# Patient Record
Sex: Female | Born: 1940
Health system: Southern US, Community
[De-identification: ages and names within clinical notes are randomized; demographics above are authoritative.]

## PROBLEM LIST (undated history)

## (undated) DIAGNOSIS — J449 Chronic obstructive pulmonary disease, unspecified: Secondary | ICD-10-CM

## (undated) DIAGNOSIS — I10 Essential (primary) hypertension: Secondary | ICD-10-CM

## (undated) DIAGNOSIS — K219 Gastro-esophageal reflux disease without esophagitis: Secondary | ICD-10-CM

## (undated) HISTORY — PX: CHOLECYSTECTOMY: SHX55

## (undated) HISTORY — DX: Chronic obstructive pulmonary disease, unspecified: J44.9

## (undated) HISTORY — PX: CYSTECTOMY: SUR359

## (undated) HISTORY — PX: KNEE ARTHROSCOPY: SUR90

## (undated) HISTORY — DX: Essential (primary) hypertension: I10

## (undated) HISTORY — DX: Gastro-esophageal reflux disease without esophagitis: K21.9

---

## 2005-03-12 ENCOUNTER — Ambulatory Visit: Payer: Self-pay | Admitting: Family Medicine

## 2006-02-06 ENCOUNTER — Ambulatory Visit: Payer: Self-pay | Admitting: Family Medicine

## 2006-02-13 ENCOUNTER — Ambulatory Visit: Payer: Self-pay | Admitting: Family Medicine

## 2007-03-05 ENCOUNTER — Ambulatory Visit: Payer: Self-pay | Admitting: Family Medicine

## 2011-02-12 ENCOUNTER — Encounter: Payer: Self-pay | Admitting: Pulmonary Disease

## 2011-02-20 ENCOUNTER — Encounter: Payer: Self-pay | Admitting: Pulmonary Disease

## 2011-02-21 ENCOUNTER — Encounter: Payer: Self-pay | Admitting: Pulmonary Disease

## 2011-02-21 ENCOUNTER — Ambulatory Visit (INDEPENDENT_AMBULATORY_CARE_PROVIDER_SITE_OTHER): Payer: Medicare Other | Admitting: Pulmonary Disease

## 2011-02-21 ENCOUNTER — Ambulatory Visit (INDEPENDENT_AMBULATORY_CARE_PROVIDER_SITE_OTHER)
Admission: RE | Admit: 2011-02-21 | Discharge: 2011-02-21 | Disposition: A | Discharge status: Home / Self Care | Payer: Medicare Other | Source: Ambulatory Visit | Attending: Pulmonary Disease | Admitting: Pulmonary Disease

## 2011-02-21 VITALS — BP 120/82 | HR 95 | Temp 97.9°F | Ht 66.0 in | Wt 203.0 lb

## 2011-02-21 DIAGNOSIS — R0609 Other forms of dyspnea: Secondary | ICD-10-CM

## 2011-02-21 DIAGNOSIS — J439 Emphysema, unspecified: Secondary | ICD-10-CM | POA: Insufficient documentation

## 2011-02-21 DIAGNOSIS — R0989 Other specified symptoms and signs involving the circulatory and respiratory systems: Secondary | ICD-10-CM

## 2011-02-21 NOTE — Assessment & Plan Note (Signed)
The pt has worsening doe of unknown origin.  I suspect she does have some element of obstructive lung disease, and will need pfts to establish the diagnosis and the severity.  I also think weight and conditioning are playing roles as well.  If she has very little copd by pfts, will have to consider whether she may have underlying cardiac disease contributing to this.  I have asked her to continue with her current inhaler regimen, and will check cxr today and schedule for pfts.

## 2011-02-21 NOTE — Progress Notes (Signed)
  Subjective:    Patient ID: Danielle Harmon, female    DOB: 06-19-1941, 70 y.o.   MRN: [id number]  HPI The pt is a 70y/o female who I have been asked to see for evaluation of doe.  The pt states she has had breathing issues for greater than 52yrs, but has noted worsening symptoms the past 6mos.  She describes a less than one block doe at moderate pace, and will get winded bringing groceries in from the car and with light housework.  She has minimal cough or congestion, and only mild chronic LE edema.  She has never had a cardiac evaluation in the past.  She has a long history of smoking, but has not done so in 28yrs.  She has not had a recent cxr or pfts.  Her weight has been stable over the last few yrs.  She is currently on spiriva and symbicort, and feels the inhalers have helped.    Review of Systems  Constitutional: Negative for fever and unexpected weight change.  HENT: Positive for sneezing. Negative for ear pain, nosebleeds, congestion, sore throat, rhinorrhea, trouble swallowing, dental problem, postnasal drip and sinus pressure.   Eyes: Negative for redness and itching.  Respiratory: Positive for cough and shortness of breath. Negative for chest tightness and wheezing.   Cardiovascular: Positive for leg swelling. Negative for palpitations.  Gastrointestinal: Negative for nausea and vomiting.  Genitourinary: Negative for dysuria.  Musculoskeletal: Positive for joint swelling.  Skin: Negative for rash.  Neurological: Negative for headaches.  Hematological: Does not bruise/bleed easily.  Psychiatric/Behavioral: Negative for dysphoric mood. The patient is not nervous/anxious.        Objective:   Physical Exam Constitutional:  Obese female, no acute distress  HENT:  Nares patent without discharge  Oropharynx without exudate, palate and uvula are normal  Eyes:  Perrla, eomi, no scleral icterus  Neck:  No JVD, no TMG  Cardiovascular:  Normal rate, regular rhythm, no rubs or gallops.   No murmurs        Intact distal pulses  Pulmonary : decreased  breath sounds, no stridor or respiratory distress   No rales, rhonchi, or wheezing  Abdominal:  Soft, nondistended, bowel sounds present.  No tenderness noted.   Musculoskeletal: mild lower extremity edema noted.  Lymph Nodes:  No cervical lymphadenopathy noted  Skin:  No cyanosis noted  Neurologic:  Alert, appropriate, moves all 4 extremities without obvious deficit.         Assessment & Plan:

## 2011-02-21 NOTE — Patient Instructions (Signed)
Will schedule for breathing tests and see you back on same day to discuss. Will check cxr today and call you with results Continue on current inhalers for now

## 2011-02-24 ENCOUNTER — Encounter: Payer: Self-pay | Admitting: Pulmonary Disease

## 2011-02-26 ENCOUNTER — Encounter (HOSPITAL_COMMUNITY): Payer: Medicare Other

## 2011-03-01 ENCOUNTER — Ambulatory Visit (INDEPENDENT_AMBULATORY_CARE_PROVIDER_SITE_OTHER): Payer: Medicare Other | Admitting: Pulmonary Disease

## 2011-03-01 ENCOUNTER — Ambulatory Visit (HOSPITAL_COMMUNITY)
Admission: RE | Admit: 2011-03-01 | Discharge: 2011-03-01 | Disposition: A | Discharge status: Home / Self Care | Payer: Medicare Other | Source: Ambulatory Visit | Attending: Pulmonary Disease | Admitting: Pulmonary Disease

## 2011-03-01 ENCOUNTER — Encounter: Payer: Self-pay | Admitting: Pulmonary Disease

## 2011-03-01 VITALS — BP 116/74 | HR 86 | Temp 97.6°F | Ht 66.0 in | Wt 200.0 lb

## 2011-03-01 DIAGNOSIS — Z79899 Other long term (current) drug therapy: Secondary | ICD-10-CM | POA: Insufficient documentation

## 2011-03-01 DIAGNOSIS — R0989 Other specified symptoms and signs involving the circulatory and respiratory systems: Secondary | ICD-10-CM | POA: Insufficient documentation

## 2011-03-01 DIAGNOSIS — R0609 Other forms of dyspnea: Secondary | ICD-10-CM | POA: Insufficient documentation

## 2011-03-01 DIAGNOSIS — J988 Other specified respiratory disorders: Secondary | ICD-10-CM | POA: Insufficient documentation

## 2011-03-01 DIAGNOSIS — J449 Chronic obstructive pulmonary disease, unspecified: Secondary | ICD-10-CM

## 2011-03-01 DIAGNOSIS — J438 Other emphysema: Secondary | ICD-10-CM | POA: Insufficient documentation

## 2011-03-01 DIAGNOSIS — R0602 Shortness of breath: Secondary | ICD-10-CM | POA: Insufficient documentation

## 2011-03-01 LAB — PULMONARY FUNCTION TEST

## 2011-03-01 NOTE — Patient Instructions (Signed)
Stay on your current inhaler regimen. Work on weight loss, and I will refer you to the pulmonary rehab program at [facility] followup with me in 4mos, or sooner if having issues.

## 2011-03-01 NOTE — Assessment & Plan Note (Signed)
The pt has severe obstructive disease on her pfts, but at least she has quit smoking.  She is currently on a very good bronchodilator regimen, and I have asked her to continue.  I suspect the key to improving her QOL is weight loss and conditioning.  I have suggested the pulmonary rehab program at [facility] or [place], and she is agreeable.

## 2011-03-01 NOTE — Progress Notes (Signed)
SATURATION QUALIFICATIONS:  Patient Saturations on Room Air at Rest = 94%  Patient Saturations on Room Air while Ambulating = 86%  Patient Saturations on 2 Liters of oxygen while Ambulating = 92% Arman Filter, LPN

## 2011-03-01 NOTE — Progress Notes (Signed)
  Subjective:    Patient ID: Danielle Harmon, female    DOB: May 05, 1941, 70 y.o.   MRN: [id number]  HPI The pt comes in today for f/u of her recent pfts.  She was found to have severe obstructive disease, but no restriction.  She is continuing on her current brochodilator regimen.  She denies cough or congestion currently.     Review of Systems  Constitutional: Negative for fever and unexpected weight change.  HENT: Positive for rhinorrhea. Negative for ear pain, nosebleeds, congestion, sore throat, sneezing, trouble swallowing, dental problem, postnasal drip and sinus pressure.   Eyes: Negative for redness and itching.  Respiratory: Positive for cough and shortness of breath. Negative for chest tightness and wheezing.   Cardiovascular: Negative for palpitations and leg swelling.  Gastrointestinal: Negative for nausea and vomiting.  Genitourinary: Negative for dysuria.  Musculoskeletal: Positive for joint swelling.  Skin: Negative for rash.  Neurological: Negative for headaches.  Hematological: Bruises/bleeds easily.  Psychiatric/Behavioral: Negative for dysphoric mood. The patient is not nervous/anxious.        Objective:   Physical Exam Obese female in nad Chest with decreased bs, no wheezing Cor with rrr LE with mild edema, no cyanosis  Alert, oriented , moves all 4        Assessment & Plan:

## 2011-03-12 ENCOUNTER — Ambulatory Visit: Payer: Medicare Other | Admitting: Pulmonary Disease

## 2011-03-21 ENCOUNTER — Encounter: Payer: Self-pay | Admitting: Pulmonary Disease

## 2011-07-03 ENCOUNTER — Encounter: Payer: Self-pay | Admitting: Pulmonary Disease

## 2011-07-03 ENCOUNTER — Ambulatory Visit (INDEPENDENT_AMBULATORY_CARE_PROVIDER_SITE_OTHER): Payer: Medicare Other | Admitting: Pulmonary Disease

## 2011-07-03 VITALS — BP 118/80 | HR 77 | Temp 97.6°F | Ht 66.0 in | Wt 195.2 lb

## 2011-07-03 DIAGNOSIS — J449 Chronic obstructive pulmonary disease, unspecified: Secondary | ICD-10-CM

## 2011-07-03 NOTE — Assessment & Plan Note (Signed)
The patient is doing very well from a COPD standpoint on her current bronchodilator regimen.  She is participating in pulmonary rehabilitation, and has seen improvement in her functional status.  She has not had a recent acute exacerbation.  She does notice some increase in shortness of breath with activity toward the late afternoon, and thinks it is related to her medication "wearing off".  I have told her that she can take her symbicort at that time, and that she does not have to wait the full 12 hours.  I have asked her to continue working on her exercise program, and to work aggressively on weight loss.

## 2011-07-03 NOTE — Patient Instructions (Signed)
Stay on current medications Continue with exercise program, work on weight loss followup with me in 6mos.

## 2011-07-03 NOTE — Progress Notes (Signed)
  Subjective:    Patient ID: Danielle Harmon, female    DOB: 1941-07-05, 70 y.o.   MRN: [id number]  HPI The patient comes in today for followup of her known COPD.  She has been compliant with her medications, and has been participating in the pulmonary rehabilitation program.  She denies any significant cough or mucus production, and overall feels that her functional capacity has improved greatly.  She has been monitored closely during her rehabilitation sessions, and has not required oxygen with exertion.  She has received her flu shot already this fall.   Review of Systems  Constitutional: Negative for fever and unexpected weight change.  HENT: Negative for ear pain, nosebleeds, congestion, sore throat, rhinorrhea, sneezing, trouble swallowing, dental problem, postnasal drip and sinus pressure.   Eyes: Positive for itching. Negative for redness.  Respiratory: Positive for cough and shortness of breath. Negative for chest tightness and wheezing.   Cardiovascular: Positive for leg swelling. Negative for palpitations.  Gastrointestinal: Negative for nausea and vomiting.  Genitourinary: Negative for dysuria.  Musculoskeletal: Negative for joint swelling.  Skin: Negative for rash.  Neurological: Negative for headaches.  Hematological: Bruises/bleeds easily.  Psychiatric/Behavioral: Negative for dysphoric mood. The patient is not nervous/anxious.        Objective:   Physical Exam Obese female in nad Nose without purulence or discharge Chest with clear bs, no wheezing Cor with rrr LE with mild edema, no cyanosis Alert, oriented, moves all 4        Assessment & Plan:

## 2012-01-02 ENCOUNTER — Ambulatory Visit: Payer: Medicare Other | Admitting: Pulmonary Disease

## 2013-09-14 ENCOUNTER — Ambulatory Visit (INDEPENDENT_AMBULATORY_CARE_PROVIDER_SITE_OTHER): Payer: Medicare Other | Admitting: Pulmonary Disease

## 2013-09-14 ENCOUNTER — Encounter: Payer: Self-pay | Admitting: Pulmonary Disease

## 2013-09-14 VITALS — BP 142/78 | HR 93

## 2013-09-14 DIAGNOSIS — J449 Chronic obstructive pulmonary disease, unspecified: Secondary | ICD-10-CM

## 2013-09-14 MED ORDER — PREDNISONE 10 MG PO TABS: ORAL_TABLET | ORAL | Status: DC

## 2013-09-14 NOTE — Assessment & Plan Note (Signed)
The patient has seen a decline in her breathing since her last visit in 2012, despite being on a very good medical regimen. I think some of this is underlying deconditioning and weight, and I've discussed with her returning to pulmonary rehabilitation. She will give this consideration. She has not returned to baseline since an episode of what sounds like bronchitis around Thanksgiving. Will give her a short course of prednisone to see if this will help return her to a reasonable status. I've also discussed with her changing her maintenance inhalers over to nebulized medications. She is not ready to commit the time to this.

## 2013-09-14 NOTE — Addendum Note (Signed)
Addended by: Maisie Fus on: 09/14/2013 02:49 PM   Modules accepted: Orders

## 2013-09-14 NOTE — Progress Notes (Signed)
   Subjective:    Patient ID: Danielle Harmon, female    DOB: November 19, 1940, 73 y.o.   MRN: [id number]  HPI Patient comes in today for followup of her known severe COPD. She has not been seen in over 2 years, and is now on supplemental oxygen after an ER visit in 2013. She is maintained on her bronchodilator regimen, but her insurance is changing and will no longer cover Symbicort. Elwin Sleight is an option for her. She tells me that she had an episode around Thanksgiving of what sounds like bronchitis, and she has never returned to baseline. She has not coughing consistently, nor is she congested. Her exertional tolerance continues to be poor, and admits to having deconditioning. She has also been having lower extremity edema which is intermittent in nature.   Review of Systems  Constitutional: Negative for fever and unexpected weight change.  HENT: Negative for congestion, dental problem, ear pain, nosebleeds, postnasal drip, rhinorrhea, sinus pressure, sneezing, sore throat and trouble swallowing.   Eyes: Negative for redness and itching.  Respiratory: Positive for shortness of breath. Negative for cough, chest tightness and wheezing.   Cardiovascular: Negative for palpitations and leg swelling.  Gastrointestinal: Negative for nausea and vomiting.  Genitourinary: Negative for dysuria.  Musculoskeletal: Negative for joint swelling.  Skin: Negative for rash.  Neurological: Negative for headaches.  Hematological: Does not bruise/bleed easily.  Psychiatric/Behavioral: Negative for dysphoric mood. The patient is not nervous/anxious.        Objective:   Physical Exam Well-developed female in no acute distress Nose without purulence or discharge noted Neck without lymphadenopathy or thyromegaly Chest with very decreased breath sounds, a few rhonchi, no active wheezing Cardiac exam with regular rate and rhythm Lower extremities with 1+ edema, no cyanosis Alert and oriented, moves all 4  extremities.       Assessment & Plan:

## 2013-09-14 NOTE — Patient Instructions (Signed)
Will change your symbicort to dulera 100/5 after the first of the year.  Take sample 2 puffs am and pm everyday, and keep mouth rinsed.  If you tolerate, call us and we can send in prescription. Will treat with a short course of prednisone since you have not recovered from your recent respiratory infection. Think about referral to pulmonary rehab program again at [facility] or [place].  You did well the last time. Stay as active as possible. Keep up with your swelling in legs, and let primary know if getting worse.  followup with me again in 6mos.

## 2013-10-11 ENCOUNTER — Telehealth: Payer: Self-pay | Admitting: Pulmonary Disease

## 2013-10-11 MED ORDER — MOMETASONE FURO-FORMOTEROL FUM 100-5 MCG/ACT IN AERO: 2.0000 | INHALATION_SPRAY | Freq: Two times a day (BID) | RESPIRATORY_TRACT | Status: DC

## 2013-10-11 NOTE — Telephone Encounter (Signed)
Pt last seen 12.23.14 w/ KC: Patient Instructions     Will change your symbicort to dulera 100/5 after the first of the year. Take sample 2 puffs am and pm everyday, and keep mouth rinsed. If you tolerate, call us and we can send in prescription.  Will treat with a short course of prednisone since you have not recovered from your recent respiratory infection.  Think about referral to pulmonary rehab program again at [facility] or [place]. You did well the last time.  Stay as active as possible.  Keep up with your swelling in legs, and let primary know if getting worse.  followup with me again in 6mos.    Called spoke with patient who reported the Elwin SleightDulera is working well for her.  Pt denies any dyspnea, chest tightness, congestion, thrush, hoarseness.  Pt is requesting a 90day supply of Dulera to be [facility] [place].  Rx sent, pt aware to keep follow up in May/June.  Nothing further needed; will sign off.

## 2014-03-15 ENCOUNTER — Ambulatory Visit: Payer: Medicare Other | Admitting: Pulmonary Disease

## 2014-03-18 ENCOUNTER — Encounter: Payer: Self-pay | Admitting: Pulmonary Disease

## 2014-03-18 ENCOUNTER — Ambulatory Visit (INDEPENDENT_AMBULATORY_CARE_PROVIDER_SITE_OTHER): Payer: Medicare Other | Admitting: Pulmonary Disease

## 2014-03-18 VITALS — BP 122/80 | HR 83 | Temp 97.6°F | Ht 66.0 in | Wt 200.2 lb

## 2014-03-18 DIAGNOSIS — J438 Other emphysema: Secondary | ICD-10-CM

## 2014-03-18 NOTE — Assessment & Plan Note (Signed)
The patient is staying on her maintenance bronchodilator regimen, and feels that her breathing is little better than the last visit. She has not had any recent pulmonary infection or acute exacerbation. I have asked her to continue on her current meds, work on weight loss and conditioning, and to consider referral to the pulmonary rehabilitation program locally. I also stressed to her the importance of staying on her oxygen while sleeping and with exertional activities.

## 2014-03-18 NOTE — Patient Instructions (Signed)
Continue on current medications Stay as active as possible, and consider referral to the rehab program at [facility] Stay on oxygen with exertion and sleep.  followup with me again in 6mos

## 2014-03-18 NOTE — Progress Notes (Signed)
   Subjective:    Patient ID: Danielle Harmon, female    DOB: 1941-01-25, 73 y.o.   MRN: [id number]  HPI The patient comes in today for followup of her known COPD with chronic respiratory failure. She is using her maintenance inhalers consistently, and feels that her breathing has improved since last visit. She has had no pulmonary infection, nor has she had an acute exacerbation since last visit. She is staying on her oxygen compliantly at night, but not necessarily during the day. I have encouraged her to stay on this with exertional activities.   Review of Systems  Constitutional: Negative for fever and unexpected weight change.  HENT: Negative for congestion, dental problem, ear pain, nosebleeds, postnasal drip, rhinorrhea, sinus pressure, sneezing, sore throat and trouble swallowing.   Eyes: Negative for redness and itching.  Respiratory: Positive for cough and shortness of breath. Negative for chest tightness and wheezing.   Cardiovascular: Negative for palpitations and leg swelling.  Gastrointestinal: Negative for nausea and vomiting.  Genitourinary: Negative for dysuria.  Musculoskeletal: Negative for joint swelling.  Skin: Negative for rash.  Neurological: Negative for headaches.  Hematological: Does not bruise/bleed easily.  Psychiatric/Behavioral: Negative for dysphoric mood. The patient is not nervous/anxious.        Objective:   Physical Exam Obese female in no acute distress Nose without purulence or discharge noted Neck without lymphadenopathy or thyromegaly Chest with decreased breath sounds, no active wheezes or rhonchi Cardiac exam with regular rate and rhythm Lower extremities with 1-2+ edema, no cyanosis Alert and oriented, moves all 4 extremities.       Assessment & Plan:

## 2014-04-10 ENCOUNTER — Other Ambulatory Visit: Payer: Self-pay | Admitting: Pulmonary Disease

## 2014-06-01 DIAGNOSIS — K219 Gastro-esophageal reflux disease without esophagitis: Secondary | ICD-10-CM | POA: Insufficient documentation

## 2014-07-01 ENCOUNTER — Other Ambulatory Visit: Payer: Self-pay | Admitting: *Deleted

## 2014-07-01 MED ORDER — MOMETASONE FURO-FORMOTEROL FUM 100-5 MCG/ACT IN AERO: INHALATION_SPRAY | RESPIRATORY_TRACT | Status: DC

## 2014-07-04 ENCOUNTER — Telehealth: Payer: Self-pay | Admitting: Pulmonary Disease

## 2014-07-04 MED ORDER — MOMETASONE FURO-FORMOTEROL FUM 100-5 MCG/ACT IN AERO: INHALATION_SPRAY | RESPIRATORY_TRACT | Status: DC

## 2014-07-04 NOTE — Telephone Encounter (Signed)
Called pt. She needs [facility]. RX has been sent. Nothing further needed

## 2014-09-19 ENCOUNTER — Ambulatory Visit: Payer: Medicare Other | Admitting: Pulmonary Disease

## 2014-09-27 ENCOUNTER — Encounter: Payer: Self-pay | Admitting: Pulmonary Disease

## 2014-09-27 ENCOUNTER — Ambulatory Visit (INDEPENDENT_AMBULATORY_CARE_PROVIDER_SITE_OTHER): Payer: Medicare Other | Admitting: Pulmonary Disease

## 2014-09-27 VITALS — BP 152/86 | HR 87 | Temp 97.6°F | Ht 66.0 in | Wt 199.2 lb

## 2014-09-27 DIAGNOSIS — J438 Other emphysema: Secondary | ICD-10-CM

## 2014-09-27 MED ORDER — ALBUTEROL SULFATE (2.5 MG/3ML) 0.083% IN NEBU: 2.5000 mg | INHALATION_SOLUTION | Freq: Four times a day (QID) | RESPIRATORY_TRACT | Status: DC | PRN

## 2014-09-27 NOTE — Assessment & Plan Note (Signed)
The patient seems to be doing fairly well from a COPD standpoint. She is staying on her bronchodilator regimen, and although she did have a recent chest cold, it responded well to antibiotics with no complications. She feels that she is back to baseline. I have asked her to stay on her current medications as well as oxygen, and to stay as active as possible. She tells me they are opening some kind of rehabilitation program and her home city, and I am happy to refer her when this becomes available. She is not able to travel to [place] for formal pulmonary rehabilitation.

## 2014-09-27 NOTE — Progress Notes (Deleted)
   Subjective:    Patient ID: Danielle Harmon, female    DOB: 1940/10/28, 74 y.o.   MRN: [id number]  HPI    Review of Systems  Constitutional: Negative for fever and unexpected weight change.  HENT: Negative for congestion, dental problem, ear pain, nosebleeds, postnasal drip, rhinorrhea, sinus pressure, sneezing, sore throat and trouble swallowing.   Eyes: Negative for redness and itching.  Respiratory: Negative for cough, chest tightness, shortness of breath and wheezing.   Cardiovascular: Negative for palpitations and leg swelling.  Gastrointestinal: Negative for nausea and vomiting.  Genitourinary: Negative for dysuria.  Musculoskeletal: Negative for joint swelling.  Skin: Negative for rash.  Neurological: Negative for headaches.  Hematological: Does not bruise/bleed easily.  Psychiatric/Behavioral: Negative for dysphoric mood. The patient is not nervous/anxious.        Objective:   Physical Exam        Assessment & Plan:

## 2014-09-27 NOTE — Patient Instructions (Signed)
No change in breathing medications Stay on oxygen Try to stay as active as possible. Will send in prescription for your albuterol solution for your nebulizer machine.   followup with me again in 6mos

## 2014-09-27 NOTE — Progress Notes (Signed)
   Subjective:    Patient ID: Danielle Harmon, female    DOB: [birth year], 74 y.o.   MRN: [id number]  HPI Patient comes in today for follow-up of her known COPD with chronic respiratory failure. She is staying on her bronchodilator regimen compliantly, as well as her oxygen. She did have a recent chest cold, but this resolved very quickly with antibiotics from her primary care physician. She feels that her breathing is at her usual baseline.   Review of Systems  Constitutional: Negative for fever and unexpected weight change.  HENT: Negative for congestion, dental problem, ear pain, nosebleeds, postnasal drip, rhinorrhea, sinus pressure, sneezing, sore throat and trouble swallowing.   Eyes: Negative for redness and itching.  Respiratory: Negative for cough, chest tightness, shortness of breath and wheezing.   Cardiovascular: Negative for palpitations and leg swelling.  Gastrointestinal: Negative for nausea and vomiting.  Genitourinary: Negative for dysuria.  Musculoskeletal: Negative for joint swelling.  Skin: Negative for rash.  Neurological: Negative for headaches.  Hematological: Does not bruise/bleed easily.  Psychiatric/Behavioral: Negative for dysphoric mood. The patient is not nervous/anxious.        Objective:   Physical Exam Well-developed female in no acute distress Nose without purulence or discharge noted Neck without lymphadenopathy or thyromegaly Chest with decreased breath sounds, no active wheezing Cardiac exam with regular rate and rhythm Lower extremities with 1-2+ edema, no cyanosis Alert and oriented, moves all 4 extremities.       Assessment & Plan:

## 2015-03-02 ENCOUNTER — Ambulatory Visit (INDEPENDENT_AMBULATORY_CARE_PROVIDER_SITE_OTHER): Payer: Medicare Other | Admitting: Pulmonary Disease

## 2015-03-02 ENCOUNTER — Encounter: Payer: Self-pay | Admitting: Pulmonary Disease

## 2015-03-02 VITALS — BP 124/72 | HR 73 | Temp 97.2°F | Ht 66.0 in | Wt 199.6 lb

## 2015-03-02 DIAGNOSIS — J438 Other emphysema: Secondary | ICD-10-CM | POA: Diagnosis not present

## 2015-03-02 NOTE — Progress Notes (Signed)
   Subjective:    Patient ID: Danielle Harmon, female    DOB: 07-03-1941, 74 y.o.   MRN: [id number]  HPI The patient comes in today for follow-up of her known severe COPD with chronic respiratory failure. She is staying on her bronchodilator regimen, and has not had a recent acute exacerbation. She feels that her breathing is stable from the last visit, and has not had to use her rescue inhaler excessively. She denies any chest congestion or purulent mucus.   Review of Systems  Constitutional: Negative for fever and unexpected weight change.  HENT: Negative for congestion, dental problem, ear pain, nosebleeds, postnasal drip, rhinorrhea, sinus pressure, sneezing, sore throat and trouble swallowing.   Eyes: Negative for redness and itching.  Respiratory: Positive for cough and shortness of breath. Negative for chest tightness and wheezing.   Cardiovascular: Negative for palpitations and leg swelling.  Gastrointestinal: Negative for nausea and vomiting.  Genitourinary: Negative for dysuria.  Musculoskeletal: Negative for joint swelling.  Skin: Negative for rash.  Neurological: Negative for headaches.  Hematological: Does not bruise/bleed easily.  Psychiatric/Behavioral: Negative for dysphoric mood. The patient is not nervous/anxious.        Objective:   Physical Exam Well-developed female in no acute distress Nose without purulence or discharge noted Neck without lymphadenopathy or thyromegaly Chest with decreased breath sounds, no active wheezing Cardiac exam with regular rate and rhythm Lower extremities with mild edema, no cyanosis Alert and oriented, moves all 4 extremities.       Assessment & Plan:

## 2015-03-02 NOTE — Assessment & Plan Note (Signed)
The patient appears to be stable from a COPD standpoint on her current bronchodilator regimen and oxygen. She has not had a recent acute exacerbation or pulmonary infection, and feels that her exertional tolerance is at baseline. I have asked her to continue on her current medications, and to try and stay as active as possible.

## 2015-03-02 NOTE — Patient Instructions (Signed)
No change in breathing medications. Continue to stay on oxygen, and work on as much activity as possible followup with Dr. Delton Coombes in 14mos.

## 2015-03-28 ENCOUNTER — Ambulatory Visit: Payer: Medicare Other | Admitting: Pulmonary Disease

## 2015-10-10 ENCOUNTER — Ambulatory Visit (INDEPENDENT_AMBULATORY_CARE_PROVIDER_SITE_OTHER): Payer: Medicare Other | Admitting: Emergency Medicine

## 2015-10-10 ENCOUNTER — Encounter: Payer: Self-pay | Admitting: Emergency Medicine

## 2015-10-10 VITALS — BP 118/82 | HR 79 | Ht 66.0 in | Wt 199.0 lb

## 2015-10-10 DIAGNOSIS — Z23 Encounter for immunization: Secondary | ICD-10-CM

## 2015-10-10 DIAGNOSIS — J438 Other emphysema: Secondary | ICD-10-CM | POA: Diagnosis not present

## 2015-10-10 NOTE — Patient Instructions (Signed)
Please continue your inhaled meds as you have been taking them  We will give you Prevnar 13 immunization today Wear your oxygen with all exertion and at night.  Follow with Dr Delton Coombes in 6 months or sooner if you have any problems

## 2015-10-10 NOTE — Assessment & Plan Note (Signed)
Please continue your inhaled meds as you have been taking them  We will give you Prevnar 13 immunization today Wear your oxygen with all exertion and at night.  Follow with Dr Bryannah Boston in 6 months or sooner if you have any problems  

## 2015-10-10 NOTE — Addendum Note (Signed)
Addended by: Jaynee Eagles C on: 10/10/2015 11:31 AM   Modules accepted: Orders

## 2015-10-10 NOTE — Progress Notes (Signed)
Subjective:    Patient ID: Danielle Harmon, female    DOB: [birth year], 75 y.o.   MRN: [id number]  HPI 75 year old woman, former smoker (40 pack years), with a history of hypertension. She has been followed by Dr. Chip Boer for COPD. I personally reviewed her pulmonary function testing from 2012 that showed a severe obstruction with an FEV1 of 0.85 L (34% predicted). She has been managed on Spiriva, dulera and albuterol when necessary. She has oxygen that she uses 2L/min with exertion, is able to rest on RA. She sleeps with it.  Has tolerated her meds.  She feels that her breathing has been stable. She is able to shop, get out, lives alone.  No wheeze, minimal cough with clear phlegm. Up to date flu shot.  has had pneumovax, never prevnar.    Review of Systems As per HPI  Past Medical History  Diagnosis Date  . Hypertension      Family History  Problem Relation Age of Onset  . Emphysema Father      Social History   Social History  . Marital Status: Married    Spouse Name: Danielle Harmon  . Number of Children: Y  . Years of Education: N/A   Occupational History  . school teacher     retired   Social History Main Topics  . Smoking status: Former Smoker -- 1.00 packs/day for 40 years    Types: Cigarettes    Quit date: 09/23/2002  . Smokeless tobacco: Not on file  . Alcohol Use: Not on file  . Drug Use: Not on file  . Sexual Activity: Not on file   Other Topics Concern  . Not on file   Social History Narrative     Allergies  Allergen Reactions  . Bee Venom Anaphylaxis     Outpatient Prescriptions Prior to Visit  Medication Sig Dispense Refill  . albuterol (PROAIR HFA) 108 (90 BASE) MCG/ACT inhaler Inhale 2 puffs into the lungs every 6 (six) hours as needed.      Marland Kitchen albuterol (PROVENTIL) (2.5 MG/3ML) 0.083% nebulizer solution Take 3 mLs (2.5 mg total) by nebulization every 6 (six) hours as needed for wheezing or shortness of breath. DX J43.8 75 mL 0  . amLODipine  (NORVASC) 5 MG tablet Take 5 mg by mouth daily.      Marland Kitchen guaiFENesin (MUCINEX) 600 MG 12 hr tablet Take 600 mg by mouth as needed.     . hydrochlorothiazide 25 MG tablet Take 25 mg by mouth daily.      . mometasone-formoterol (DULERA) 100-5 MCG/ACT AERO INHALE 2 PUFFS INTO THE LUNGS 2 (TWO) TIMES DAILY. 3 Inhaler 3  . tiotropium (SPIRIVA) 18 MCG inhalation capsule Place 18 mcg into inhaler and inhale daily.       No facility-administered medications prior to visit.          Objective:   Physical Exam  Filed Vitals:   10/10/15 1110  BP: 118/82  Pulse: 79  Height:  (1.676 m)  Weight: 199 lb (90.266 kg)  SpO2: 99%   Gen: Pleasant, well-nourished, in no distress,  normal affect  ENT: No lesions,  mouth clear,  oropharynx clear, no postnasal drip  Neck: No JVD, no TMG, no carotid bruits  Lungs: No use of accessory muscles, clear without rales or rhonchi  Cardiovascular: RRR, heart sounds normal, no murmur or gallops, no peripheral edema  Musculoskeletal: No deformities, no cyanosis or clubbing  Neuro: alert, non focal  Skin: Warm, no  lesions or rashes          Assessment & Plan:  COPD (chronic obstructive pulmonary disease) with emphysema Please continue your inhaled meds as you have been taking them  We will give you Prevnar 13 immunization today Wear your oxygen with all exertion and at night.  Follow with Dr Delton Coombes in 6 months or sooner if you have any problems

## 2016-04-18 ENCOUNTER — Encounter: Payer: Self-pay | Admitting: Emergency Medicine

## 2016-04-18 ENCOUNTER — Ambulatory Visit (INDEPENDENT_AMBULATORY_CARE_PROVIDER_SITE_OTHER): Payer: Medicare Other | Admitting: Emergency Medicine

## 2016-04-18 DIAGNOSIS — J438 Other emphysema: Secondary | ICD-10-CM

## 2016-04-18 NOTE — Progress Notes (Signed)
   Subjective:    Patient ID: Danielle Harmon, female    DOB: [birth year], 75 y.o.   MRN: [id number]  HPI 75 year-old woman, former smoker (40 pack years), with a history of hypertension. She has been followed by Dr. Chip Boer for COPD. I personally reviewed her pulmonary function testing from 2012 that showed a severe obstruction with an FEV1 of 0.85 L (34% predicted). She has been managed on Spiriva, dulera and albuterol when necessary. She has oxygen that she uses 2L/min with exertion, is able to rest on RA. She sleeps with it.  Has tolerated her meds.  She feels that her breathing has been stable. She is able to shop, get out, lives alone.  No wheeze, minimal cough with clear phlegm. Up to date flu shot.  has had pneumovax, never prevnar.   ROV 04/18/16 -- Patient follows up for her history of severe COPD with associated chronic hypoxemic respiratory failure. She has been doing fair;ly well. She tries to go without her O2 when she sits still. She notes she is able to walk room to room. She is on 2L/min pulsed currently and w most exertion.  She is on dulera and spiriva. Has albuterol occasionally, not every day, a few times a month. Rare cough, does have to clear some mucous a few times a day. Occasional wheeze. Her functional capacity is pretty good - able to shop for groceries. No flares.   Review of Systems As per HPI      Objective:   Physical Exam  Vitals:   04/18/16 0854  BP: 138/88  Pulse: 80  SpO2: 92%  Weight: 202 lb (91.6 kg)  Height: 5\' 6"  (1.676 m)   Gen: Pleasant, well-nourished, in no distress,  normal affect  ENT: No lesions,  mouth clear,  oropharynx clear, no postnasal drip  Neck: No JVD, no TMG, no carotid bruits  Lungs: No use of accessory muscles, clear without rales or rhonchi  Cardiovascular: RRR, heart sounds normal, no murmur or gallops, no peripheral edema  Musculoskeletal: No deformities, no cyanosis or clubbing  Neuro: alert, non focal  Skin: Warm, no  lesions or rashes          Assessment & Plan:  No problem-specific Assessment & Plan notes found for this encounter.

## 2016-04-18 NOTE — Patient Instructions (Signed)
Please continue your Spiriva, Dulera and oxygen as you are using them  Take albuterol 2 puffs up to every 4 hours if needed for shortness of breath.  Get the flu shot in the Fall Follow with Dr Meyah Corle in 6 months or sooner if you have any problems 

## 2016-04-18 NOTE — Assessment & Plan Note (Signed)
Please continue your Spiriva, Dulera and oxygen as you are using them  Take albuterol 2 puffs up to every 4 hours if needed for shortness of breath.  Get the flu shot in the Fall Follow with Dr Delton Coombes in 6 months or sooner if you have any problems

## 2016-06-03 DIAGNOSIS — J309 Allergic rhinitis, unspecified: Secondary | ICD-10-CM | POA: Insufficient documentation

## 2016-10-17 ENCOUNTER — Encounter: Payer: Self-pay | Admitting: Emergency Medicine

## 2016-10-17 ENCOUNTER — Ambulatory Visit (INDEPENDENT_AMBULATORY_CARE_PROVIDER_SITE_OTHER): Payer: Medicare Other | Admitting: Emergency Medicine

## 2016-10-17 DIAGNOSIS — J438 Other emphysema: Secondary | ICD-10-CM

## 2016-10-17 MED ORDER — PREDNISONE 10 MG PO TABS: ORAL_TABLET | ORAL | 0 refills | Status: DC

## 2016-10-17 MED ORDER — AZITHROMYCIN 250 MG PO TABS: ORAL_TABLET | ORAL | 0 refills | Status: AC

## 2016-10-17 NOTE — Progress Notes (Signed)
Subjective:    Patient ID: Danielle Harmon, female    DOB: 01-11-1941, 76 y.o.   MRN: [id number]  HPI 76 year-old woman, former smoker (40 pack years), with a history of hypertension. She has been followed by Dr. Chip Boer;lance for COPD. I personally reviewed her pulmonary function testing from 2012 that showed a severe obstruction with an FEV1 of 0.85 L (34% predicted). She has been managed on Spiriva, dulera and albuterol when necessary. She has oxygen that she uses 2L/min with exertion, is able to rest on RA. She sleeps with it.  Has tolerated her meds.  She feels that her breathing has been stable. She is able to shop, get out, lives alone.  No wheeze, minimal cough with clear phlegm. Up to date flu shot.  has had pneumovax, never prevnar.   ROV 04/18/16 -- Patient follows up for her history of severe COPD with associated chronic hypoxemic respiratory failure. She has been doing fair;ly well. She tries to go without her O2 when she sits still. She notes she is able to walk room to room. She is on 2L/min pulsed currently and w most exertion.  She is on dulera and spiriva. Has albuterol occasionally, not every day, a few times a month. Rare cough, does have to clear some mucous a few times a day. Occasional wheeze. Her functional capacity is pretty good - able to shop for groceries. No flares.   ROV 10/17/16 -- regular follow up visit for severe COPD with associated hypoxemia. Currently maintained on Spiriva and Symbicort. No exacerbations or pred / abx since last time. She has been well until last couple days. Has had more cough and clear / white mucous. No fever, no change in her baseline SOB, feels that she might be catching URI - feels a weakness.    Review of Systems As per HPI      Objective:   Physical Exam  Vitals:   10/17/16 1016  BP: (!) 140/92  Pulse: 87  SpO2: 94%  Weight: 200 lb (90.7 kg)  Height: 5\' 6"  (1.676 m)   Gen: Pleasant, well-nourished, in no distress,  normal affect  ENT:  No lesions,  mouth clear,  oropharynx clear, no postnasal drip  Neck: No JVD, no TMG, no carotid bruits  Lungs: No use of accessory muscles, clear without rales or rhonchi  Cardiovascular: RRR, heart sounds normal, no murmur or gallops, no peripheral edema  Musculoskeletal: No deformities, no cyanosis or clubbing  Neuro: alert, non focal  Skin: Warm, no lesions or rashes      Assessment & Plan:  COPD (chronic obstructive pulmonary disease) with emphysema Had been doing well, but currently feels that she may be coming down w URI. No evidence AE yet but she is at risk. Gave her instructions on when to start pred and azithro. Flu shot up to date.   We will send prescriptions for prednisone taper and azithromycin to your pharmacy. Please fill these and start taking if you develop a flare of your COPD - watch for increased wheeze, shortness of breath, cough or a change in th eamount or color of your mucous.  Call our office to let us know if you have to start the medications above.  Continue your oxygen, symbicort and spiriva as you are taking them Follow with Dr Delton CoombesByrum in 6 months or sooner if you have any problems  Levy Pupaobert Marcella Dunnaway, MD, PhD 10/17/2016, 10:46 AM [facility] [phone] or if no answer [phone]

## 2016-10-17 NOTE — Patient Instructions (Signed)
We will send prescriptions for prednisone taper and azithromycin to your pharmacy. Please fill these and start taking if you develop a flare of your COPD - watch for increased wheeze, shortness of breath, cough or a change in th eamount or color of your mucous.  Call our office to let us know if you have to start the medications above.  Continue your oxygen, symbicort and spiriva as you are taking them Follow with Dr Delton CoombesByrum in 6 months or sooner if you have any problems

## 2016-10-17 NOTE — Assessment & Plan Note (Signed)
Had been doing well, but currently feels that she may be coming down w URI. No evidence AE yet but she is at risk. Gave her instructions on when to start pred and azithro. Flu shot up to date.   We will send prescriptions for prednisone taper and azithromycin to your pharmacy. Please fill these and start taking if you develop a flare of your COPD - watch for increased wheeze, shortness of breath, cough or a change in th eamount or color of your mucous.  Call our office to let us know if you have to start the medications above.  Continue your oxygen, symbicort and spiriva as you are taking them Follow with Dr Delton CoombesByrum in 6 months or sooner if you have any problems

## 2017-04-15 ENCOUNTER — Ambulatory Visit (INDEPENDENT_AMBULATORY_CARE_PROVIDER_SITE_OTHER): Payer: Medicare Other | Admitting: Emergency Medicine

## 2017-04-15 ENCOUNTER — Encounter: Payer: Self-pay | Admitting: Emergency Medicine

## 2017-04-15 DIAGNOSIS — J438 Other emphysema: Secondary | ICD-10-CM | POA: Diagnosis not present

## 2017-04-15 DIAGNOSIS — R490 Dysphonia: Secondary | ICD-10-CM | POA: Diagnosis not present

## 2017-04-15 NOTE — Progress Notes (Signed)
Subjective:    Patient ID: Danielle Harmon, female    DOB: [birth year], 76 y.o.   MRN: [id number]  HPI [age] year-old woman, former smoker (40 pack years), with a history of hypertension. She has been followed by Dr. Chip Boer;lance for COPD. I personally reviewed her pulmonary function testing from 2012 that showed a severe obstruction with an FEV1 of 0.85 L (34% predicted). She has been managed on Spiriva, dulera and albuterol when necessary. She has oxygen that she uses 2L/min with exertion, is able to rest on RA. She sleeps with it.  Has tolerated her meds.  She feels that her breathing has been stable. She is able to shop, get out, lives alone.  No wheeze, minimal cough with clear phlegm. Up to date flu shot.  has had pneumovax, never prevnar.   ROV 04/18/16 -- Patient follows up for her history of severe COPD with associated chronic hypoxemic respiratory failure. She has been doing fair;ly well. She tries to go without her O2 when she sits still. She notes she is able to walk room to room. She is on 2L/min pulsed currently and w most exertion.  She is on dulera and spiriva. Has albuterol occasionally, not every day, a few times a month. Rare cough, does have to clear some mucous a few times a day. Occasional wheeze. Her functional capacity is pretty good - able to shop for groceries. No flares.   ROV 10/17/16 -- regular follow up visit for severe COPD with associated hypoxemia. Currently maintained on Spiriva and Symbicort. No exacerbations or pred / abx since last time. She has been well until last couple days. Has had more cough and clear / white mucous. No fever, no change in her baseline SOB, feels that she might be catching URI - feels a weakness.   ROV 04/15/17 -- Patient has a history of severe COPD, severe obstruction on pulmonary function testing in 2012, associated chronic hypoxemic respiratory failure. She reports today that since our last visit she has experienced more problems > more exertional  dyspnea, worst in the pm's. She is on Spiriva and Symbicort (preferred CarawayDulera, changed for insurance). When I saw her 6 months ago I treated her for an acute flare but she didn't take. Then 2 weeks later she was treated with steroids by her PCP. She is having cough but not as productive as she would like (on mucinex). She uses albuterol several times a week. She denies nasal gtt. She has a raspy voice.    Review of Systems As per HPI      Objective:   Physical Exam  Vitals:   04/15/17 1051  BP: (!) 132/94  Pulse: 91  SpO2: 98%  Weight: 198 lb (89.8 kg)  Height: 5\' 6"  (1.676 m)   Gen: Pleasant, well-nourished, in no distress,  normal affect  ENT: No lesions,  mouth clear,  oropharynx clear, no postnasal drip  Neck: No JVD, hoarse voice, very mild inspiratory and expiratory stridor  Lungs: No use of accessory muscles, good air movement, no overt wheezing, some referred upper airway noise  Cardiovascular: RRR, heart sounds normal, no murmur or gallops, no peripheral edema  Musculoskeletal: No deformities, no cyanosis or clubbing  Neuro: alert, non focal  Skin: Warm, no lesions or rashes      Assessment & Plan:  Hoarseness Continue Mucinex. She denies nasal drip. Consider addition of an empiric nasal steroid at some point in the future. Also consider evaluation for GERD.  COPD (chronic obstructive pulmonary  disease) with emphysema Please stop Symbicort and Spiriva temporarily.  Please start Stiolto 2 puffs once a day. Please call our office and let us know if you prefer this. If so then we can order through your pharmacy.  Take albuterol 2 puffs up to every 4 hours if needed for shortness of breath.  Wear your oxygen at all times.  Get the flu shot in the fall Follow with Dr Delton Coombes in 3 months or sooner if you have any problems.  Danielle Pupa, MD, PhD 04/15/2017, 11:14 AM [facility] [phone] or if no answer [phone]

## 2017-04-15 NOTE — Assessment & Plan Note (Signed)
Continue Mucinex. She denies nasal drip. Consider addition of an empiric nasal steroid at some point in the future. Also consider evaluation for GERD.

## 2017-04-15 NOTE — Patient Instructions (Signed)
Please stop Symbicort and Spiriva temporarily.  Please start Stiolto 2 puffs once a day. Please call our office and let us know if you prefer this. If so then we can order through your pharmacy.  Take albuterol 2 puffs up to every 4 hours if needed for shortness of breath.  Wear your oxygen at all times.  Get the flu shot in the fall Follow with Dr Byrum in 3 months or sooner if you have any problems. 

## 2017-04-15 NOTE — Assessment & Plan Note (Signed)
Please stop Symbicort and Spiriva temporarily.  Please start Stiolto 2 puffs once a day. Please call our office and let us know if you prefer this. If so then we can order through your pharmacy.  Take albuterol 2 puffs up to every 4 hours if needed for shortness of breath.  Wear your oxygen at all times.  Get the flu shot in the fall Follow with Dr Delton CoombesByrum in 3 months or sooner if you have any problems.

## 2017-04-30 ENCOUNTER — Telehealth: Payer: Self-pay | Admitting: Emergency Medicine

## 2017-04-30 NOTE — Telephone Encounter (Signed)
Agree with changing her back to her original regimen

## 2017-04-30 NOTE — Telephone Encounter (Signed)
Spoke with pt. She is aware of RB's recommendation. Nothing further was needed. 

## 2017-04-30 NOTE — Telephone Encounter (Signed)
Called and spoke with pt. Pt states she does not feel that Stiolto 2.5 is effective. Pt states she feels that her breathing has slightly declined since starting this medication. Pt is requesting to switch back to Symbicort 160 and Spiriva 18mcg. Medication change was made during OV with RB on 04/15/17.   RB please advise. Thanks.

## 2017-07-17 ENCOUNTER — Encounter: Payer: Self-pay | Admitting: Emergency Medicine

## 2017-07-17 ENCOUNTER — Ambulatory Visit (INDEPENDENT_AMBULATORY_CARE_PROVIDER_SITE_OTHER): Payer: Medicare Other | Admitting: Emergency Medicine

## 2017-07-17 DIAGNOSIS — J9611 Chronic respiratory failure with hypoxia: Secondary | ICD-10-CM | POA: Diagnosis not present

## 2017-07-17 DIAGNOSIS — J438 Other emphysema: Secondary | ICD-10-CM | POA: Diagnosis not present

## 2017-07-17 MED ORDER — PREDNISONE 10 MG PO TABS: 10.0000 mg | ORAL_TABLET | Freq: Every day | ORAL | 1 refills | Status: DC

## 2017-07-17 NOTE — Assessment & Plan Note (Signed)
Please continue your spiriva and symbicort as you are taking them  Flu shot up to date.  We will start prednisone 10mg  daily until your next visit.  Follow with Dr Delton CoombesByrum in 1 month to assess your status on the prednisone.

## 2017-07-17 NOTE — Assessment & Plan Note (Signed)
On evaluation today she does not appear to be triggering her pulse flow reliably.  Only when she takes a deeper inspiration.  I wonder whether she needs continuous flow.  I would like to look into a portable oxygen concentrator.  1 of her concerns is that if she gets this she does not want to lose her home concentrator.  We will perform a walking oximetry today to see what dose of pulse flow versus continuous flow she requires to saturate adequately.

## 2017-07-17 NOTE — Patient Instructions (Signed)
Please continue your spiriva and symbicort as you are taking them  We will look into getting you a continuous flow oxygen portable concentrator.  Walking oximetry today to insure that 3L/min pulsed or 2L/min continuous is adequate Flu shot up to date.  We will start prednisone 10mg  daily until your next visit.  Follow with Dr Delton CoombesByrum in 1 month to assess your status on the prednisone.

## 2017-07-17 NOTE — Progress Notes (Signed)
   Subjective:    Patient ID: Clayborne DanaBrenda Fetterly, female    DOB: 03/03/1941, 76 y.o.   MRN: [id number]  HPI 76 year-old woman, former smoker (40 pack years), with a history of hypertension. She has been followed by Dr. Shelle Ironlance for COPD. I personally reviewed her pulmonary function testing from 2012 that showed a severe obstruction with an FEV1 of 0.85 L (34% predicted). She has been managed on Spiriva, dulera and albuterol when necessary. She has oxygen that she uses 2L/min with exertion, is able to rest on RA. She sleeps with it.  Has tolerated her meds.  She feels that her breathing has been stable. She is able to shop, get out, lives alone.  No wheeze, minimal cough with clear phlegm. Up to date flu shot.  has had pneumovax, never prevnar.   ROV 07/17/17 --patient has severe COPD, chronic hypoxemic respiratory failure.  At her last visit I tried changing her to see also to see if she would benefit.  Ultimately she called and believed that she was doing better on Symbicort plus Spiriva.  Her current oxygen is at 3 L/min. She tells me that she is more dyspneic, SOB with any activity. She picked up on a worsening this summer. She is still able to exert some, but has to stop to rest. She is using albuterol once a day. She has occasional cough, wheeze.    Review of Systems As per HPI     Objective:   Physical Exam  Vitals:   07/17/17 1021  BP: 126/78  Pulse: 88  SpO2: 90%  Weight: 195 lb (88.5 kg)  Height: 5\' 6"  (1.676 m)   Gen: Pleasant, well-nourished, in no distress,  normal affect  ENT: No lesions,  mouth clear,  oropharynx clear, no postnasal drip  Neck: No JVD, hoarse voice, very mild inspiratory and expiratory stridor  Lungs: No use of accessory muscles, good air movement, no overt wheezing,  Cardiovascular: RRR, heart sounds normal, no murmur or gallops, no peripheral edema  Musculoskeletal: No deformities, no cyanosis or clubbing  Neuro: alert, non focal  Skin: Warm, no lesions  or rashes      Assessment & Plan:  COPD (chronic obstructive pulmonary disease) with emphysema Please continue your spiriva and symbicort as you are taking them  Flu shot up to date.  We will start prednisone 10mg  daily until your next visit.  Follow with Dr Delton CoombesByrum in 1 month to assess your status on the prednisone.   Hypoxemic respiratory failure, chronic (HCC) On evaluation today she does not appear to be triggering her pulse flow reliably.  Only when she takes a deeper inspiration.  I wonder whether she needs continuous flow.  I would like to look into a portable oxygen concentrator.  1 of her concerns is that if she gets this she does not want to lose her home concentrator.  We will perform a walking oximetry today to see what dose of pulse flow versus continuous flow she requires to saturate adequately.  Levy Pupaobert Raykwon Hobbs, MD, PhD 07/17/2017, 10:51 AM [facility] [phone] or if no answer [phone]

## 2017-08-22 ENCOUNTER — Ambulatory Visit: Payer: Medicare Other | Admitting: Emergency Medicine

## 2017-09-09 ENCOUNTER — Other Ambulatory Visit: Payer: Self-pay | Admitting: Emergency Medicine

## 2017-09-29 ENCOUNTER — Ambulatory Visit (INDEPENDENT_AMBULATORY_CARE_PROVIDER_SITE_OTHER): Payer: Medicare Other | Admitting: Emergency Medicine

## 2017-09-29 ENCOUNTER — Encounter: Payer: Self-pay | Admitting: Emergency Medicine

## 2017-09-29 DIAGNOSIS — J438 Other emphysema: Secondary | ICD-10-CM | POA: Diagnosis not present

## 2017-09-29 DIAGNOSIS — J9611 Chronic respiratory failure with hypoxia: Secondary | ICD-10-CM

## 2017-09-29 NOTE — Assessment & Plan Note (Signed)
She decided not to get a portable oxygen concentrator because she did not want to give up her home standard concentrator.  She is on a pulse flow tank.  Continue oxygen at 3-6 L/min.  Keep track of your oxygen saturations, goal saturation is greater than 90%.  Use the lowest flow rate possible to maintain saturations greater than 90%

## 2017-09-29 NOTE — Patient Instructions (Addendum)
We will decrease prednisone to 5mg  daily for 3 weeks. If you are tolerating this change then go ahead and stop altogether at that time. Let us know if your develop any changes in breathing, develop weakness or any bothersome symptoms when we make this change.  Please continue Spiriva and Symbicort as you have been taking them Use albuterol 2 puffs if needed for shortness of breath, cough, wheezing Continue oxygen at 3-6 L/min.  Keep track of your oxygen saturations, goal saturation is greater than 90%.  Use the lowest flow rate possible to maintain saturations greater than 90% Follow with Danielle Harmon in 2 months or sooner if you have any problems.

## 2017-09-29 NOTE — Assessment & Plan Note (Signed)
We will decrease prednisone to 5mg  daily for 3 weeks. If you are tolerating this change then go ahead and stop altogether at that time. Let us know if your develop any changes in breathing, develop weakness or any bothersome symptoms when we make this change.  Please continue Spiriva and Symbicort as you have been taking them Use albuterol 2 puffs if needed for shortness of breath, cough, wheezing Follow with Dr Delton CoombesByrum in 2 months or sooner if you have any problems.

## 2017-09-29 NOTE — Progress Notes (Signed)
   Subjective:    Patient ID: Danielle Harmon, female    DOB: 1941-04-11, 77 y.o.   MRN: [id number]  HPI  ROV 09/29/17 --this is a follow-up visit for 77 year old former smoker with severe obstruction and COPD.  She has hypoxemic respiratory failure currently on oxygen at 3 L/min, has been increasing on her own to 6L/min pulsed w exertion.  She has experienced some slow progressive exertional dyspnea over the last 6 months. She is on JerseySpirive and Symbicort, didn't like Stiolto. We started pred 10mg  daily last time.    Review of Systems As per HPI     Objective:   Physical Exam  Vitals:   09/29/17 1047  BP: 130/82  Pulse: 96  SpO2: 98%  Weight: 201 lb (91.2 kg)  Height: 5\' 6"  (1.676 m)   Gen: Pleasant, well-nourished, in no distress,  normal affect  ENT: No lesions,  mouth clear,  oropharynx clear, no postnasal drip  Neck: No JVD, no stridor  Lungs: No use of accessory muscles, good air movement, no wheezing  Cardiovascular: RRR, heart sounds normal, no murmur or gallops, no peripheral edema  Musculoskeletal: No deformities, no cyanosis or clubbing  Neuro: alert, non focal  Skin: Warm, no lesions or rashes      Assessment & Plan:  COPD (chronic obstructive pulmonary disease) with emphysema We will decrease prednisone to 5mg  daily for 3 weeks. If you are tolerating this change then go ahead and stop altogether at that time. Let us know if your develop any changes in breathing, develop weakness or any bothersome symptoms when we make this change.  Please continue Spiriva and Symbicort as you have been taking them Use albuterol 2 puffs if needed for shortness of breath, cough, wheezing Follow with Dr Delton CoombesByrum in 2 months or sooner if you have any problems.  Hypoxemic respiratory failure, chronic (HCC) She decided not to get a portable oxygen concentrator because she did not want to give up her home standard concentrator.  She is on a pulse flow tank.  Continue oxygen at 3-6  L/min.  Keep track of your oxygen saturations, goal saturation is greater than 90%.  Use the lowest flow rate possible to maintain saturations greater than 90%  Levy Pupaobert Eann Cleland, MD, PhD 09/29/2017, 11:16 AM [facility] [phone] or if no answer [phone]

## 2017-11-27 ENCOUNTER — Encounter: Payer: Self-pay | Admitting: Emergency Medicine

## 2017-11-27 ENCOUNTER — Ambulatory Visit (INDEPENDENT_AMBULATORY_CARE_PROVIDER_SITE_OTHER): Payer: Medicare Other | Admitting: Emergency Medicine

## 2017-11-27 DIAGNOSIS — J438 Other emphysema: Secondary | ICD-10-CM | POA: Diagnosis not present

## 2017-11-27 DIAGNOSIS — J9611 Chronic respiratory failure with hypoxia: Secondary | ICD-10-CM | POA: Diagnosis not present

## 2017-11-27 NOTE — Progress Notes (Signed)
   Subjective:    Patient ID: Danielle Harmon, female    DOB: October 23, 1940, 77 y.o.   MRN: [id number]  HPI  ROV 09/29/17 --this is a follow-up visit for 77 year old former smoker with severe obstruction and COPD.  She has hypoxemic respiratory failure currently on oxygen at 3 L/min, has been increasing on her own to 6L/min pulsed w exertion.  She has experienced some slow progressive exertional dyspnea over the last 6 months. She is on JerseySpirive and Symbicort, didn't like Stiolto. We started pred 10mg  daily last time.   ROV 11/27/17 --follow-up visit for history of severe COPD with associated chronic hypoxemic respiratory failure.  She spends period of time on prednisone 10 mg daily.  At her last visit we undertook effort to wean the prednisone to off.  She reports today that she is off the pred, has tolerated it. Feels that her breathing has remained stable. She is using 4-5L/min O2. No new problems reported. She has slowed down, has exertional SOB. She is using albuterol 1x a day. She is coughing some days, not all. She uses mucinex reliably every day. No abx since last time.     Review of Systems As per HPI     Objective:   Physical Exam  Vitals:   11/27/17 1100 11/27/17 1101  BP:  130/82  Pulse:  97  SpO2:  93%  Weight: 201 lb (91.2 kg)   Height: 5\' 6"  (1.676 m)    Gen: Pleasant, well-nourished, in no distress,  normal affect  ENT: No lesions,  mouth clear,  oropharynx clear, no postnasal drip  Neck: No JVD, no stridor  Lungs: No use of accessory muscles, good air movement, no wheezing  Cardiovascular: RRR, heart sounds normal, no murmur or gallops, no peripheral edema  Musculoskeletal: No deformities, no cyanosis or clubbing  Neuro: alert, non focal  Skin: Warm, no lesions or rashes      Assessment & Plan:  COPD (chronic obstructive pulmonary disease) with emphysema She was able to come off the prednisone without incident.  Breathing is stable although she has slowed down her  activity.  Continue Symbicort, Spiriva, albuterol as needed.  Please continue your inhaled medications as you have been using them. Continue Mucinex 600 mg once a day. Follow with Dr Delton CoombesByrum in 4 months or sooner if you have any problems.  Hypoxemic respiratory failure, chronic (HCC) Please continue oxygen at 4-5 L/min based on your level of activity.  Levy Pupaobert Lina Hitch, MD, PhD 11/27/2017, 11:31 AM [facility] [phone] or if no answer [phone]

## 2017-11-27 NOTE — Assessment & Plan Note (Signed)
Please continue oxygen at 4-5 L/min based on your level of activity.

## 2017-11-27 NOTE — Patient Instructions (Signed)
Please continue your inhaled medications as you have been using them. Please continue oxygen at 4-5 L/min based on your level of activity. Continue Mucinex 600 mg once a day. Follow with Dr Delton CoombesByrum in 4 months or sooner if you have any problems.

## 2017-11-27 NOTE — Assessment & Plan Note (Signed)
She was able to come off the prednisone without incident.  Breathing is stable although she has slowed down her activity.  Continue Symbicort, Spiriva, albuterol as needed.  Please continue your inhaled medications as you have been using them. Continue Mucinex 600 mg once a day. Follow with Danielle Harmon in 4 months or sooner if you have any problems.

## 2018-04-16 ENCOUNTER — Ambulatory Visit (INDEPENDENT_AMBULATORY_CARE_PROVIDER_SITE_OTHER): Payer: Medicare Other | Admitting: Emergency Medicine

## 2018-04-16 ENCOUNTER — Encounter: Payer: Self-pay | Admitting: Emergency Medicine

## 2018-04-16 DIAGNOSIS — J9611 Chronic respiratory failure with hypoxia: Secondary | ICD-10-CM

## 2018-04-16 DIAGNOSIS — J438 Other emphysema: Secondary | ICD-10-CM | POA: Diagnosis not present

## 2018-04-16 MED ORDER — SPACER/AERO CHAMBER MOUTHPIECE MISC: 0 refills | Status: DC

## 2018-04-16 NOTE — Assessment & Plan Note (Signed)
Please continue your Spiriva and Symbicort as you are taking them  Keep your albuterol available to use 2 puffs if needed for shortness of breath. We will obtain a spacer for you.  We will not restart daily prednisone at this time, but we may decide to revisit this at some point in the future depending on how your breathing is doing. Continue mucinex as you are taking it We talked some about pulmonary rehab today.  You might benefit from this, might increase her functional capacity.  We can check to see if this is available near your home at some point if you are willing to consider. Follow with Dr Delton CoombesByrum in 4 months or sooner if you have any problems.

## 2018-04-16 NOTE — Addendum Note (Signed)
Addended by: Zane HeraldMOORE, Hazim Treadway R on: 04/16/2018 11:12 AM   Modules accepted: Orders

## 2018-04-16 NOTE — Progress Notes (Signed)
Subjective:    Patient ID: Danielle Harmon, female    DOB: 1941/02/17, 77 y.o.   MRN: [id number]  HPI  ROV 09/29/17 --this is a follow-up visit for 77 year old former smoker with severe obstruction and COPD.  She has hypoxemic respiratory failure currently on oxygen at 3 L/min, has been increasing on her own to 6L/min pulsed w exertion.  She has experienced some slow progressive exertional dyspnea over the last 6 months. She is on Jersey and Symbicort, didn't like Stiolto. We started pred 10mg  daily last time.   ROV 11/27/17 --follow-up visit for history of severe COPD with associated chronic hypoxemic respiratory failure.  She spends period of time on prednisone 10 mg daily.  At her last visit we undertook effort to wean the prednisone to off.  She reports today that she is off the pred, has tolerated it. Feels that her breathing has remained stable. She is using 4-5L/min O2. No new problems reported. She has slowed down, has exertional SOB. She is using albuterol 1x a day. She is coughing some days, not all. She uses mucinex reliably every day. No abx since last time.   ROV 04/16/18 --patient has a history of chronic hypoxemic respiratory failure in the setting of severe COPD.  She is been on chronic prednisone in the past but currently this has been weaned to off.  She is managed on Symbicort, Spiriva.  She uses albuterol approximately.  She uses oxygen at 4 to 5 L/min depending on her activity level. She reports that her breathing has not changed much but that her activity is quite limited. She is coughing every day, productive of thick white. On mucinex. No flares since last time, no pred. Albuterol helpful, uses about every other day.     Review of Systems As per HPI     Objective:   Physical Exam  Vitals:   04/16/18 1023  BP: (!) 148/86  Pulse: 96  SpO2: 90%  Weight: 199 lb 9.6 oz (90.5 kg)  Height: 5\' 6"  (1.676 m)   Gen: Pleasant, well-nourished, in no distress,  normal affect  ENT:  No lesions,  mouth clear,  oropharynx clear, no postnasal drip  Neck: No JVD, no stridor  Lungs: No use of accessory muscles, good air movement, no wheezing or crackles  Cardiovascular: RRR, heart sounds normal, no murmur or gallops, 2+ LE peripheral edema  Musculoskeletal: No deformities, no cyanosis or clubbing  Neuro: alert, non focal  Skin: Warm, no lesions or rashes      Assessment & Plan:  COPD (chronic obstructive pulmonary disease) with emphysema Please continue your Spiriva and Symbicort as you are taking them  Keep your albuterol available to use 2 puffs if needed for shortness of breath. We will obtain a spacer for you.  We will not restart daily prednisone at this time, but we may decide to revisit this at some point in the future depending on how your breathing is doing. Continue mucinex as you are taking it We talked some about pulmonary rehab today.  You might benefit from this, might increase her functional capacity.  We can check to see if this is available near your home at some point if you are willing to consider. Follow with Dr Delton Coombes in 4 months or sooner if you have any problems.  Hypoxemic respiratory failure, chronic (HCC) Continue to use your oxygen at all times.  Use 4 to 6 L/min depending on the level of your exertion.  We want your saturations  to remain 88 to 92%  Levy Pupaobert Myriam Brandhorst, MD, PhD 04/16/2018, 11:04 AM [facility] [phone] or if no answer [phone]

## 2018-04-16 NOTE — Assessment & Plan Note (Signed)
Continue to use your oxygen at all times.  Use 4 to 6 L/min depending on the level of your exertion.  We want your saturations to remain 88 to 92%

## 2018-04-16 NOTE — Patient Instructions (Addendum)
Please continue your Spiriva and Symbicort as you are taking them  Keep your albuterol available to use 2 puffs if needed for shortness of breath. We will obtain a spacer for you.  We will not restart daily prednisone at this time, but we may decide to revisit this at some point in the future depending on how your breathing is doing. Continue mucinex as you are taking it Continue to use your oxygen at all times.  Use 4 to 6 L/min depending on the level of your exertion.  We want your saturations to remain 88 to 92% We talked some about pulmonary rehab today.  You might benefit from this, might increase her functional capacity.  We can check to see if this is available near your home at some point if you are willing to consider. Follow with Dr Delton CoombesByrum in 4 months or sooner if you have any problems.

## 2018-04-24 ENCOUNTER — Telehealth: Payer: Self-pay | Admitting: Emergency Medicine

## 2018-04-24 NOTE — Telephone Encounter (Signed)
She needs to have an appointment with Dr. Delton CoombesByrum to discuss in full detail the pro's and con's of chronic prednisone therapy.

## 2018-04-24 NOTE — Telephone Encounter (Signed)
Called and spoke with patient. Advised her of VS response. Patient states that her son is the one who brings her to and from appointments. She is going to contact her son in regards to getting the appointment set up for when he can bring her and she will call us back. Nothing further needed from us. Will sign off.

## 2018-04-24 NOTE — Telephone Encounter (Signed)
Called spoke with patient who reports saturation levels dropping to the mid-80s Does note some increased SOB Has increased O2 to 8L (from the 4-6L recommended at last ov) Some cough yesterday with clear mucus, but none today Denies any f/c/s, wheezing, tightness, hemoptysis, chest pain, head congestion, PND or leg swelling  Offered appt but pt declined due to lack of transportation Patient stated that RB mentioned prednisone last ov and would like to know if this would be appropriate  RB not available today, will route to DOD Dr Craige CottaSood Please advise, thank you  [facility] Allerg[facility]  Allergen Reactions  . Bee Venom Anaphylaxis     Per 7.25.19 office visit with RB: Patient Instructions  Please continue your Spiriva and Symbicort as you are taking them  Keep your albuterol available to use 2 puffs if needed for shortness of breath. We will obtain a spacer for you.  We will not restart daily prednisone at this time, but we may decide to revisit this at some point in the future depending on how your breathing is doing. Continue mucinex as you are taking it Continue to use your oxygen at all times.  Use 4 to 6 L/min depending on the level of your exertion.  We want your saturations to remain 88 to 92% We talked some about pulmonary rehab today.  You might benefit from this, might increase her functional capacity.  We can check to see if this is available near your home at some point if you are willing to consider. Follow with Dr Delton CoombesByrum in 4 months or sooner if you have any problems.

## 2018-09-11 ENCOUNTER — Ambulatory Visit: Payer: Medicare Other | Admitting: Emergency Medicine

## 2018-10-01 ENCOUNTER — Ambulatory Visit (INDEPENDENT_AMBULATORY_CARE_PROVIDER_SITE_OTHER): Payer: Medicare Other | Admitting: Emergency Medicine

## 2018-10-01 ENCOUNTER — Encounter: Payer: Self-pay | Admitting: Emergency Medicine

## 2018-10-01 DIAGNOSIS — J438 Other emphysema: Secondary | ICD-10-CM | POA: Diagnosis not present

## 2018-10-01 DIAGNOSIS — J9611 Chronic respiratory failure with hypoxia: Secondary | ICD-10-CM | POA: Diagnosis not present

## 2018-10-01 MED ORDER — FLUTICASONE-UMECLIDIN-VILANT 100-62.5-25 MCG/INH IN AEPB: 1.0000 | INHALATION_SPRAY | Freq: Every day | RESPIRATORY_TRACT | 0 refills | Status: DC

## 2018-10-01 NOTE — Patient Instructions (Signed)
Temporarily stop Spiriva and Symbicort. We will try starting Trelegy 1 inhalation daily.  Remember to rinse and gargle after you use it.  Please call our office to let us know if you benefit.  If so we will continue, send a prescription to your pharmacy Keep your albuterol available to use 2 puffs if needed for shortness of breath, chest tightness, wheezing. Depending on how you respond to the Trelegy we may revisit starting low-dose prednisone in the future. Continue your oxygen at 4 to 6 L/min depending on your level of exertion. Flu shot up-to-date Follow with Dr Delton Coombes in 3 months or sooner if you have any problems.

## 2018-10-01 NOTE — Assessment & Plan Note (Signed)
Temporarily stop Spiriva and Symbicort. We will try starting Trelegy 1 inhalation daily.  Remember to rinse and gargle after you use it.  Please call our office to let us know if you benefit.  If so we will continue, send a prescription to your pharmacy Keep your albuterol available to use 2 puffs if needed for shortness of breath, chest tightness, wheezing. Depending on how you respond to the Trelegy we may revisit starting low-dose prednisone in the future. Flu shot up-to-date Follow with Dr Delton Coombes in 3 months or sooner if you have any problems.

## 2018-10-01 NOTE — Assessment & Plan Note (Signed)
Continue your oxygen at 4 to 6 L/min depending on your level of exertion.

## 2018-10-01 NOTE — Addendum Note (Signed)
Addended by: Jaynee Eagles C on: 10/01/2018 11:34 AM   Modules accepted: Orders

## 2018-10-01 NOTE — Progress Notes (Signed)
Subjective:    Patient ID: Danielle Harmon, female    DOB: 01-Jun-1941, 78 y.o.   MRN: [id number]  HPI  ROV 09/29/17 --this is a follow-up visit for 78 year old former smoker with severe obstruction and COPD.  She has hypoxemic respiratory failure currently on oxygen at 3 L/min, has been increasing on her own to 6L/min pulsed w exertion.  She has experienced some slow progressive exertional dyspnea over the last 6 months. She is on Jersey and Symbicort, didn't like Stiolto. We started pred 10mg  daily last time.   ROV 11/27/17 --follow-up visit for history of severe COPD with associated chronic hypoxemic respiratory failure.  She spends period of time on prednisone 10 mg daily.  At her last visit we undertook effort to wean the prednisone to off.  She reports today that she is off the pred, has tolerated it. Feels that her breathing has remained stable. She is using 4-5L/min O2. No new problems reported. She has slowed down, has exertional SOB. She is using albuterol 1x a day. She is coughing some days, not all. She uses mucinex reliably every day. No abx since last time.   ROV 04/16/18 --patient has a history of chronic hypoxemic respiratory failure in the setting of severe COPD.  She is been on chronic prednisone in the past but currently this has been weaned to off.  She is managed on Symbicort, Spiriva.  She uses albuterol approximately.  She uses oxygen at 4 to 5 L/min depending on her activity level. She reports that her breathing has not changed much but that her activity is quite limited. She is coughing every day, productive of thick white. On mucinex. No flares since last time, no pred. Albuterol helpful, uses about every other day.   ROV 10/01/18 --follow-up visit for severe COPD with associated chronic hypoxemic respiratory failure.  We have been managing her with Spiriva and Symbicort.  She is been on scheduled prednisone in the past but not currently.  Her oxygen needs are 4 to 6 L/min depending on  her level of exertion. Her exertion is limited. Still drives herself. She has trouble with stairs. She uses albuterol about 1x a day. Some daily cough, wheeze.     Review of Systems As per HPI     Objective:   Physical Exam  Vitals:   10/01/18 1110  BP: 120/84  Pulse: 87  SpO2: 97%  Weight: 202 lb (91.6 kg)  Height: 5\' 6"  (1.676 m)   Gen: Pleasant, well-nourished, in no distress,  normal affect  ENT: No lesions,  mouth clear,  oropharynx clear, no postnasal drip  Neck: No JVD, no stridor  Lungs: No use of accessory muscles, good air movement, no wheezing or crackles  Cardiovascular: RRR, heart sounds normal, no murmur or gallops, 1+ LE peripheral edema  Musculoskeletal: No deformities, no cyanosis or clubbing  Neuro: alert, non focal  Skin: Warm, no lesions or rashes      Assessment & Plan:  COPD (chronic obstructive pulmonary disease) with emphysema Temporarily stop Spiriva and Symbicort. We will try starting Trelegy 1 inhalation daily.  Remember to rinse and gargle after you use it.  Please call our office to let us know if you benefit.  If so we will continue, send a prescription to your pharmacy Keep your albuterol available to use 2 puffs if needed for shortness of breath, chest tightness, wheezing. Depending on how you respond to the Trelegy we may revisit starting low-dose prednisone in the future. Flu shot  up-to-date Follow with Dr Delton CoombesByrum in 3 months or sooner if you have any problems.  Hypoxemic respiratory failure, chronic (HCC) Continue your oxygen at 4 to 6 L/min depending on your level of exertion.  Levy Pupaobert Alizey Noren, MD, PhD 10/01/2018, 11:32 AM [facility] [phone] or if no answer [phone]

## 2018-10-14 ENCOUNTER — Telehealth: Payer: Self-pay | Admitting: Emergency Medicine

## 2018-10-14 ENCOUNTER — Encounter: Payer: Self-pay | Admitting: Emergency Medicine

## 2018-10-14 NOTE — Telephone Encounter (Signed)
Call made to patient, she states she was given trelegy at her last visit and was told to call in and let us know how it was going. She states she is using it daily and she cannot tell a big difference. She states some days it seems to help and others it does seem like it helps at all. She wanted to know if she should stay on it until her next office visit. If so she will need it sent to her pharmacy.   RB please advise if you would like patient to continue on the trelegy inhaler. Thanks.

## 2018-10-15 MED ORDER — FLUTICASONE-UMECLIDIN-VILANT 100-62.5-25 MCG/INH IN AEPB: 1.0000 | INHALATION_SPRAY | Freq: Every day | RESPIRATORY_TRACT | 5 refills | Status: DC

## 2018-10-15 MED ORDER — FLUTICASONE-UMECLIDIN-VILANT 100-62.5-25 MCG/INH IN AEPB: 1.0000 | INHALATION_SPRAY | Freq: Every day | RESPIRATORY_TRACT | 1 refills | Status: DC

## 2018-10-15 NOTE — Telephone Encounter (Signed)
I see that last note said if she was benefiting to continue so I have sent this to [facility] with the pt and notified this was done  Nothing further needed

## 2018-10-15 NOTE — Telephone Encounter (Signed)
Pt want's a prescription of Trelegy [facility] [place]                  pt number [phone]

## 2018-10-27 NOTE — Progress Notes (Unsigned)
Danielle Harmon please close this encounter as there is no documentation. Thank you.

## 2019-01-07 ENCOUNTER — Ambulatory Visit: Payer: Medicare Other | Admitting: Emergency Medicine

## 2019-01-19 ENCOUNTER — Other Ambulatory Visit: Payer: Self-pay | Admitting: *Deleted

## 2019-01-19 MED ORDER — FLUTICASONE-UMECLIDIN-VILANT 100-62.5-25 MCG/INH IN AEPB: 1.0000 | INHALATION_SPRAY | Freq: Every day | RESPIRATORY_TRACT | 1 refills | Status: DC

## 2019-01-20 ENCOUNTER — Telehealth: Payer: Self-pay | Admitting: Emergency Medicine

## 2019-01-20 NOTE — Telephone Encounter (Signed)
LVM for patient to return call regarding prescription for 90 days. X1

## 2019-01-20 NOTE — Telephone Encounter (Signed)
Patient is returning phone call.  [facility]  Patient phone number is [phone].

## 2019-01-20 NOTE — Telephone Encounter (Signed)
Called pt to find out what was needed. It appears she should have refills on file. She says pharmacy told her she didn't.  [facility] spoke to Weston Brass pt has refills on file. Her insurance is saying its to early to fill last fill 12/21/18. Weston Brass states they [facility] will call patient's insurance and find out why insurance is not covering.   Called pt back and made her aware to [facility] at noon and find out what insurance says. She is to then call office back and let us know if we need to send another 90 day. Issue may be that pt has to start using mail order now instead of local pharmacy.She has used local x 3 refills.  Will leave encounter open until patient calls back.

## 2019-01-21 NOTE — Telephone Encounter (Signed)
[facility] Nothing further needed.

## 2019-04-06 ENCOUNTER — Ambulatory Visit: Payer: Medicare Other | Admitting: Emergency Medicine

## 2019-04-16 ENCOUNTER — Ambulatory Visit: Payer: Medicare Other | Admitting: Emergency Medicine

## 2019-04-26 ENCOUNTER — Ambulatory Visit (INDEPENDENT_AMBULATORY_CARE_PROVIDER_SITE_OTHER): Payer: Medicare Other | Admitting: Emergency Medicine

## 2019-04-26 ENCOUNTER — Other Ambulatory Visit: Payer: Self-pay

## 2019-04-26 ENCOUNTER — Encounter: Payer: Self-pay | Admitting: Emergency Medicine

## 2019-04-26 DIAGNOSIS — I10 Essential (primary) hypertension: Secondary | ICD-10-CM | POA: Diagnosis not present

## 2019-04-26 DIAGNOSIS — J9611 Chronic respiratory failure with hypoxia: Secondary | ICD-10-CM

## 2019-04-26 DIAGNOSIS — J438 Other emphysema: Secondary | ICD-10-CM | POA: Diagnosis not present

## 2019-04-26 MED ORDER — HYDROCHLOROTHIAZIDE 25 MG PO TABS: 25.0000 mg | ORAL_TABLET | Freq: Every day | ORAL | 0 refills | Status: DC

## 2019-04-26 NOTE — Assessment & Plan Note (Signed)
Continue your oxygen at 4 to 6 L/min as you have been using it.

## 2019-04-26 NOTE — Patient Instructions (Signed)
We will refill your hydrochlorothiazide today until you can get in with your new primary care physician. Please continue Trelegy 1 inhalation once daily.  Remember to rinse and gargle after using. Keep your albuterol available to use 2 puffs to be needed for shortness of breath, chest tightness, wheezing. Continue your oxygen at 4 to 6 L/min as you have been using it. You will need the flu shot in the fall. Follow with Dr Lamonte Sakai in 6 months or sooner if you have any problems

## 2019-04-26 NOTE — Assessment & Plan Note (Signed)
Overall stable without any exacerbations.  She had not lost any ground on Trelegy although she is not sure that it is any better than her previous regimen.  We agreed to continue it   Please continue Trelegy 1 inhalation once daily.  Remember to rinse and gargle after using. Keep your albuterol available to use 2 puffs to be needed for shortness of breath, chest tightness, wheezing. You will need the flu shot in the fall. Follow with Dr Lamonte Sakai in 6 months or sooner if you have any problems

## 2019-04-26 NOTE — Progress Notes (Signed)
Subjective:    Patient ID: Danielle Harmon, female    DOB: November 29, 1940, 78 y.o.   MRN: [id number]  HPI  ROV 04/16/18 --patient has a history of chronic hypoxemic respiratory failure in the setting of severe COPD.  She is been on chronic prednisone in the past but currently this has been weaned to off.  She is managed on Symbicort, Spiriva.  She uses albuterol approximately.  She uses oxygen at 4 to 5 L/min depending on her activity level. She reports that her breathing has not changed much but that her activity is quite limited. She is coughing every day, productive of thick white. On mucinex. No flares since last time, no pred. Albuterol helpful, uses about every other day.   ROV 10/01/18 --follow-up visit for severe COPD with associated chronic hypoxemic respiratory failure.  We have been managing her with Spiriva and Symbicort.  She is been on scheduled prednisone in the past but not currently.  Her oxygen needs are 4 to 6 L/min depending on her level of exertion. Her exertion is limited. Still drives herself. She has trouble with stairs. She uses albuterol about 1x a day. Some daily cough, wheeze.   ROV 04/26/2019 --Danielle Harmon is [age], has a history of severe COPD (FEV1 0.85 L, 34% predicted, June 2012) with associated chronic hypoxemic respiratory failure on 4 to 6 L/min.  She was last seen in January when I tried changing Spiriva/Symbicort to Trelegy to see if she would get more benefit.  She reports that she is doing about the same. She sometimes wonders if she is getting it into her chest well.   She uses albuterol every morning and then about once more per day. She was treated with abx for R hand cellulitis in early July. No flares.  She needs a refill of her HCTZ until she can get an appointment with her new primary care physician.    Review of Systems As per HPI     Objective:   Physical Exam  Vitals:   04/26/19 1035  BP: (!) 142/86  Pulse: 90  SpO2: 96%  Weight: 209 lb (94.8 kg)  Height:  5\' 6"  (1.676 m)   Gen: Pleasant, well-nourished, in no distress,  normal affect, O2 in place  ENT: No lesions,  mouth clear,  oropharynx clear, no postnasal drip  Neck: No JVD, no stridor  Lungs: No use of accessory muscles, good air movement, no wheezing or crackles  Cardiovascular: RRR, heart sounds normal, no murmur or gallops, 1+ LE peripheral edema  Musculoskeletal: No deformities, no cyanosis or clubbing  Neuro: alert, non focal  Skin: Warm, no lesions or rashes      Assessment & Plan:  Hypertension She is working on getting a new primary care physician.  She needs a refill of her HCTZ until she can get an appointment.  She is been on a stable dose for many years and I will refill for her, no change.  COPD (chronic obstructive pulmonary disease) with emphysema Overall stable without any exacerbations.  She had not lost any ground on Trelegy although she is not sure that it is any better than her previous regimen.  We agreed to continue it   Please continue Trelegy 1 inhalation once daily.  Remember to rinse and gargle after using. Keep your albuterol available to use 2 puffs to be needed for shortness of breath, chest tightness, wheezing. You will need the flu shot in the fall. Follow with Dr Delton CoombesByrum in 6 months  or sooner if you have any problems  Hypoxemic respiratory failure, chronic ([place]) Continue your oxygen at 4 to 6 L/min as you have been using it.  Baltazar Apo, MD, PhD 04/26/2019, 10:51 AM [facility] [phone] or if no answer [phone]

## 2019-04-26 NOTE — Assessment & Plan Note (Signed)
She is working on getting a new primary care physician.  She needs a refill of her HCTZ until she can get an appointment.  She is been on a stable dose for many years and I will refill for her, no change.

## 2019-05-04 ENCOUNTER — Other Ambulatory Visit: Payer: Self-pay

## 2019-05-05 ENCOUNTER — Encounter: Payer: Self-pay | Admitting: Physician Assistant

## 2019-05-05 ENCOUNTER — Ambulatory Visit (INDEPENDENT_AMBULATORY_CARE_PROVIDER_SITE_OTHER): Payer: Medicare Other | Admitting: Physician Assistant

## 2019-05-05 VITALS — BP 122/76 | HR 91 | Temp 98.2°F | Ht 66.0 in | Wt 203.0 lb

## 2019-05-05 DIAGNOSIS — J9611 Chronic respiratory failure with hypoxia: Secondary | ICD-10-CM | POA: Diagnosis not present

## 2019-05-05 DIAGNOSIS — K219 Gastro-esophageal reflux disease without esophagitis: Secondary | ICD-10-CM

## 2019-05-05 DIAGNOSIS — I10 Essential (primary) hypertension: Secondary | ICD-10-CM

## 2019-05-05 DIAGNOSIS — J438 Other emphysema: Secondary | ICD-10-CM | POA: Diagnosis not present

## 2019-05-05 DIAGNOSIS — Z0000 Encounter for general adult medical examination without abnormal findings: Secondary | ICD-10-CM

## 2019-05-05 MED ORDER — POTASSIUM CHLORIDE ER 10 MEQ PO TBCR: 10.0000 meq | EXTENDED_RELEASE_TABLET | Freq: Two times a day (BID) | ORAL | 3 refills | Status: DC

## 2019-05-05 MED ORDER — HYDROCHLOROTHIAZIDE 25 MG PO TABS: 25.0000 mg | ORAL_TABLET | Freq: Every day | ORAL | 3 refills | Status: DC

## 2019-05-05 MED ORDER — OMEPRAZOLE 20 MG PO CPDR: 20.0000 mg | DELAYED_RELEASE_CAPSULE | Freq: Every day | ORAL | 3 refills | Status: DC

## 2019-05-05 MED ORDER — AMLODIPINE BESYLATE 5 MG PO TABS: 5.0000 mg | ORAL_TABLET | Freq: Every day | ORAL | 3 refills | Status: DC

## 2019-05-05 NOTE — Progress Notes (Signed)
BP 122/76   Pulse 91   Temp 98.2 F (36.8 C) (Temporal)   Ht 5' 6"  (1.676 m)   Wt 203 lb (92.1 kg)   SpO2 97% Comment: 5 liters  BMI 32.77 kg/m    Subjective:    Patient ID: Danielle Harmon, female    DOB: 02-May-1941, 78 y.o.   MRN: [id number]  HPI: Danielle Harmon is a 78 y.o. female presenting on 05/05/2019 for New Patient (Initial Visit) and Establish Care  Concerning to be established with [place].  She has been a longtime patient of Dr. she does have COPD and is under the care of Dr. Lamonte Sakai.  He did have a visit last week.  She states that overall she has been doing very well not having any exacerbations.  She is quite stable on her medications.  And he does refill respiratory medications.  We did discuss the neb solution that was on her list.  She states that she has not had these in some time.  And encouraged her to probably get rid of that medication if it is old because it probably is not as effective that she try to use it.  If she does have a need for it to give [place] a call we will send a prescription in.  The patient also has hypertension and has been having some edema in her lower legs.  Following time she had been on hydrochlorothiazide 25 mg 1 daily.  She also had to have potassium to be taken with it.  We reviewed her labs.  She had a changed to Lasix 44m and it did not work as effectively.  And then because of the office closing she was not able to get the medication.  Dr. BLamonte Sakaisent in hydrochlorothiazide last week.  And she feels like it has worked better.  So we will go back to the hydrochlorothiazide at this time.  All of her medications and diagnoses are reviewed.  She will have labs performed today.  We are checking on shingles vaccine for her.  Her other vaccines are up-to-date.  Past Medical History:  Diagnosis Date  . [place] obstructive [place] disease) [place]   . Hypertension    Relevant past medical, surgical, family and social history reviewed and updated as  indicated. Interim medical history since our last visit reviewed. Allergies and medications reviewed and updated. DATA REVIEWED: CHART IN EPIC  Family History reviewed for pertinent findings.  Review of Systems  Constitutional: Positive for fatigue.  HENT: Negative.   Eyes: Negative.   Respiratory: Positive for shortness of breath. Negative for wheezing.   Cardiovascular: Positive for leg swelling. Negative for chest pain and palpitations.  Gastrointestinal: Negative.   Genitourinary: Negative.   Musculoskeletal: Negative.     Allergies as of 05/05/2019      Reactions   Bee Venom Anaphylaxis      Medication List       Accurate as of May 05, 2019  9:54 AM. If you have any questions, ask your nurse or doctor.        STOP taking these medications   furosemide 20 MG tablet Commonly known as: LASIX Stopped by: ATerald Sleeper PA-C     TAKE these medications   amLODipine 5 MG tablet Commonly known as: NORVASC Take 1 tablet (5 mg total) by mouth daily.   Fluticasone-Umeclidin-Vilant 100-62.5-25 MCG/INH Aepb Commonly known as: Trelegy Ellipta Inhale 1 puff into the lungs daily.   guaiFENesin 600 MG 12 hr tablet  Commonly known as: MUCINEX Take 600 mg by mouth as needed.   hydrochlorothiazide 25 MG tablet Commonly known as: HYDRODIURIL Take 1 tablet (25 mg total) by mouth daily.   omeprazole 20 MG capsule Commonly known as: PRILOSEC Take 1 capsule (20 mg total) by mouth daily.   potassium chloride 10 MEQ tablet Commonly known as: K-DUR Take 1 tablet (10 mEq total) by mouth 2 (two) times daily.   ProAir HFA 108 (90 Base) MCG/ACT inhaler Generic drug: albuterol Inhale 2 puffs into the lungs every 6 (six) hours as needed.   albuterol (2.5 MG/3ML) 0.083% nebulizer solution Commonly known as: PROVENTIL Take 3 mLs (2.5 mg total) by nebulization every 6 (six) hours as needed for wheezing or shortness of breath. DX J43.8   Spacer/Aero Chamber [employer] Use as  directed          Objective:    BP 122/76   Pulse 91   Temp 98.2 F (36.8 C) (Temporal)   Ht 5' 6"  (1.676 m)   Wt 203 lb (92.1 kg)   SpO2 97% Comment: 5 liters  BMI 32.77 kg/m   Allergies  Allergen Reactions  . Bee Venom Anaphylaxis    Wt Readings from Last 3 Encounters:  05/05/19 203 lb (92.1 kg)  04/26/19 209 lb (94.8 kg)  10/01/18 202 lb (91.6 kg)    Physical Exam Constitutional:      General: She is not in acute distress.    Appearance: Normal appearance. She is well-developed.  HENT:     Head: Normocephalic and atraumatic.  Cardiovascular:     Rate and Rhythm: Normal rate.  Pulmonary:     Effort: Pulmonary effort is normal.  Skin:    General: Skin is warm and dry.     Findings: No rash.  Neurological:     Mental Status: She is alert and oriented to person, place, and time.     Deep Tendon Reflexes: Reflexes are normal and symmetric.     Results for orders placed or performed in visit on 03/21/11  Pulmonary function test  Result Value Ref Range   FEV1  liters   FVC  liters   FEV1/FVC  %   TLC  liters   DLCO  ml/mmHg sec      Assessment & Plan:   1. Hypoxemic respiratory failure, chronic (HCC) - CBC with Differential/Platelet - CMP14+EGFR - Lipid panel - TSH  2. Essential hypertension - hydrochlorothiazide (HYDRODIURIL) 25 MG tablet; Take 1 tablet (25 mg total) by mouth daily.  Dispense: 90 tablet; Refill: 3 - potassium chloride (K-DUR) 10 MEQ tablet; Take 1 tablet (10 mEq total) by mouth 2 (two) times daily.  Dispense: 180 tablet; Refill: 3 - amLODipine (NORVASC) 5 MG tablet; Take 1 tablet (5 mg total) by mouth daily.  Dispense: 90 tablet; Refill: 3 - CBC with Differential/Platelet - CMP14+EGFR - Lipid panel - TSH  3. Gastro-esophageal reflux disease without esophagitis Continue omeprazole 20 mg once daily  4. Well adult exam Not performed, diagnosis for lab order - CBC with Differential/Platelet - CMP14+EGFR - Lipid panel - TSH   5. Other emphysema ([place])    Continue all other maintenance medications as listed above.  Follow up plan: Return in about 6 months (around 11/05/2019).  Educational handout given for [place] PA-C [facility] [address]  [place], [place] [address] [phone]   05/05/2019, 9:54 AM

## 2019-05-06 LAB — CMP14+EGFR
ALT: 14 IU/L (ref 0–32)
AST: 21 IU/L (ref 0–40)
Albumin/Globulin Ratio: 1.7 (ref 1.2–2.2)
Albumin: 4.2 g/dL (ref 3.7–4.7)
Alkaline Phosphatase: 92 IU/L (ref 39–117)
BUN/Creatinine Ratio: 15 (ref 12–28)
BUN: 12 mg/dL (ref 8–27)
Bilirubin Total: 0.4 mg/dL (ref 0.0–1.2)
CO2: 29 mmol/L (ref 20–29)
Calcium: 9.5 mg/dL (ref 8.7–10.3)
Chloride: 93 mmol/L — ABNORMAL LOW (ref 96–106)
Creatinine, Ser: 0.79 mg/dL (ref 0.57–1.00)
GFR calc Af Amer: 83 mL/min/{1.73_m2} (ref >59)
GFR calc non Af Amer: 72 mL/min/{1.73_m2} (ref >59)
Globulin, Total: 2.5 g/dL (ref 1.5–4.5)
Glucose: 92 mg/dL (ref 65–99)
Potassium: 4 mmol/L (ref 3.5–5.2)
Sodium: 137 mmol/L (ref 134–144)
Total Protein: 6.7 g/dL (ref 6.0–8.5)

## 2019-05-06 LAB — CBC WITH DIFFERENTIAL/PLATELET
Basophils Absolute: 0.1 10*3/uL (ref 0.0–0.2)
Basos: 1 %
EOS (ABSOLUTE): 0.1 10*3/uL (ref 0.0–0.4)
Eos: 1 %
Hematocrit: 43.3 % (ref 34.0–46.6)
Hemoglobin: 14.3 g/dL (ref 11.1–15.9)
Immature Grans (Abs): 0.1 10*3/uL (ref 0.0–0.1)
Immature Granulocytes: 1 %
Lymphocytes Absolute: 1.5 10*3/uL (ref 0.7–3.1)
Lymphs: 21 %
MCH: 29.9 pg (ref 26.6–33.0)
MCHC: 33 g/dL (ref 31.5–35.7)
MCV: 90 fL (ref 79–97)
Monocytes Absolute: 0.7 10*3/uL (ref 0.1–0.9)
Monocytes: 9 %
Neutrophils Absolute: 5.1 10*3/uL (ref 1.4–7.0)
Neutrophils: 67 %
Platelets: 267 10*3/uL (ref 150–450)
RBC: 4.79 x10E6/uL (ref 3.77–5.28)
RDW: 12.2 % (ref 11.7–15.4)
WBC: 7.5 10*3/uL (ref 3.4–10.8)

## 2019-05-06 LAB — TSH: TSH: 2.67 u[IU]/mL (ref 0.450–4.500)

## 2019-05-06 LAB — LIPID PANEL
Chol/HDL Ratio: 2.1 ratio (ref 0.0–4.4)
Cholesterol, Total: 201 mg/dL — ABNORMAL HIGH (ref 100–199)
HDL: 96 mg/dL (ref >39)
LDL Calculated: 93 mg/dL (ref 0–99)
Triglycerides: 60 mg/dL (ref 0–149)
VLDL Cholesterol Cal: 12 mg/dL (ref 5–40)

## 2019-05-19 ENCOUNTER — Ambulatory Visit (INDEPENDENT_AMBULATORY_CARE_PROVIDER_SITE_OTHER): Payer: Medicare Other | Admitting: *Deleted

## 2019-05-19 DIAGNOSIS — Z0000 Encounter for general adult medical examination without abnormal findings: Secondary | ICD-10-CM | POA: Diagnosis not present

## 2019-05-19 NOTE — Progress Notes (Signed)
MEDICARE ANNUAL WELLNESS VISIT  05/19/2019  Telephone Visit Disclaimer This Medicare AWV was conducted by telephone due to national recommendations for restrictions regarding the COVID-19 Pandemic (e.g. social distancing).  I verified, using two identifiers, that I am speaking with [name] or their authorized healthcare agent. I discussed the limitations, risks, security, and privacy concerns of performing an evaluation and management service by telephone and the potential availability of an in-person appointment in the future. The patient expressed understanding and agreed to proceed.   Subjective:  Jame Vanluven is a 78 y.o. female patient of Remus Loffler, PA-C who had a Medicare Annual Wellness Visit today via telephone. Terri is Retired and lives alone. she has 3 children. she reports that she is socially active and does interact with friends/family regularly. she is moderately physically active and enjoys crossword puzzles, exchanging recipes .  Patient Care Team: Caryl Never as PCP - General (Physician Assistant) Leslye Peer, MD as Consulting Physician (Pulmonary Disease)  No flowsheet data found.  Hospital Utilization Over the Past 12 Months: # of hospitalizations or ER visits: 1 # of surgeries: 0  Review of Systems    Patient reports that her overall health is unchanged compared to last year.  Patient Reported Readings (BP, Pulse, CBG, Weight, etc) none  Review of Systems: No complaints.  All other systems negative.  Pain Assessment Pain : No/denies pain     Current Medications & Allergies (verified) Allergies as of 05/19/2019      Reactions   Bee Venom Anaphylaxis      Medication List       Accurate as of May 19, 2019  1:54 PM. If you have any questions, ask your nurse or doctor.        amLODipine 5 MG tablet Commonly known as: NORVASC Take 1 tablet (5 mg total) by mouth daily.   Fluticasone-Umeclidin-Vilant 100-62.5-25  MCG/INH Aepb Commonly known as: Trelegy Ellipta Inhale 1 puff into the lungs daily.   guaiFENesin 600 MG 12 hr tablet Commonly known as: MUCINEX Take 600 mg by mouth as needed.   hydrochlorothiazide 25 MG tablet Commonly known as: HYDRODIURIL Take 1 tablet (25 mg total) by mouth daily.   omeprazole 20 MG capsule Commonly known as: PRILOSEC Take 1 capsule (20 mg total) by mouth daily.   potassium chloride 10 MEQ tablet Commonly known as: K-DUR Take 1 tablet (10 mEq total) by mouth 2 (two) times daily.   ProAir HFA 108 (90 Base) MCG/ACT inhaler Generic drug: albuterol Inhale 2 puffs into the lungs every 6 (six) hours as needed.   albuterol (2.5 MG/3ML) 0.083% nebulizer solution Commonly known as: PROVENTIL Take 3 mLs (2.5 mg total) by nebulization every 6 (six) hours as needed for wheezing or shortness of breath. DX J43.8   Spacer/Aero Chamber [employer] Use as directed       History (reviewed): Past Medical History:  Diagnosis Date  . COPD (chronic obstructive pulmonary disease) (HCC)   . GERD (gastroesophageal reflux disease)   . Hypertension    Past Surgical History:  Procedure Laterality Date  . CHOLECYSTECTOMY    . CYSTECTOMY     benign cysts removed from head and R hand  . KNEE ARTHROSCOPY     Left   Family History  Problem Relation Age of Onset  . Emphysema Father   . COPD Father   . Stroke Mother   . Diabetes Son    Social History   Socioeconomic History  .  Marital status: Widowed    Spouse name: Tiffay Pinette  . Number of children: 3  . Years of education: 16  . Highest education level: Bachelor's degree (e.g., BA, AB, BS)  Occupational History  . 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 Financial resource strain: Not hard at all  . Food insecurity    Worry: Never true    Inability: Never true  . Transportation needs    Medical: No    Non-medical: No  Tobacco Use  . Smoking status: Former Smoker     Packs/day: 1.00    Years: 40.00    Pack years: 40.00    Types: Cigarettes    Quit date: 09/23/2002    Years since quitting: 16.6  . Smokeless tobacco: Never Used  Substance and Sexual Activity  . Alcohol use: Yes    Frequency: Never    Comment: occ  . Drug use: Never  . Sexual activity: Not on file  Lifestyle  . Physical activity    Days per week: 0 days    Minutes per session: 0 min  . Stress: Not at all  Relationships  . Social connections    Talks on phone: More than three times a week    Gets together: Twice a week    Attends religious service: Never    Active member of club or organization: No    Attends meetings of clubs or organizations: Never    Relationship status: Not on file  Other Topics Concern  . Not on file  Social History Narrative  . Not on file    Activities of Daily Living In your present state of health, do you have any difficulty performing the following activities: 05/19/2019  Hearing? N  Vision? N  Difficulty concentrating or making decisions? N  Walking or climbing stairs? Y  Comment Chooses not to use stairs due to carrying oxygen with her.  Dressing or bathing? N  Doing errands, shopping? N  Preparing Food and eating ? N  Using the Toilet? N  In the past six months, have you accidently leaked urine? Y  Comment When coughing.  Do you have problems with loss of bowel control? N  Managing your Medications? N  Managing your Finances? N  Some recent data might be hidden    Patient Education/ Literacy How often do you need to have someone help you when you read instructions, pamphlets, or other written materials from your doctor or pharmacy?: 1 - Never What is the last grade level you completed in school?: college , 4 year  Exercise Current Exercise Habits: Home exercise routine(Keeping house clean and going to get the paper.), Type of exercise: walking, Time (Minutes): 20, Frequency (Times/Week): 7, Weekly Exercise (Minutes/Week): 140,  Intensity: Mild, Exercise limited by: respiratory conditions(s)  Diet Patient reports consuming 3 meals a day and 1 snack(s) a day Patient reports that her primary diet is: Regular Patient reports that she does have regular access to food.   Depression Screen PHQ 2/9 Scores 05/19/2019 05/05/2019  PHQ - 2 Score 0 0     Fall Risk Fall Risk  05/19/2019 05/05/2019  Falls in the past year? 0 1  Number falls in past yr: - 1  Injury with Fall? - 1  Risk for fall due to : (No Data) Impaired balance/gait;History of fall(s)  Risk for fall due to: Comment On oxygen with tubing.   Has a cat in the home. -  Objective:  Clayborne DanaBrenda Febles seemed alert and oriented and she participated appropriately during our telephone visit.  Blood Pressure Weight BMI  BP Readings from Last 3 Encounters:  05/05/19 122/76  04/26/19 (!) 142/86  10/01/18 120/84   Wt Readings from Last 3 Encounters:  05/05/19 203 lb (92.1 kg)  04/26/19 209 lb (94.8 kg)  10/01/18 202 lb (91.6 kg)   BMI Readings from Last 1 Encounters:  05/05/19 32.77 kg/m    *Unable to obtain current vital signs, weight, and BMI due to telephone visit type  Hearing/Vision  . Steward DroneBrenda did not seem to have difficulty with hearing/understanding during the telephone conversation . Reports that she has not had a formal eye exam by an eye care professional within the past year . Reports that she has not had a formal hearing evaluation within the past year *Unable to fully assess hearing and vision during telephone visit type  Cognitive Function: 6CIT Screen 05/19/2019  What Year? 0 points  What month? 0 points  What time? 0 points  Count back from 20 0 points  Months in reverse 0 points  Repeat phrase 0 points  Total Score 0   (Normal:0-7, Significant for Dysfunction: >8)  Normal Cognitive Function Screening: Yes   Immunization & Health Maintenance Record Immunization History  Administered Date(s) Administered  . Influenza Whole  07/05/2014  . Influenza, High Dose Seasonal PF 07/03/2015, 06/26/2016, 06/02/2017, 07/07/2018  . Influenza, Seasonal, Injecte, Preservative Fre 06/26/2015  . Influenza,inj,Quad PF,6+ Mos 06/23/2013, 06/26/2015  . Influenza-Unspecified 07/20/2014  . Pneumococcal Conjugate-13 03/30/2015, 10/10/2015  . Pneumococcal Polysaccharide-23 09/24/2011, 06/23/2012  . Tdap 09/07/2018    Health Maintenance  Topic Date Due  . INFLUENZA VACCINE  04/24/2019  . DEXA SCAN  05/18/2020 (Originally 01/06/2006)  . TETANUS/TDAP  09/07/2028  . PNA vac Low Risk Adult  Completed       Assessment  This is a routine wellness examination for Clayborne DanaBrenda Belles.  Health Maintenance: Due or Overdue Health Maintenance Due  Topic Date Due  . INFLUENZA VACCINE  04/24/2019    Clayborne DanaBrenda Morro does not need a referral for Community Assistance: Care Management:   no Social Work:    no Prescription Assistance:  no Nutrition/Diabetes Education:  no   Plan:  Personalized Goals Goals Addressed            This Visit's Progress   . DIET - EAT MORE FRUITS AND VEGETABLES      . Exercise 3x per week (30 min per time)       Has continuous oxygen  and is limited with excersion.     Move arms and legs more while sitting.      Personalized Health Maintenance & Screening Recommendations  Influenza vaccine  Lung Cancer Screening Recommended: no (Low Dose CT Chest recommended if Age 64-80 years, 30 pack-year currently smoking OR have quit w/in past 15 years) Hepatitis C Screening recommended: no HIV Screening recommended: no  Advanced Directives: Written information Packet with information about living will to be sent. prepared per patient's request.  Referrals & Orders No orders of the defined types were placed in this encounter.   Follow-up Plan . Follow-up with Remus LofflerJones, Angel S, PA-C as planned . Schedule   I have personally reviewed and noted the following in the patient's chart:   . Medical and social history  . Use of alcohol, tobacco or illicit drugs  . Current medications and supplements . Functional ability and status . Nutritional status . Physical activity . Advanced  directives . List of other physicians . Hospitalizations, surgeries, and ER visits in previous 12 months . Vitals . Screenings to include cognitive, depression, and falls . Referrals and appointments  In addition, I have reviewed and discussed with Clayborne DanaBrenda Loyal certain preventive protocols, quality metrics, and best practice recommendations. A written personalized care plan for preventive services as well as general preventive health recommendations is available and can be mailed to the patient at her request.      Tonie GriffithStone, Kashina Mecum Murray LPN [id number]/26/2020

## 2019-07-07 ENCOUNTER — Other Ambulatory Visit: Payer: Self-pay | Admitting: Emergency Medicine

## 2019-07-09 ENCOUNTER — Telehealth: Payer: Self-pay | Admitting: Emergency Medicine

## 2019-07-09 MED ORDER — TRELEGY ELLIPTA 100-62.5-25 MCG/INH IN AEPB: 1.0000 | INHALATION_SPRAY | Freq: Every day | RESPIRATORY_TRACT | 1 refills | Status: DC

## 2019-07-09 MED ORDER — ALBUTEROL SULFATE HFA 108 (90 BASE) MCG/ACT IN AERS: 2.0000 | INHALATION_SPRAY | Freq: Four times a day (QID) | RESPIRATORY_TRACT | 5 refills | Status: DC | PRN

## 2019-07-09 NOTE — Telephone Encounter (Signed)
Called [facility] and spoke with [place] who reported that patient's Proventil HFA is $5.  Verified with Broadus John 3 times the cost.  Broadus John also reported that patient's Trelegy is ready for her to pick - no cost changes on this Rx.  Patient has 1 more refill on both.  Called spoke with patient, informed her of the above.  Patient voiced her understanding.  Advised patient that if there is any discrepancy when she gets to the pharmacy to ask to speak with Broadus John and/or have the pharmacy call this office.  Patient voiced her understanding.  Refills sent on her Proventil and Trelegy. Nothing further needed at this time; will sign off.

## 2019-07-29 ENCOUNTER — Other Ambulatory Visit: Payer: Self-pay | Admitting: Emergency Medicine

## 2019-10-21 ENCOUNTER — Other Ambulatory Visit: Payer: Self-pay

## 2019-10-21 ENCOUNTER — Encounter: Payer: Self-pay | Admitting: Emergency Medicine

## 2019-10-21 ENCOUNTER — Ambulatory Visit (INDEPENDENT_AMBULATORY_CARE_PROVIDER_SITE_OTHER): Payer: Medicare Other | Admitting: Emergency Medicine

## 2019-10-21 DIAGNOSIS — J9611 Chronic respiratory failure with hypoxia: Secondary | ICD-10-CM | POA: Diagnosis not present

## 2019-10-21 DIAGNOSIS — J438 Other emphysema: Secondary | ICD-10-CM | POA: Diagnosis not present

## 2019-10-21 NOTE — Progress Notes (Signed)
Virtual Visit via Telephone Note  I connected with [name] on 10/21/19 at 10:30 AM EST by telephone and verified that I am speaking with the correct person using two identifiers.  Location: Patient: Home Provider: Office   I discussed the limitations, risks, security and privacy concerns of performing an evaluation and management service by telephone and the availability of in person appointments. I also discussed with the patient that there may be a patient responsible charge related to this service. The patient expressed understanding and agreed to proceed.   History of Present Illness: 79 year old woman with severe COPD, severe obstruction with associated chronic hypoxemic respiratory failure.  She uses oxygen at 4 to 6 L/min.  We have maintained her on Trelegy for about the last year.     Observations/Objective: She reports today that she has overall been stable. Her exertional tolerance is very limited. Her breathing varies day to day, often associated with humidity. She wears her O2 reliably. She is able to do some chores, gets help from her kids. She has cough occasionally, not always productive, white mucous. No blood. No wheeze. No flares, abx or pred since last visit. She uses albuterol 1-3x a day.   Assessment and Plan: COPD (chronic obstructive pulmonary disease) with emphysema Plan to continue trelegy, albuterol.  No exacerbations. No changes in therapy Flu shot up to date She has hx anaphylaxis to bee stings - should be able to tolerate COVID vaccine. She is working on arranging.    Hypoxemic respiratory failure, chronic (HCC) Continue O2 at 4-6L/min depending on level of exertion.     Follow Up Instructions: 6 months.    I discussed the assessment and treatment plan with the patient. The patient was provided an opportunity to ask questions and all were answered. The patient agreed with the plan and demonstrated an understanding of the instructions.   The patient  was advised to call back or seek an in-person evaluation if the symptoms worsen or if the condition fails to improve as anticipated.  I provided 15 minutes of non-face-to-face time during this encounter.   Leslye Peer, MD

## 2019-10-21 NOTE — Assessment & Plan Note (Signed)
Continue O2 at 4-6L/min depending on level of exertion.

## 2019-10-21 NOTE — Assessment & Plan Note (Signed)
Plan to continue trelegy, albuterol.  No exacerbations. No changes in therapy Flu shot up to date She has hx anaphylaxis to bee stings - should be able to tolerate COVID vaccine. She is working on arranging.

## 2019-11-05 ENCOUNTER — Ambulatory Visit (INDEPENDENT_AMBULATORY_CARE_PROVIDER_SITE_OTHER): Payer: Medicare Other | Admitting: Physician Assistant

## 2019-11-05 ENCOUNTER — Encounter: Payer: Self-pay | Admitting: Physician Assistant

## 2019-11-05 DIAGNOSIS — J309 Allergic rhinitis, unspecified: Secondary | ICD-10-CM | POA: Diagnosis not present

## 2019-11-05 DIAGNOSIS — K219 Gastro-esophageal reflux disease without esophagitis: Secondary | ICD-10-CM

## 2019-11-05 DIAGNOSIS — J438 Other emphysema: Secondary | ICD-10-CM

## 2019-11-05 DIAGNOSIS — I10 Essential (primary) hypertension: Secondary | ICD-10-CM | POA: Diagnosis not present

## 2019-11-05 MED ORDER — TRIAMCINOLONE ACETONIDE 55 MCG/ACT NA AERO: 2.0000 | INHALATION_SPRAY | Freq: Every day | NASAL | 12 refills | Status: DC

## 2019-11-05 NOTE — Progress Notes (Signed)
Telephone visit  Subjective: CC: Chronic medical conditions PCP: Remus Loffler, PA-C VVO:[id number] Danielle Harmon is a 79 y.o. female calls for telephone consult today. Patient provides verbal consent for consult held via phone.  Patient is identified with 2 separate identifiers.  At this time the entire area is on COVID-19 social distancing and stay home orders are in place.  Patient is of higher risk and therefore we are performing this by a virtual method.  Location of patient: home Location of provider: [facility] Others present for call: no  No results found for: HGBA1C     This patient is having a follow-up for chronic medical conditions.  They do include hypertension, allergic rhinitis, COPD, GERD.  At this point the patient states that she is feeling pretty good.  She does get out of breath a lot when she has to walk a long way.  She had asked in the future when she comes her office is someone can help her with a wheelchair to get in.  Come this is very appropriate just to call the office when she is on her way.  She is under the care of Dr. Delton Coombes, pulmonology.  She only saw him by telephone visit.  She states she really wanted to go into the office but could not.  She states that overall she is feeling very good with everything.  We have reviewed her labs.  They were minimally out of range.  There are no other complaints at this time.  ROS: Per HPI  Allergies  Allergen Reactions  . Bee Venom Anaphylaxis   Past Medical History:  Diagnosis Date  . COPD (chronic obstructive pulmonary disease) (HCC)   . GERD (gastroesophageal reflux disease)   . Hypertension     Current Outpatient Medications:  .  albuterol (VENTOLIN HFA) 108 (90 Base) MCG/ACT inhaler, USE 2 PUFFS EVERY 6 HOURS AS NEEEDED FOR SHORTNESS OF BREATH, Disp: 18 g, Rfl: 5 .  amLODipine (NORVASC) 5 MG tablet, Take 1 tablet (5 mg total) by mouth daily., Disp: 90 tablet, Rfl: 3 .  Fluticasone-Umeclidin-Vilant (TRELEGY  ELLIPTA) 100-62.5-25 MCG/INH AEPB, Inhale 1 puff into the lungs daily., Disp: 180 each, Rfl: 1 .  guaiFENesin (MUCINEX) 600 MG 12 hr tablet, Take 600 mg by mouth as needed. , Disp: , Rfl:  .  hydrochlorothiazide (HYDRODIURIL) 25 MG tablet, Take 1 tablet (25 mg total) by mouth daily., Disp: 90 tablet, Rfl: 3 .  omeprazole (PRILOSEC) 20 MG capsule, Take 1 capsule (20 mg total) by mouth daily., Disp: 90 capsule, Rfl: 3 .  potassium chloride (K-DUR) 10 MEQ tablet, Take 1 tablet (10 mEq total) by mouth 2 (two) times daily., Disp: 180 tablet, Rfl: 3 .  Spacer/Aero Chamber Mouthpiece MISC, Use as directed, Disp: 1 each, Rfl: 0 .  triamcinolone (NASACORT) 55 MCG/ACT AERO nasal inhaler, Place 2 sprays into the nose daily., Disp: 1 Inhaler, Rfl: 12  Assessment/ Plan: 79 y.o. female   1. Allergic rhinitis, unspecified seasonality, unspecified trigger - triamcinolone (NASACORT) 55 MCG/ACT AERO nasal inhaler; Place 2 sprays into the nose daily.  Dispense: 1 Inhaler; Refill: 12  2. Gastro-esophageal reflux disease without esophagitis Continue omeprazole  3. Other emphysema (HCC) Continue Trelegy Continue albuterol  4. Essential hypertension Continue hydrochlorothiazide Continue amlodipine   Return in about 2 months (around 01/03/2020).  Continue all other maintenance medications as listed above.  Start time: 10:04 AM End time: 10:16 AM  Meds ordered this encounter  Medications  .  triamcinolone (NASACORT) 55 MCG/ACT AERO nasal inhaler    Sig: Place 2 sprays into the nose daily.    Dispense:  1 Inhaler    Refill:  12    Order Specific Question:   Supervising Provider    Answer:   Janora Norlander [[id number]]    Particia Nearing PA-C [place] [phone]

## 2020-01-03 ENCOUNTER — Ambulatory Visit (INDEPENDENT_AMBULATORY_CARE_PROVIDER_SITE_OTHER): Payer: Medicare Other | Admitting: Nurse Practitioner

## 2020-01-03 ENCOUNTER — Other Ambulatory Visit: Payer: Self-pay

## 2020-01-03 ENCOUNTER — Ambulatory Visit: Payer: Medicare Other | Admitting: Physician Assistant

## 2020-01-03 ENCOUNTER — Encounter: Payer: Self-pay | Admitting: Nurse Practitioner

## 2020-01-03 VITALS — BP 136/80 | HR 90 | Temp 97.3°F | Resp 20 | Ht 66.0 in | Wt 201.0 lb

## 2020-01-03 DIAGNOSIS — I10 Essential (primary) hypertension: Secondary | ICD-10-CM | POA: Diagnosis not present

## 2020-01-03 DIAGNOSIS — K219 Gastro-esophageal reflux disease without esophagitis: Secondary | ICD-10-CM

## 2020-01-03 DIAGNOSIS — J438 Other emphysema: Secondary | ICD-10-CM | POA: Diagnosis not present

## 2020-01-03 DIAGNOSIS — Z6832 Body mass index (BMI) 32.0-32.9, adult: Secondary | ICD-10-CM

## 2020-01-03 DIAGNOSIS — M545 Low back pain, unspecified: Secondary | ICD-10-CM

## 2020-01-03 LAB — CMP14+EGFR
ALT: 14 IU/L (ref 0–32)
AST: 20 IU/L (ref 0–40)
Albumin/Globulin Ratio: 1.6 (ref 1.2–2.2)
Albumin: 4.1 g/dL (ref 3.7–4.7)
Alkaline Phosphatase: 107 IU/L (ref 39–117)
BUN/Creatinine Ratio: 13 (ref 12–28)
BUN: 10 mg/dL (ref 8–27)
Bilirubin Total: 0.4 mg/dL (ref 0.0–1.2)
CO2: 28 mmol/L (ref 20–29)
Calcium: 9.7 mg/dL (ref 8.7–10.3)
Chloride: 92 mmol/L — ABNORMAL LOW (ref 96–106)
Creatinine, Ser: 0.75 mg/dL (ref 0.57–1.00)
GFR calc Af Amer: 88 mL/min/{1.73_m2} (ref >59)
GFR calc non Af Amer: 77 mL/min/{1.73_m2} (ref >59)
Globulin, Total: 2.5 g/dL (ref 1.5–4.5)
Glucose: 92 mg/dL (ref 65–99)
Potassium: 4 mmol/L (ref 3.5–5.2)
Sodium: 137 mmol/L (ref 134–144)
Total Protein: 6.6 g/dL (ref 6.0–8.5)

## 2020-01-03 LAB — CBC WITH DIFFERENTIAL/PLATELET
Basophils Absolute: 0.1 10*3/uL (ref 0.0–0.2)
Basos: 1 %
EOS (ABSOLUTE): 0.1 10*3/uL (ref 0.0–0.4)
Eos: 1 %
Hematocrit: 40.3 % (ref 34.0–46.6)
Hemoglobin: 13.6 g/dL (ref 11.1–15.9)
Immature Grans (Abs): 0 10*3/uL (ref 0.0–0.1)
Immature Granulocytes: 0 %
Lymphocytes Absolute: 1.6 10*3/uL (ref 0.7–3.1)
Lymphs: 19 %
MCH: 30 pg (ref 26.6–33.0)
MCHC: 33.7 g/dL (ref 31.5–35.7)
MCV: 89 fL (ref 79–97)
Monocytes Absolute: 0.7 10*3/uL (ref 0.1–0.9)
Monocytes: 8 %
Neutrophils Absolute: 5.7 10*3/uL (ref 1.4–7.0)
Neutrophils: 71 %
Platelets: 255 10*3/uL (ref 150–450)
RBC: 4.53 x10E6/uL (ref 3.77–5.28)
RDW: 12.4 % (ref 11.7–15.4)
WBC: 8.1 10*3/uL (ref 3.4–10.8)

## 2020-01-03 LAB — LIPID PANEL
Chol/HDL Ratio: 2.1 ratio (ref 0.0–4.4)
Cholesterol, Total: 188 mg/dL (ref 100–199)
HDL: 88 mg/dL (ref >39)
LDL Chol Calc (NIH): 88 mg/dL (ref 0–99)
Triglycerides: 62 mg/dL (ref 0–149)
VLDL Cholesterol Cal: 12 mg/dL (ref 5–40)

## 2020-01-03 MED ORDER — DICLOFENAC SODIUM 1 % EX GEL: 4.0000 g | Freq: Four times a day (QID) | CUTANEOUS | 1 refills | Status: DC

## 2020-01-03 NOTE — Patient Instructions (Signed)

## 2020-01-03 NOTE — Progress Notes (Signed)
Subjective:    Patient ID: Danielle Harmon, female    DOB: 04-30-1941, 79 y.o.   MRN: [id number]   Chief Complaint: Medical Management of Chronic Issues (low back pain)    HPI:  1. Essential hypertension No c/o chest pain,  or headache. Does not check blood pressure at home. BP Readings from Last 3 Encounters:  01/03/20 (!) 165/91  05/05/19 122/76  04/26/19 (!) 142/86     2. Other emphysema (Danielle Harmon) She has SOB when she has SOB when she is active. She uses her trelegy and uses albuterol as needed. She sees Danielle Harmon pulmonology every 6 months. Last visit was in early January by telephone. She is on continuous O@ via nasal cannula at 5L portable.  3. Gastro-esophageal reflux disease without esophagitis She is on omeprazole dialy andthat seems to keep her symptoms under good contol.  4. BMI 32.0-32.9,adult No recent weight changes.  Wt Readings from Last 3 Encounters:  01/03/20 201 lb (91.2 kg)  05/05/19 203 lb (92.1 kg)  04/26/19 209 lb (94.8 kg)   BMI Readings from Last 3 Encounters:  01/03/20 32.44 kg/m  05/05/19 32.77 kg/m  04/26/19 33.73 kg/m       Outpatient Encounter Medications as of 01/03/2020  Medication Sig  . albuterol (VENTOLIN HFA) 108 (90 Base) MCG/ACT inhaler USE 2 PUFFS EVERY 6 HOURS AS NEEEDED FOR SHORTNESS OF BREATH  . amLODipine (NORVASC) 5 MG tablet Take 1 tablet (5 mg total) by mouth daily.  . Fluticasone-Umeclidin-Vilant (TRELEGY ELLIPTA) 100-62.5-25 MCG/INH AEPB Inhale 1 puff into the lungs daily.  Marland Kitchen guaiFENesin (MUCINEX) 600 MG 12 hr tablet Take 600 mg by mouth as needed.   . hydrochlorothiazide (HYDRODIURIL) 25 MG tablet Take 1 tablet (25 mg total) by mouth daily.  Marland Kitchen omeprazole (PRILOSEC) 20 MG capsule Take 1 capsule (20 mg total) by mouth daily.  . potassium chloride (K-DUR) 10 MEQ tablet Take 1 tablet (10 mEq total) by mouth 2 (two) times daily.  Marland Kitchen Spacer/Aero Chamber Danielle Harmon Use as directed  . triamcinolone (NASACORT) 55 MCG/ACT AERO  nasal inhaler Place 2 sprays into the nose daily.    Past Surgical History:  Procedure Laterality Date  . CHOLECYSTECTOMY    . CYSTECTOMY     benign cysts removed from head and R hand  . KNEE ARTHROSCOPY     Left    Danielle Harmon History  Problem Relation Age of Onset  . Emphysema Danielle Harmon   . COPD Danielle Harmon   . Stroke Mother   . Diabetes Danielle Harmon     New complaints: She is c/o low back pain today. This started about 3 weeks ago.. she said she was moving a big box of cat liter when it started. She rates the pain/  Currently. To  Much activity increase pain. She take occasional ibuprofen which helps some.  Social history: Lives by herself. Her daughter checks on her 2x a day.  Controlled substance contract: n/a    Review of Systems  Constitutional: Negative for diaphoresis.  Eyes: Negative for pain.  Respiratory: Negative for shortness of breath.   Cardiovascular: Negative for chest pain, palpitations and leg swelling.  Gastrointestinal: Negative for abdominal pain.  Endocrine: Negative for polydipsia.  Skin: Negative for rash.  Neurological: Negative for dizziness, weakness and headaches.  Hematological: Does not bruise/bleed easily.  All other systems reviewed and are negative.      Objective:   Physical Exam Vitals and nursing note reviewed.  Constitutional:      General: She is  not in acute distress.    Appearance: Normal appearance. She is well-developed.  HENT:     Head: Normocephalic.     Nose: Nose normal.  Eyes:     Pupils: Pupils are equal, round, and reactive to light.  Neck:     Vascular: No carotid bruit or JVD.  Cardiovascular:     Rate and Rhythm: Normal rate and regular rhythm.     Heart sounds: Normal heart sounds.  Pulmonary:     Effort: Pulmonary effort is normal. No respiratory distress.     Breath sounds: Normal breath sounds. No wheezing or rales.     Comments: o2 via nasal cannula at 5L Chest:     Chest wall: No tenderness.  Abdominal:      General: Bowel sounds are normal. There is no distension or abdominal bruit.     Palpations: Abdomen is soft. There is no hepatomegaly, splenomegaly, mass or pulsatile mass.     Tenderness: There is no abdominal tenderness.  Musculoskeletal:        General: Normal range of motion.     Cervical back: Normal range of motion and neck supple.  Lymphadenopathy:     Cervical: No cervical adenopathy.  Skin:    General: Skin is warm and dry.  Neurological:     Mental Status: She is alert and oriented to person, place, and time.     Deep Tendon Reflexes: Reflexes are normal and symmetric.  Psychiatric:        Behavior: Behavior normal.        Thought Content: Thought content normal.        Judgment: Judgment normal.       BP 136/80 (BP Location: Left Arm, Cuff Size: Large)   Pulse 90   Temp (!) 97.3 F (36.3 C) (Temporal)   Resp 20   Ht 5\' 6"  (1.676 m)   Wt 201 lb (91.2 kg)   SpO2 98% Comment: On 5 liters O2  BMI 32.44 kg/m       Assessment & Plan:  Danielle Harmon comes in today with chief complaint of Medical Management of Chronic Issues (low back pain)   Diagnosis and orders addressed:  1. Essential hypertension low sodium diet  2. Other emphysema (HCC) Keep follow up with pulmonology Continue O2 as prescribed  3. Gastro-esophageal reflux disease without esophagitis Avoid spicy foods Do not eat 2 hours prior to bedtime  4. BMI 32.0-32.9,adult Discussed diet and exercise for person with BMI >25 Will recheck weight in 3-6 months  5. Acute midline low back pain without sciatica Moist heat Rest - diclofenac Sodium (VOLTAREN) 1 % GEL; Apply 4 g topically 4 (four) times daily.  Dispense: 350 g; Refill: 1   Labs pending Health Maintenance reviewed Diet and exercise encouraged  Follow up plan: 6 months   Danielle Harmon 02-12-1995, FNP

## 2020-03-02 ENCOUNTER — Ambulatory Visit (INDEPENDENT_AMBULATORY_CARE_PROVIDER_SITE_OTHER): Payer: Medicare Other | Admitting: Emergency Medicine

## 2020-03-02 ENCOUNTER — Other Ambulatory Visit: Payer: Self-pay

## 2020-03-02 ENCOUNTER — Encounter: Payer: Self-pay | Admitting: Emergency Medicine

## 2020-03-02 DIAGNOSIS — J301 Allergic rhinitis due to pollen: Secondary | ICD-10-CM

## 2020-03-02 DIAGNOSIS — J438 Other emphysema: Secondary | ICD-10-CM | POA: Diagnosis not present

## 2020-03-02 MED ORDER — BREZTRI AEROSPHERE 160-9-4.8 MCG/ACT IN AERO: 2.0000 | INHALATION_SPRAY | Freq: Two times a day (BID) | RESPIRATORY_TRACT | 0 refills | Status: DC

## 2020-03-02 NOTE — Patient Instructions (Addendum)
We will try starting Breztri to see if you get better delivery, more benefit. Please call our office if so. We may decide to change to this medication.  Once the Breztri samples are finished, continue Trelegy one inhalation once a day (unless we decide to make a switch).  Rinse and gargle after using Continue albuterol 2 puffs up to every 4 hours if needed shortness breath, chest tightness, wheezing.  Try taking this about 15 to 20 minutes before your usual Trelegy every day to help enhance the delivery of Trelegy. Continue Mucinex twice a day Use your Nasacort as you need it for symptoms of nasal congestion, allergies COVID-19 vaccine up-to-date Pneumonia vaccine up-to-date Continue oxygen at 6L/min.  Follow with Dr Delton Coombes in 4 months or sooner if you have any problems.

## 2020-03-02 NOTE — Assessment & Plan Note (Signed)
Use your Nasacort as you need it for symptoms of nasal congestion, allergies

## 2020-03-02 NOTE — Assessment & Plan Note (Signed)
We will try starting Breztri to see if you get better delivery, more benefit. Please call our office if so. We may decide to change to this medication.  Once the Breztri samples are finished, continue Trelegy one inhalation once a day (unless we decide to make a switch).  Rinse and gargle after using Continue albuterol 2 puffs up to every 4 hours if needed shortness breath, chest tightness, wheezing.  Try taking this about 15 to 20 minutes before your usual Trelegy every day to help enhance the delivery of Trelegy. Continue Mucinex twice a day COVID-19 vaccine up-to-date Pneumonia vaccine up-to-date Follow with Dr Delton Coombes in 4 months or sooner if you have any problems

## 2020-03-02 NOTE — Addendum Note (Signed)
Addended by: Dorisann Frames R on: 03/02/2020 01:28 PM   Modules accepted: Orders

## 2020-03-02 NOTE — Progress Notes (Signed)
   Subjective:    Patient ID: Danielle Harmon, female    DOB: December 19, 1940, 79 y.o.   MRN: [id number]  HPI  ROV 03/02/20 --follow-up visit for 79 year old woman with severe COPD, severe obstruction by spirometry 2012, associated chronic hypoxemic respiratory failure.  She uses oxygen at 6 L/min. I last spoke to her on a tele-visit 10/21/2019.  She has been managed on Trelegy.  She uses albuterol approximately 2-3x a day. Does not use her nebulizer.  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  Pneumonia vaccine up to date  Review of Systems As per HPI     Objective:   Physical Exam  Vitals:   03/02/20 1217  BP: (!) 142/72  Pulse: 89  Temp: 97.9 F (36.6 C)  TempSrc: Oral  SpO2: 98%  Weight: 201 lb (91.2 kg)  Height: 5\' 4"  (1.626 m)   Gen: Pleasant, well-nourished, in no distress in wheelchair,  normal affect, O2 in place  ENT: No lesions,  mouth clear,  oropharynx clear, no postnasal drip  Neck: No JVD, no stridor  Lungs: No use of accessory muscles, good air movement, no wheezing or crackles  Cardiovascular: RRR, heart sounds normal, no murmur or gallops, 2+ LE peripheral edema w compression stockings  Musculoskeletal: No deformities, no cyanosis or clubbing  Neuro: alert, non focal  Skin: Warm, no lesions or rashes      Assessment & Plan:  COPD (chronic obstructive pulmonary disease) with emphysema We will try starting Breztri to see if you get better delivery, more benefit. Please call our office if so. We may decide to change to this medication.  Once the Breztri samples are finished, continue Trelegy one inhalation once a day (unless we decide to make a switch).  Rinse and gargle after using Continue albuterol 2 puffs up to every 4 hours if needed shortness breath, chest tightness, wheezing.  Try taking this about 15 to 20 minutes before your usual Trelegy every day to help  enhance the delivery of Trelegy. Continue Mucinex twice a day COVID-19 vaccine up-to-date Pneumonia vaccine up-to-date Follow with Dr in 4 months or sooner if you have any problems  Allergic rhinitis Use your Nasacort as you need it for symptoms of nasal congestion, allergies  Delton Coombes, MD, PhD 03/02/2020, 12:38 PM [facility] [phone] or if no answer [phone]

## 2020-03-10 ENCOUNTER — Telehealth: Payer: Self-pay | Admitting: Emergency Medicine

## 2020-03-10 NOTE — Telephone Encounter (Signed)
Spoke with patient, she states she is requiring more than 6L of oxygen and her portable oxygen tank only goes up to 6L and it is pulsed.  She was advised that she needed to schedule an office visit for a qualifying walk to see how much portable continuous oxygen she needs.  I attempted to schedule an appointment with one of our NPs, however, she needs to speak with her children as she does not drive and depends on them for transportation. She will call the office to schedule a face to face appointment after she has spoken with her children.

## 2020-03-10 NOTE — Telephone Encounter (Signed)
Pt returning a phone call. Pt can be reached at [phone].

## 2020-03-10 NOTE — Telephone Encounter (Signed)
Pt calling again-- got confused and needs a call back to have the o2 instructions explained to her again.

## 2020-03-13 NOTE — Telephone Encounter (Signed)
Called and spoke with patient. She wants to know what her options would be once she requires more than 6 liters oxygen because she is currently at her max on her POC. Community message sent to [employer]. Will wait for response.

## 2020-03-14 NOTE — Telephone Encounter (Signed)
Lollie Sails, CMA; Mariann Laster, [facility],   I have reached out to [facility] to help answer this question for you.   Thanks!  ------------------------------------------------------------------ Will await Melissa's response.

## 2020-03-16 NOTE — Telephone Encounter (Addendum)
Lollie Sails, CMA; Mariann Laster, [facility],   Per [place], The patient would then need to be re-assessed for a possible prescription change. The only option would be the patient would have to go to a continuous flow. If the patient is already that high she needs to be on continuous that means she is not getting enough oxygen to maintain sats while exerting.   Thanks!  -------------------------------------------------- [facility] x1 for pt.

## 2020-03-17 ENCOUNTER — Telehealth: Payer: Self-pay | Admitting: Emergency Medicine

## 2020-03-17 MED ORDER — BREZTRI AEROSPHERE 160-9-4.8 MCG/ACT IN AERO: 2.0000 | INHALATION_SPRAY | Freq: Two times a day (BID) | RESPIRATORY_TRACT | 5 refills | Status: DC

## 2020-03-17 NOTE — Telephone Encounter (Signed)
Pt called back and states she figured out the dosing for [employer].

## 2020-03-17 NOTE — Telephone Encounter (Signed)
I spoke with the pt earlier about this. Nothing further was needed.

## 2020-03-17 NOTE — Telephone Encounter (Signed)
Spoke with pt. She has been scheduled with Arlys John on 04/06/2020 so that we can re-evaluate her oxygen needs. While on the phone, she requested to have a prescription sent in for [place]. Rx has been sent in. Nothing further was needed.

## 2020-04-04 ENCOUNTER — Other Ambulatory Visit: Payer: Self-pay | Admitting: *Deleted

## 2020-04-04 DIAGNOSIS — I10 Essential (primary) hypertension: Secondary | ICD-10-CM

## 2020-04-04 MED ORDER — HYDROCHLOROTHIAZIDE 25 MG PO TABS: 25.0000 mg | ORAL_TABLET | Freq: Every day | ORAL | 0 refills | Status: DC

## 2020-04-04 MED ORDER — AMLODIPINE BESYLATE 5 MG PO TABS: 5.0000 mg | ORAL_TABLET | Freq: Every day | ORAL | 0 refills | Status: DC

## 2020-04-05 NOTE — Progress Notes (Signed)
@Patient  ID: Danielle Harmon, female    DOB: [birth year], 79 y.o.   MRN: [id number]  Chief Complaint  Patient presents with  . Follow-up    pt states she burning inn tip of toes and is here to get reqialified for oxygen    Referring provider: Bennie PieriniMartin, Mary-Margaret, *  HPI:  79 year old female former smoker followed in our office for COPD and chronic respiratory failure  PMH: Hypertension, GERD Smoker/ Smoking History: Former smoker.  Quit 2004.  40-pack-year smoking history Maintenance: Breztri  Pt of: Dr. Delton CoombesByrum  04/06/2020  - Visit   79 year old female former smoker followed in our office for severe COPD based on spirometry in 2012 as well as chronic hypoxemic respiratory failure.  She uses oxygen chronically at 6 L.  She reports adherence to Trelegy Ellipta.  She is presenting to our office today to be requalified for oxygen.  Patient presenting to office today reporting that at home she needs 8 L continuous in order to maintain oxygen saturations above 88%.  Patient presented to our office on 6 L pulsed via POC.  Oxygen saturations were 81% when ambulating to the room.  Patient prefers to be maintained on Breztri instead of Trelegy Ellipta.  She is received this from the pharmacy and she reports this is affordable.  Patient would like additional rescue inhalers so she can have once kept in her purse and at the house.  We will discuss this today.  She most recently picked up her rescue inhaler yesterday from the pharmacy.  She does not have nebulized meds at home.  We will review if this is an option for her.  2012 pulmonary function testing shows severe obstruction as well as a severe diffusion defect.  Patient has not established with palliative care or hospice.  Walk today in office patient required 8 L of O2 with physical exertion.  Patient's son is interested in at home resources to build to help patient with ADLs and IADLs.  Patient son is also interested in wheelchairs and other  sort of assistive equipment.  Wants more rescue medications  Questionaires / Pulmonary Flowsheets:   ACT:  No flowsheet data found.  MMRC: mMRC Dyspnea Scale mMRC Score  04/06/2020 3    Epworth:  No flowsheet data found.  Tests:   03/01/2011-pulmonary function test-FVC 2.21 (68% predicted), postbronchodilator ratio 41, postbronchodilator FEV1 0.83 (33% predicted), no bronchodilator response, DLCO 9.28 (34% predicted)  02/21/2011-chest x-ray-COPD, no active cardiopulmonary disease  FENO:  No results found for: NITRICOXIDE  PFT: No flowsheet data found.  WALK:  SIX MIN WALK 04/06/2020 03/02/2020 07/17/2017  Supplimental Oxygen during Test? (L/min) - Yes Yes  O2 Flow Rate - 6 3  Type - Continuous Pulse  Tech Comments: pt was walked with the assitance of walker pt had to be put on 6 liters of pulse oxygen pt stats dropped to 85%about 125 feet in walk had to be put on 8 liters of continous oxygen to maintain stats of 95 % Patient was only able to walk short distance before she had to turn around and return to room. pt walked one lap and a half with several breaks at slow pace with assistance walking.    Imaging: No results found.  Lab Results:  CBC    Component Value Date/Time   WBC 8.1 01/03/2020 1105   RBC 4.53 01/03/2020 1105   HGB 13.6 01/03/2020 1105   HCT 40.3 01/03/2020 1105   PLT 255 01/03/2020 1105   MCV 89  01/03/2020 1105   MCH 30.0 01/03/2020 1105   MCHC 33.7 01/03/2020 1105   RDW 12.4 01/03/2020 1105   LYMPHSABS 1.6 01/03/2020 1105   EOSABS 0.1 01/03/2020 1105   BASOSABS 0.1 01/03/2020 1105    BMET    Component Value Date/Time   NA 137 01/03/2020 1105   K 4.0 01/03/2020 1105   CL 92 (L) 01/03/2020 1105   CO2 28 01/03/2020 1105   GLUCOSE 92 01/03/2020 1105   BUN 10 01/03/2020 1105   CREATININE 0.75 01/03/2020 1105   CALCIUM 9.7 01/03/2020 1105   GFRNONAA 77 01/03/2020 1105   GFRAA 88 01/03/2020 1105    BNP No results found for:  BNP  ProBNP No results found for: PROBNP  Specialty Problems      Pulmonary Problems   COPD (chronic obstructive pulmonary disease) with emphysema (HCC)    CXR 2012: hyperinflation PFT's 2012: FEV1 0.85 (34%), ratio 38, TLC normal, DLCO 34% Ambulatory Ox 02/2011:  desat to 86% at very end of walking.  Started on oxygen 2013 by ER.       Allergic rhinitis   Chronic respiratory failure with hypoxia (HCC)      Allergies  Allergen Reactions  . Bee Venom Anaphylaxis    Immunization History  Administered Date(s) Administered  . Influenza Inj Mdck Quad With Preservative 06/29/2019  . Influenza Whole 07/05/2014  . Influenza, High Dose Seasonal PF 07/03/2015, 06/26/2016, 06/02/2017, 07/07/2018  . Influenza, Seasonal, Injecte, Preservative Fre 06/26/2015  . Influenza,inj,Quad PF,6+ Mos 06/23/2013, 06/26/2015  . Influenza-Unspecified 07/20/2014  . Moderna SARS-COVID-2 Vaccination 10/28/2019, 11/26/2019  . Pneumococcal Conjugate-13 03/30/2015, 10/10/2015  . Pneumococcal Polysaccharide-23 09/24/2011, 06/23/2012  . Tdap 09/07/2018    Past Medical History:  Diagnosis Date  . COPD (chronic obstructive pulmonary disease) (HCC)   . GERD (gastroesophageal reflux disease)   . Hypertension     Tobacco History: Social History   Tobacco Use  Smoking Status Former Smoker  . Packs/day: 1.00  . Years: 40.00  . Pack years: 40.00  . Types: Cigarettes  . Quit date: 09/23/2002  . Years since quitting: 17.5  Smokeless Tobacco Never Used   Counseling given: Yes   Continue to not smoke  Outpatient Encounter Medications as of 04/06/2020  Medication Sig  . albuterol (VENTOLIN HFA) 108 (90 Base) MCG/ACT inhaler USE 2 PUFFS EVERY 6 HOURS AS NEEEDED FOR SHORTNESS OF BREATH  . amLODipine (NORVASC) 5 MG tablet Take 1 tablet (5 mg total) by mouth daily.  . Budeson-Glycopyrrol-Formoterol (BREZTRI AEROSPHERE) 160-9-4.8 MCG/ACT AERO Inhale 2 puffs into the lungs 2 (two) times daily.  . diclofenac  Sodium (VOLTAREN) 1 % GEL Apply 4 g topically 4 (four) times daily.  Marland Kitchen guaiFENesin (MUCINEX) 600 MG 12 hr tablet Take 600 mg by mouth as needed.   . hydrochlorothiazide (HYDRODIURIL) 25 MG tablet Take 1 tablet (25 mg total) by mouth daily.  Marland Kitchen omeprazole (PRILOSEC) 20 MG capsule Take 1 capsule (20 mg total) by mouth daily.  . potassium chloride (K-DUR) 10 MEQ tablet Take 1 tablet (10 mEq total) by mouth 2 (two) times daily.  Marland Kitchen Spacer/Aero Chamber [employer] Use as directed  . triamcinolone (NASACORT) 55 MCG/ACT AERO nasal inhaler Place 2 sprays into the nose daily.  Marland Kitchen albuterol (PROVENTIL) (2.5 MG/3ML) 0.083% nebulizer solution Take 3 mLs (2.5 mg total) by nebulization every 6 (six) hours as needed for wheezing or shortness of breath. Pt may use every 4 to 6 hours as needed  . Budeson-Glycopyrrol-Formoterol (BREZTRI AEROSPHERE) 160-9-4.8 MCG/ACT  AERO Inhale 2 puffs into the lungs daily. >>> 2 puffs in the morning right when you wake up, rinse out your mouth after use, 12 hours later 2 puffs, rinse after use >>> Take this daily, no matter what  . [DISCONTINUED] Fluticasone-Umeclidin-Vilant (TRELEGY ELLIPTA) 100-62.5-25 MCG/INH AEPB Inhale 1 puff into the lungs daily.   No facility-administered encounter medications on file as of 04/06/2020.     Review of Systems  Review of Systems  Constitutional: Positive for fatigue. Negative for activity change and fever.  HENT: Negative for sinus pressure, sinus pain and sore throat.   Respiratory: Positive for shortness of breath. Negative for cough and wheezing.   Cardiovascular: Negative for chest pain and palpitations.  Gastrointestinal: Negative for diarrhea, nausea and vomiting.  Musculoskeletal: Positive for gait problem. Negative for arthralgias.  Neurological: Negative for dizziness.  Psychiatric/Behavioral: Negative for sleep disturbance. The patient is not nervous/anxious.      Physical Exam  BP 124/78 (BP Location: Left Arm, Cuff  Size: Normal)   Pulse 96   Temp 98.3 F (36.8 C) (Oral)   Ht 5\' 6"  (1.676 m)   Wt 199 lb (90.3 kg)   SpO2 95%   BMI 32.12 kg/m   Wt Readings from Last 5 Encounters:  04/06/20 199 lb (90.3 kg)  03/02/20 201 lb (91.2 kg)  01/03/20 201 lb (91.2 kg)  05/05/19 203 lb (92.1 kg)  04/26/19 209 lb (94.8 kg)    BMI Readings from Last 5 Encounters:  04/06/20 32.12 kg/m  03/02/20 34.50 kg/m  01/03/20 32.44 kg/m  05/05/19 32.77 kg/m  04/26/19 33.73 kg/m     Physical Exam Vitals and nursing note reviewed.  Constitutional:      General: She is not in acute distress.    Appearance: Normal appearance. She is obese.     Comments: Frail elderly female  HENT:     Head: Normocephalic and atraumatic.     Right Ear: External ear normal.     Left Ear: External ear normal.     Nose: Nose normal. No congestion.     Mouth/Throat:     Mouth: Mucous membranes are moist.     Pharynx: Oropharynx is clear.  Eyes:     Pupils: Pupils are equal, round, and reactive to light.  Cardiovascular:     Rate and Rhythm: Normal rate and regular rhythm.     Pulses: Normal pulses.     Heart sounds: Normal heart sounds. No murmur heard.   Pulmonary:     Breath sounds: No decreased air movement. No decreased breath sounds, wheezing or rales.     Comments: Diminished breath sounds throughout exam Musculoskeletal:        General: Swelling present.     Cervical back: Normal range of motion.     Right lower leg: Edema (2+ lower extremity edema) present.     Left lower leg: Edema (2+ lower extremity edema) present.       Feet:  Feet:     Right foot:     Skin integrity: Skin integrity normal. No dry skin.  Skin:    General: Skin is warm and dry.     Capillary Refill: Capillary refill takes less than 2 seconds.  Neurological:     General: No focal deficit present.     Mental Status: She is alert and oriented to person, place, and time. Mental status is at baseline.     Gait: Gait abnormal (Walks  with walker).  Psychiatric:  Mood and Affect: Mood normal.        Behavior: Behavior normal.        Thought Content: Thought content normal.        Judgment: Judgment normal.       Assessment & Plan:   COPD (chronic obstructive pulmonary disease) with emphysema Plan: Continue Breztri  Walk today in office Continue albuterol rescue inhaler as needed We will place order for albuterol nebulized meds for ease of use for patient Maintain oxygen saturations above 88% Referred to hospice today  Chronic respiratory failure with hypoxia [facility]) Patient reporting she is maintained on 8 L continuous at home Patient presenting to office today on 6 L pulsed Patient ambulating to room oxygen saturations were 81% Walk today in office reveals that patient needs 8 L continuous with physical exertion  Plan:  Patient requires 8 L continuous flow with physical exertion We will contact [employer] to see if there is any other suitable options to help the patient especially with making it to office visits and doctors appointments Patient reporting that home concentrator can handle the capacity of 8 L we will confirm this with [employer]  I have personally contacted [employer] to notify them that patient requires 8 L continuous flow with physical exertion. I have personally contacted hospice Representative Haynes Bast, RN to notify that patient is being referred to hospice, she reports that she will work to get the patient evaluated within the next 24 to 48 hours I have updated patient as well as patient's son in the room today     Return in about 2 months (around 06/07/2020), or if symptoms worsen or fail to improve, for Follow up with Dr. Delton Coombes.   Coral Ceo, NP 04/06/2020   This appointment required 65 minutes of patient care (this includes precharting, chart review, review of results, face-to-face care, etc.).

## 2020-04-06 ENCOUNTER — Encounter: Payer: Self-pay | Admitting: Pulmonary Disease

## 2020-04-06 ENCOUNTER — Other Ambulatory Visit: Payer: Self-pay

## 2020-04-06 ENCOUNTER — Ambulatory Visit (INDEPENDENT_AMBULATORY_CARE_PROVIDER_SITE_OTHER): Payer: Medicare Other | Admitting: Pulmonary Disease

## 2020-04-06 ENCOUNTER — Ambulatory Visit (INDEPENDENT_AMBULATORY_CARE_PROVIDER_SITE_OTHER): Payer: Medicare Other

## 2020-04-06 VITALS — BP 124/78 | HR 96 | Temp 98.3°F | Ht 66.0 in | Wt 199.0 lb

## 2020-04-06 DIAGNOSIS — J9611 Chronic respiratory failure with hypoxia: Secondary | ICD-10-CM | POA: Diagnosis not present

## 2020-04-06 DIAGNOSIS — J438 Other emphysema: Secondary | ICD-10-CM

## 2020-04-06 MED ORDER — ALBUTEROL SULFATE (2.5 MG/3ML) 0.083% IN NEBU: 2.5000 mg | INHALATION_SOLUTION | Freq: Four times a day (QID) | RESPIRATORY_TRACT | 12 refills | Status: DC | PRN

## 2020-04-06 MED ORDER — BREZTRI AEROSPHERE 160-9-4.8 MCG/ACT IN AERO: 2.0000 | INHALATION_SPRAY | Freq: Every day | RESPIRATORY_TRACT | 0 refills | Status: DC

## 2020-04-06 NOTE — Assessment & Plan Note (Addendum)
Patient reporting she is maintained on 8 L continuous at home Patient presenting to office today on 6 L pulsed Patient ambulating to room oxygen saturations were 81% Walk today in office reveals that patient needs 8 L continuous with physical exertion  Plan:  Patient requires 8 L continuous flow with physical exertion We will contact [employer] to see if there is any other suitable options to help the patient especially with making it to office visits and doctors appointments Patient reporting that home concentrator can handle the capacity of 8 L we will confirm this with [employer]  I have personally contacted [employer] to notify them that patient requires 8 L continuous flow with physical exertion. I have personally contacted hospice Representative Haynes Bast, RN to notify that patient is being referred to hospice, she reports that she will work to get the patient evaluated within the next 24 to 48 hours I have updated patient as well as patient's son in the room today

## 2020-04-06 NOTE — Patient Instructions (Addendum)
You were seen today by Coral Ceo, NP  for:   1. Other emphysema (HCC)  - DG Chest 2 View; Future - Ambulatory referral to [facility] >>> 2 puffs in the morning right when you wake up, rinse out your mouth after use, 12 hours later 2 puffs, rinse after use >>> Take this daily, no matter what >>> This is not a rescue inhaler   Note your daily symptoms > remember "red flags" for COPD:   >>>Increase in cough >>>increase in sputum production >>>increase in shortness of breath or activity  intolerance.   If you notice these symptoms, please call the office to be seen.    2. Chronic respiratory failure with hypoxia (HCC)  - DG Chest 2 View; Future - Ambulatory referral to Hospice  Continue oxygen therapy as prescribed  >>>maintain oxygen saturations greater than 88 percent  >>>if unable to maintain oxygen saturations please contact the office  >>>do not smoke with oxygen  >>>can use nasal saline gel or nasal saline rinses to moisturize nose if oxygen causes dryness   We recommend today:  Orders Placed This Encounter  Procedures  . DG Chest 2 View    Standing Status:   Future    Standing Expiration Date:   08/07/2020    Order Specific Question:   Reason for Exam (SYMPTOM  OR DIAGNOSIS REQUIRED)    Answer:   copd    Order Specific Question:   Preferred imaging location?    Answer:   Internal    Order Specific Question:   Radiology Contrast Protocol - do NOT remove file path    Answer:   [email or web address]  . Ambulatory referral to Hospice    Referral Priority:   Urgent    Referral Type:   Consultation    Referral Reason:   Goals of Care    Requested Specialty:   Hospice Services    Number of Visits Requested:   1   Orders Placed This Encounter  Procedures  . DG Chest 2 View  . Ambulatory referral to Hospice   Meds ordered this encounter  Medications  . albuterol (PROVENTIL) (2.5 MG/3ML) 0.083% nebulizer solution     Sig: Take 3 mLs (2.5 mg total) by nebulization every 6 (six) hours as needed for wheezing or shortness of breath. Pt may use every 4 to 6 hours as needed    Dispense:  75 mL    Refill:  12    Follow Up:    Return in about 2 months (around 06/07/2020), or if symptoms worsen or fail to improve, for Follow up with Dr. Delton Coombes.   Please do your part to reduce the spread of COVID-19:      Reduce your risk of any infection  and COVID19 by using the similar precautions used for avoiding the common cold or flu:  Marland Kitchen Wash your hands often with soap and warm water for at least 20 seconds.  If soap and water are not readily available, use an alcohol-based hand sanitizer with at least 60% alcohol.  . If coughing or sneezing, cover your mouth and nose by coughing or sneezing into the elbow areas of your shirt or coat, into a tissue or into your sleeve (not your hands). Drinda Butts A MASK when in public  . Avoid shaking hands with others and consider head nods or verbal greetings only. . Avoid touching your eyes, nose, or mouth with unwashed hands.  . Avoid close contact with people who are sick. Marland Kitchen  Avoid places or events with large numbers of people in one location, like concerts or sporting events. . If you have some symptoms but not all symptoms, continue to monitor at home and seek medical attention if your symptoms worsen. . If you are having a medical emergency, call 911.   ADDITIONAL HEALTHCARE OPTIONS FOR PATIENTS  [place] [facility] / e-Visit: [email or web address]         [facility]: [phone]  [facility]: [phone]                   [facility]: [phone]     It is flu season:   >>> Best ways to protect herself from the flu: Receive the yearly flu vaccine, practice good hand hygiene washing with soap and also using hand sanitizer when available, eat a nutritious meals, get adequate rest, hydrate  appropriately   Please contact the office if your symptoms worsen or you have concerns that you are not improving.   Thank you for choosing [facility] for your healthcare, and for allowing [place] to partner with you on your healthcare journey. I am thankful to be able to provide care to you today.   Elisha Headland FNP-C

## 2020-04-06 NOTE — Assessment & Plan Note (Addendum)
Plan: Continue Breztri  Walk today in office Continue albuterol rescue inhaler as needed We will place order for albuterol nebulized meds for ease of use for patient Maintain oxygen saturations above 88% Referred to hospice today

## 2020-04-06 NOTE — Addendum Note (Signed)
Addended by: Luna Kitchens D on: 04/06/2020 05:21 PM   Modules accepted: Orders

## 2020-04-10 ENCOUNTER — Telehealth: Payer: Self-pay | Admitting: Pulmonary Disease

## 2020-04-10 NOTE — Progress Notes (Signed)
Spoke with pt and notified of results per Dr. Brian Mack. Pt verbalized understanding and denied any questions. 

## 2020-04-10 NOTE — Telephone Encounter (Signed)
Left vm for Langley Adie at [facility] to call me back to let us know exactly what they need.  Order was placed on 7/15 for Palliative Care.  On 7/15 Elwin Mocha documented she was sending order to [facility] and now we have received a message stating we need to place a Palliataive Care order.

## 2020-04-10 NOTE — Telephone Encounter (Signed)
PCC's please advise.  Looks like this was already sent in.

## 2020-04-11 ENCOUNTER — Telehealth: Payer: Self-pay | Admitting: Internal Medicine

## 2020-04-11 ENCOUNTER — Telehealth: Payer: Self-pay | Admitting: Pulmonary Disease

## 2020-04-11 NOTE — Telephone Encounter (Signed)
Received vm from Langley Adie. He went out and assessed pt and Palliative Care order needs to be faxed in.  I called [facility] & spoke to [place].  She does see it is a Palliative Care order and we don't need to do anything else.  She will send to scheduler to have NP go out and see pt.  Nothing further needed.

## 2020-04-11 NOTE — Telephone Encounter (Signed)
Order was placed 7/15 for pt to receive O2 continuous with stationary to be able to use 8L  Called and spoke with pt about last OV and the order that was placed. While speaking with pt, pt had me speak with [employer] rep that was still at her house. I read info from last OV in regards to what she was needing due to pt having POC using 6L and sats were still 80%. After verbalizing with him the info from last OV, he stated that he would get all taken care of for pt. Nothing further needed.

## 2020-04-11 NOTE — Telephone Encounter (Signed)
pt has [employer] at her house now and the pt is stating that she was told that she couldn't use the home fill anymore and needed continous flow - please clarify with pt - [employer] has an order to deliver a concentrator/home fill and she already has it - pt needs clarification - [phone]

## 2020-04-12 NOTE — Telephone Encounter (Signed)
Spoke with patient's daughter, Pricilla Holm, and discussed Palliative services with her and all questions were answered, and she was in agreement with this.  I have scheduled an In-person Consult for 04/21/20 @ 12 Noon.

## 2020-04-21 ENCOUNTER — Other Ambulatory Visit: Payer: Self-pay

## 2020-04-21 ENCOUNTER — Other Ambulatory Visit: Payer: Medicare Other | Admitting: Internal Medicine

## 2020-04-21 DIAGNOSIS — Z515 Encounter for palliative care: Secondary | ICD-10-CM

## 2020-04-21 DIAGNOSIS — Z7189 Other specified counseling: Secondary | ICD-10-CM

## 2020-04-21 NOTE — Progress Notes (Signed)
July 30th, 2021 [facility] Palliative Care Consult Note Telephone: [phone]  Fax: [phone]  PATIENT NAME: Danielle Harmon DOB: 12/30/1940 MRN: [id number]  [address]  [place] [address] [phone] (Home Phone)  PRIMARY CARE PROVIDER:    Bennie Pierini, FNP [address] [place] [address]  REFERRING PROVIDER: Elisha Headland NP (Pulmonary Dz)  RESPONSIBLE PARTY:  Danielle Harmon (daughter lives in [place]/works at [employer]) Danielle, Harmon (Son) [phone] [facility] Phone)   ASSESSMENT / RECOMMENDATIONS:  1. Advance Care Planning: A. Directives: Reviewed DNR; patient states would not want CPR in the event of a cardiopulmonary arrest. I completed 2 forms and left in home. Advised to keep on form on fridge where EMS would look for it should they be summoned. We discussed components of the MOST form. I e-mailed Danielle Harmon some patient education information. B. Goals of Care: Patient was evaluated (04/10/2020) for hospice services, but she declined as has been started on Brextri Aerosphere (budesonide, formoterol, and glycopyrrolate) treatment by her pulmonologist. This medication was not on the hospice formulary. As the patient is doing well with this treatment, she wishes to continue full medical management. The patient was agreeable to being followed by Palliative Care.   2. Cognitive / Functional status, Symptom Management: Patient is alert and oriented. She has a good knowledge base of her illness. She's able to manage her medications. On continuous oxygen at 8 LPM. She maintains sats in the high 90's. No dyspnea when walking in the house if paces herself. Dyspnea with hot environment and if high humidity. We went over nebulizer use. She has an old one available and it's still functional. Family knows needs to obtain new mask/nebulizer medicine chamber and tubing. We also reviewed MDI technique. She has a spacer available though it's a  little dusty. Daughter plans to obtain a new replacement.  She handles quite well changing the regulators on her oxygen tanks. She knows how to access her back up large tank (for use in case of power failure). I reviewed with son and daughter how to change regulator on both large back up tank and travel tanks. Daughter knows how to tap into you tube video for reminder of steps to follow. Showed them where the filter was on the concentrator and how to rinse out q 1-2 weeks and towel/wring dry. Also advised replacing the back up tanks when they are down 3-4 tanks (have 7 currently available). Family has a back up generator available for use should there be a loss of power. We discussed daughter asking PCP for script for transport or regular wheelchair, but first daughter will call [employer] and ask which onto which chair a O2 cage can be attached.  Independent in ADLs. She doesn't shower of bathe; easier to manage wash up from a basin. She prepares some simple meals. Avoids using the stove ("worried I'll forget and leave it on"). She doesn't consistently use her walker; occasional nocturnal use if she goes to the bathroom or if feeling wobbly. We reviewed/I left list of low impact exercises she could do; goal qd-tid. She isn't particularly motivated.   Recent weight 199lbs (loss of 10 lbs over last yr). Good oral intake. At a height or 5'6" her BMI is 32.12 kg/m2.  BP running a bit high on my check; 190's/90's. Michelle plans on bringing in a BP cuff, having pt check and record readings bid, for 1-2 weeks, and report these to PCP for medication adjustment in necessary.  3. Family / Community Supports: Patient lives by herself. Daughter Danielle Harmon and son are very much involved in her care. Danielle Harmon assists with household chores and grocery shopping.  4. Follow up Palliative Care Visit: To complete MOST form. Friday Sept 3 @ noon I spent 90 minutes providing this consultation,  from noon to 1pm. More than 50% of  the time in this consultation was spent coordinating communication.   HISTORY OF PRESENT ILLNESS:  Danielle Harmon is a 79 y.o.  female with COPD (emphysema), allergic rhinitis, GERD, HTN..  Palliative Care was asked to help address goals of care.   CODE STATUS: DNR  PPS: 60%  HOSPICE ELIGIBILITY/DIAGNOSIS: TBD PAST MEDICAL HISTORY:  Past Medical History:  Diagnosis Date  . COPD (chronic obstructive pulmonary disease) (HCC)   . GERD (gastroesophageal reflux disease)   . Hypertension     SOCIAL HX:  Social History   Tobacco Use  . Smoking status: Former Smoker    Packs/day: 1.00    Years: 40.00    Pack years: 40.00    Types: Cigarettes    Quit date: 09/23/2002    Years since quitting: 17.5  . Smokeless tobacco: Never Used  Substance Use Topics  . Alcohol use: Yes    Comment: occ    ALLERGIES:  Allergies  Allergen Reactions  . Bee Venom Anaphylaxis     PERTINENT MEDICATIONS:  Outpatient Encounter Medications as of 04/21/2020  Medication Sig  . albuterol (PROVENTIL) (2.5 MG/3ML) 0.083% nebulizer solution Take 3 mLs (2.5 mg total) by nebulization every 6 (six) hours as needed for wheezing or shortness of breath. Pt may use every 4 to 6 hours as needed  . albuterol (VENTOLIN HFA) 108 (90 Base) MCG/ACT inhaler USE 2 PUFFS EVERY 6 HOURS AS NEEEDED FOR SHORTNESS OF BREATH  . amLODipine (NORVASC) 5 MG tablet Take 1 tablet (5 mg total) by mouth daily.  . Budeson-Glycopyrrol-Formoterol (BREZTRI AEROSPHERE) 160-9-4.8 MCG/ACT AERO Inhale 2 puffs into the lungs 2 (two) times daily.  . Budeson-Glycopyrrol-Formoterol (BREZTRI AEROSPHERE) 160-9-4.8 MCG/ACT AERO Inhale 2 puffs into the lungs daily. >>> 2 puffs in the morning right when you wake up, rinse out your mouth after use, 12 hours later 2 puffs, rinse after use >>> Take this daily, no matter what  . diclofenac Sodium (VOLTAREN) 1 % GEL Apply 4 g topically 4 (four) times daily.  Marland Kitchen guaiFENesin (MUCINEX) 600 MG 12 hr tablet Take 600 mg  by mouth as needed.   . hydrochlorothiazide (HYDRODIURIL) 25 MG tablet Take 1 tablet (25 mg total) by mouth daily.  Marland Kitchen omeprazole (PRILOSEC) 20 MG capsule Take 1 capsule (20 mg total) by mouth daily.  . potassium chloride (K-DUR) 10 MEQ tablet Take 1 tablet (10 mEq total) by mouth 2 (two) times daily.  Marland Kitchen Spacer/Aero Chamber [employer] Use as directed  . triamcinolone (NASACORT) 55 MCG/ACT AERO nasal inhaler Place 2 sprays into the nose daily.   No facility-administered encounter medications on file as of 04/21/2020.    GENERAL EXAM:   BP 190/98, 84 regular.  RR 16, Sat 100% on 8 LPM Pleasantly conversant, well nourished in NAD. Daughter and son in attendance  Gerald Leitz [employer], [place] [address] [phone]

## 2020-04-22 ENCOUNTER — Encounter: Payer: Self-pay | Admitting: Internal Medicine

## 2020-04-29 ENCOUNTER — Other Ambulatory Visit: Payer: Self-pay | Admitting: Emergency Medicine

## 2020-05-23 ENCOUNTER — Other Ambulatory Visit: Payer: Self-pay

## 2020-05-23 ENCOUNTER — Ambulatory Visit: Payer: Medicare Other

## 2020-05-23 DIAGNOSIS — Z0000 Encounter for general adult medical examination without abnormal findings: Secondary | ICD-10-CM

## 2020-05-23 NOTE — Patient Instructions (Signed)
  MEDICARE ANNUAL WELLNESS VISIT Health Maintenance Summary and Written Plan of Care  Danielle Harmon ,  Thank you for allowing me to perform your Medicare Annual Wellness Visit and for your ongoing commitment to your health.   Health Maintenance & Immunization History Health Maintenance  Topic Date Due  . Hepatitis C Screening  Never done  . DEXA SCAN  Never done  . INFLUENZA VACCINE  04/23/2020  . TETANUS/TDAP  09/07/2028  . COVID-19 Vaccine  Completed  . PNA vac Low Risk Adult  Completed   Immunization History  Administered Date(s) Administered  . Influenza Inj Mdck Quad With Preservative 06/29/2019  . Influenza Whole 07/05/2014  . Influenza, High Dose Seasonal PF 07/03/2015, 06/26/2016, 06/02/2017, 07/07/2018  . Influenza, Seasonal, Injecte, Preservative Fre 06/26/2015  . Influenza,inj,Quad PF,6+ Mos 06/23/2013, 06/26/2015  . Influenza-Unspecified 07/20/2014  . Moderna SARS-COVID-2 Vaccination 10/28/2019, 11/26/2019  . Pneumococcal Conjugate-13 03/30/2015, 10/10/2015  . Pneumococcal Polysaccharide-23 09/24/2011, 06/23/2012  . Tdap 09/07/2018    These are the patient goals that we discussed: Goals Addressed   None      This is a list of Health Maintenance Items that are overdue or due now: Health Maintenance Due  Topic Date Due  . Hepatitis C Screening  Never done  . DEXA SCAN  Never done  . INFLUENZA VACCINE  04/23/2020     Orders/Referrals Placed Today: No orders of the defined types were placed in this encounter.  (Contact our referral department at [phone] if you have not spoken with someone about your referral appointment within the next 5 days)    Follow-up Plan  Scheduled with Gennette Pac 07/14/20 at 10am.

## 2020-05-23 NOTE — Progress Notes (Signed)
MEDICARE ANNUAL WELLNESS VISIT  05/23/2020  Telephone Visit Disclaimer This Medicare AWV was conducted by telephone due to national recommendations for restrictions regarding the COVID-19 Pandemic (e.g. social distancing).  I verified, using two identifiers, that I am speaking with Danielle DanaBrenda Sullivant or their authorized healthcare agent. I discussed the limitations, risks, security, and privacy concerns of performing an evaluation and management service by telephone and the potential availability of an in-person appointment in the future. The patient expressed understanding and agreed to proceed.   Subjective:  Danielle Harmon is a 79 y.o. female patient of Bennie PieriniMartin, Mary-Margaret, FNP who had a Medicare Annual Wellness Visit today via telephone. Danielle Harmon is retired and lives alone. she has three children. she reports that she is not socially active and does interact with friends/family regularly. she is not physically active and enjoys crossword puzzles.  Patient Care Team: Bennie PieriniMartin, Mary-Margaret, FNP as PCP - General (Family Medicine) Delton CoombesByrum, Les Pouobert S, MD as Consulting Physician (Pulmonary Disease) Anselm LisSerpe, Mary P, NP as Nurse Practitioner [facility](Hospice and Palliative Medicine)  Advanced Directives 05/23/2020 05/23/2020 05/19/2019  Does Patient Have a Medical Advance Directive? Yes No No;Yes  Type of Advance Directive Out of facility DNR (pink MOST or yellow form) - MidwifeHealthcare Power of Attorney;Living will  Does patient want to make changes to medical advance directive? No - Patient declined - No - Patient declined  Copy of Healthcare Power of Attorney in Chart? - - No - copy requested  Would patient like information on creating a medical advance directive? - No - Guardian declined Yes (MAU/Ambulatory/Procedural Areas - Information given);No - Patient declined    Hospital Utilization Over the Past 12 Months: # of hospitalizations or ER visits: 1 # of surgeries: 0  Review of Systems    Patient reports  that her overall health is worse compared to last year.  Negative except Patient had to increase to 8L of O2. Has become less mobile since.  Patient Reported Readings (BP, Pulse, CBG, Weight, etc) none  Pain Assessment Pain : No/denies pain     Current Medications & Allergies (verified) Allergies as of 05/23/2020      Reactions   Bee Venom Anaphylaxis      Medication List       Accurate as of May 23, 2020 10:52 AM. If you have any questions, ask your nurse or doctor.        albuterol (2.5 MG/3ML) 0.083% nebulizer solution Commonly known as: PROVENTIL Take 3 mLs (2.5 mg total) by nebulization every 6 (six) hours as needed for wheezing or shortness of breath. Pt may use every 4 to 6 hours as needed   albuterol 108 (90 Base) MCG/ACT inhaler Commonly known as: VENTOLIN HFA TAKE 2 PUFFS BY MOUTH EVERY 6 HOURS AS NEEDED   amLODipine 5 MG tablet Commonly known as: NORVASC Take 1 tablet (5 mg total) by mouth daily.   Breztri Aerosphere 160-9-4.8 MCG/ACT Aero Generic drug: Budeson-Glycopyrrol-Formoterol Inhale 2 puffs into the lungs 2 (two) times daily.   Breztri Aerosphere 160-9-4.8 MCG/ACT Aero Generic drug: Budeson-Glycopyrrol-Formoterol Inhale 2 puffs into the lungs daily. >>> 2 puffs in the morning right when you wake up, rinse out your mouth after use, 12 hours later 2 puffs, rinse after use >>> Take this daily, no matter what   diclofenac Sodium 1 % Gel Commonly known as: Voltaren Apply 4 g topically 4 (four) times daily.   guaiFENesin 600 MG 12 hr tablet Commonly known as: MUCINEX Take 600 mg by mouth  as needed.   hydrochlorothiazide 25 MG tablet Commonly known as: HYDRODIURIL Take 1 tablet (25 mg total) by mouth daily.   omeprazole 20 MG capsule Commonly known as: PRILOSEC Take 1 capsule (20 mg total) by mouth daily.   potassium chloride 10 MEQ tablet Commonly known as: KLOR-CON Take 1 tablet (10 mEq total) by mouth 2 (two) times daily.     Spacer/Aero Chamber [employer] Use as directed   triamcinolone 55 MCG/ACT Aero nasal inhaler Commonly known as: NASACORT Place 2 sprays into the nose daily.       History (reviewed): Past Medical History:  Diagnosis Date  . COPD (chronic obstructive pulmonary disease) (HCC)   . GERD (gastroesophageal reflux disease)   . Hypertension    Past Surgical History:  Procedure Laterality Date  . CHOLECYSTECTOMY    . CYSTECTOMY     benign cysts removed from head and R hand  . KNEE ARTHROSCOPY     Left   Family History  Problem Relation Age of Onset  . Emphysema Father   . COPD Father   . Stroke Mother   . Diabetes Son    Social History   Socioeconomic History  . Marital status: Widowed    Spouse name: Shondell Fabel  . Number of children: 3  . Years of education: 16  . Highest education level: Bachelor's degree (e.g., BA, AB, BS)  Occupational History  . Occupation: Engineer, site    Comment: retired  Tobacco Use  . Smoking status: Former Smoker    Packs/day: 1.00    Years: 40.00    Pack years: 40.00    Types: Cigarettes    Quit date: 09/23/2002    Years since quitting: 17.6  . Smokeless tobacco: Never Used  Vaping Use  . Vaping Use: Never used  Substance and Sexual Activity  . Alcohol use: Yes    Comment: occ  . Drug use: Never  . Sexual activity: Not on file  Other Topics Concern  . Not on file  Social History Narrative  . Not on file   Social Determinants of Health   Financial Resource Strain:   . Difficulty of Paying Living Expenses: Not on file  Food Insecurity:   . Worried About Programme researcher, broadcasting/film/video in the Last Year: Not on file  . Ran Out of Food in the Last Year: Not on file  Transportation Needs:   . Lack of Transportation (Medical): Not on file  . Lack of Transportation (Non-Medical): Not on file  Physical Activity:   . Days of Exercise per Week: Not on file  . Minutes of Exercise per Session: Not on file  Stress:   . Feeling of  Stress : Not on file  Social Connections:   . Frequency of Communication with Friends and Family: Not on file  . Frequency of Social Gatherings with Friends and Family: Not on file  . Attends Religious Services: Not on file  . Active Member of Clubs or Organizations: Not on file  . Attends Banker Meetings: Not on file  . Marital Status: Not on file    Activities of Daily Living In your present state of health, do you have any difficulty performing the following activities: 05/23/2020  Hearing? N  Vision? N  Difficulty concentrating or making decisions? N  Walking or climbing stairs? Y  Dressing or bathing? N  Doing errands, shopping? Y  Preparing Food and eating ? N  Using the Toilet? N  In the  past six months, have you accidently leaked urine? N  Do you have problems with loss of bowel control? N  Managing your Medications? N  Managing your Finances? N  Housekeeping or managing your Housekeeping? N  Some recent data might be hidden    Patient Education/ Literacy How often do you need to have someone help you when you read instructions, pamphlets, or other written materials from your doctor or pharmacy?: 3 - Sometimes What is the last grade level you completed in school?: College  Exercise Current Exercise Habits: The patient does not participate in regular exercise at present, Exercise limited by: respiratory conditions(s)  Diet Patient reports consuming 3 meals a day and 2 snack(s) a day Patient reports that her primary diet is: Regular Patient reports that she does have regular access to food.   Depression Screen PHQ 2/9 Scores 05/23/2020 01/03/2020 05/19/2019 05/05/2019  PHQ - 2 Score 0 0 0 0     Fall Risk Fall Risk  05/23/2020 01/03/2020 05/19/2019 05/05/2019  Falls in the past year? 0 1 0 1  Number falls in past yr: - 0 - 1  Injury with Fall? - 0 - 1  Risk for fall due to : - - (No Data) Impaired balance/gait;History of fall(s)  Risk for fall due to:  Comment - - On oxygen with tubing.   Has a cat in the home. -     Objective:  Danielle Harmon seemed alert and oriented and she participated appropriately during our telephone visit.  Blood Pressure Weight BMI  BP Readings from Last 3 Encounters:  04/06/20 124/78  03/02/20 (!) 142/72  01/03/20 136/80   Wt Readings from Last 3 Encounters:  04/06/20 199 lb (90.3 kg)  03/02/20 201 lb (91.2 kg)  01/03/20 201 lb (91.2 kg)   BMI Readings from Last 1 Encounters:  04/06/20 32.12 kg/m    *Unable to obtain current vital signs, weight, and BMI due to telephone visit type  Hearing/Vision  . Milanya did not seem to have difficulty with hearing/understanding during the telephone conversation . Reports that she has not had a formal eye exam by an eye care professional within the past year . Reports that she has not had a formal hearing evaluation within the past year *Unable to fully assess hearing and vision during telephone visit type  Cognitive Function: 6CIT Screen 05/23/2020 05/19/2019  What Year? 0 points 0 points  What month? 0 points 0 points  What time? 0 points 0 points  Count back from 20 0 points 0 points  Months in reverse 0 points 0 points  Repeat phrase 0 points 0 points  Total Score 0 0   (Normal:0-7, Significant for Dysfunction: >8)  Normal Cognitive Function Screening: Yes   Immunization & Health Maintenance Record Immunization History  Administered Date(s) Administered  . Influenza Inj Mdck Quad With Preservative 06/29/2019  . Influenza Whole 07/05/2014  . Influenza, High Dose Seasonal PF 07/03/2015, 06/26/2016, 06/02/2017, 07/07/2018  . Influenza, Seasonal, Injecte, Preservative Fre 06/26/2015  . Influenza,inj,Quad PF,6+ Mos 06/23/2013, 06/26/2015  . Influenza-Unspecified 07/20/2014  . Moderna SARS-COVID-2 Vaccination 10/28/2019, 11/26/2019  . Pneumococcal Conjugate-13 03/30/2015, 10/10/2015  . Pneumococcal Polysaccharide-23 09/24/2011, 06/23/2012  . Tdap  09/07/2018    Health Maintenance  Topic Date Due  . Hepatitis C Screening  Never done  . DEXA SCAN  Never done  . INFLUENZA VACCINE  04/23/2020  . TETANUS/TDAP  09/07/2028  . COVID-19 Vaccine  Completed  . PNA vac Low Risk Adult  Completed  Assessment  This is a routine wellness examination for Danielle Harmon.  Health Maintenance: Due or Overdue Health Maintenance Due  Topic Date Due  . Hepatitis C Screening  Never done  . DEXA SCAN  Never done  . INFLUENZA VACCINE  04/23/2020    Danielle Harmon does not need a referral for Community Assistance: Care Management:   no Social Work:    no Prescription Assistance:  no Nutrition/Diabetes Education:  no   Plan:  Personalized Goals Goals Addressed   None    Personalized Health Maintenance & Screening Recommendations    Lung Cancer Screening Recommended: no (Low Dose CT Chest recommended if Age 25-80 years, 30 pack-year currently smoking OR have quit w/in past 15 years) Hepatitis C Screening recommended: no HIV Screening recommended: no  Advanced Directives: Written information was not prepared per patient's request.  Referrals & Orders No orders of the defined types were placed in this encounter.   Follow-up Plan . Follow-up with Bennie Pierini, FNP as planned . Schedule 10/22/221   I have personally reviewed and noted the following in the patient's chart:   . Medical and social history . Use of alcohol, tobacco or illicit drugs  . Current medications and supplements . Functional ability and status . Nutritional status . Physical activity . Advanced directives . List of other physicians . Hospitalizations, surgeries, and ER visits in previous 12 months . Vitals . Screenings to include cognitive, depression, and falls . Referrals and appointments  In addition, I have reviewed and discussed with Danielle Harmon certain preventive protocols, quality metrics, and best practice recommendations. A written  personalized care plan for preventive services as well as general preventive health recommendations is available and can be mailed to the patient at her request.      Suzan Slick [facility]  [phone]

## 2020-05-24 ENCOUNTER — Telehealth: Payer: Self-pay

## 2020-05-24 NOTE — Telephone Encounter (Signed)
Received email from patient's daughter requesting visit scheduled for 05/26/20 to be rescheduled on 06/30/20.Replied to email with rescheduled visit time.

## 2020-06-08 ENCOUNTER — Ambulatory Visit: Payer: Medicare Other | Admitting: Emergency Medicine

## 2020-06-15 ENCOUNTER — Other Ambulatory Visit: Payer: Self-pay

## 2020-06-15 ENCOUNTER — Encounter: Payer: Self-pay | Admitting: Pulmonary Disease

## 2020-06-15 ENCOUNTER — Ambulatory Visit (INDEPENDENT_AMBULATORY_CARE_PROVIDER_SITE_OTHER): Payer: Medicare Other | Admitting: Pulmonary Disease

## 2020-06-15 VITALS — BP 120/70 | HR 66 | Temp 97.1°F | Ht 64.0 in | Wt 201.4 lb

## 2020-06-15 DIAGNOSIS — Z0000 Encounter for general adult medical examination without abnormal findings: Secondary | ICD-10-CM | POA: Insufficient documentation

## 2020-06-15 DIAGNOSIS — J9611 Chronic respiratory failure with hypoxia: Secondary | ICD-10-CM | POA: Diagnosis not present

## 2020-06-15 DIAGNOSIS — R0602 Shortness of breath: Secondary | ICD-10-CM | POA: Diagnosis not present

## 2020-06-15 DIAGNOSIS — Z23 Encounter for immunization: Secondary | ICD-10-CM

## 2020-06-15 DIAGNOSIS — J438 Other emphysema: Secondary | ICD-10-CM | POA: Diagnosis not present

## 2020-06-15 LAB — COMPREHENSIVE METABOLIC PANEL
ALT: 16 U/L (ref 0–35)
AST: 18 U/L (ref 0–37)
Albumin: 3.9 g/dL (ref 3.5–5.2)
Alkaline Phosphatase: 72 U/L (ref 39–117)
BUN: 13 mg/dL (ref 6–23)
CO2: 38 mEq/L — ABNORMAL HIGH (ref 19–32)
Calcium: 9.6 mg/dL (ref 8.4–10.5)
Chloride: 93 mEq/L — ABNORMAL LOW (ref 96–112)
Creatinine, Ser: 0.76 mg/dL (ref 0.40–1.20)
GFR: 73.33 mL/min (ref >60.00)
Glucose, Bld: 118 mg/dL — ABNORMAL HIGH (ref 70–99)
Potassium: 3.7 mEq/L (ref 3.5–5.1)
Sodium: 136 mEq/L (ref 135–145)
Total Bilirubin: 0.5 mg/dL (ref 0.2–1.2)
Total Protein: 7.1 g/dL (ref 6.0–8.3)

## 2020-06-15 NOTE — Patient Instructions (Addendum)
You were seen today by Lauraine Rinne, NP  for:   1. Shortness of breath /lower extremity swelling  - Comp Met (CMET); Future - Pro b natriuretic peptide (BNP); Future  We will check lab work to see if you are retaining fluid  2. Other emphysema ([place])  Breztri >>> 2 puffs in the morning right when you wake up, rinse out your mouth after use, 12 hours later 2 puffs, rinse after use >>> Take this daily, no matter what >>> This is not a rescue inhaler   Note your daily symptoms > remember "red flags" for COPD:   >>>Increase in cough >>>increase in sputum production >>>increase in shortness of breath or activity  intolerance.   If you notice these symptoms, please call the office to be seen.   Remain established with palliative care  3. Chronic respiratory failure with hypoxia (HCC)  We have turned her oxygen down to 4 L at rest, we will recheck this before you leave  Continue oxygen therapy as prescribed  >>>maintain oxygen saturations greater than 88 percent  >>>if unable to maintain oxygen saturations please contact the office  >>>do not smoke with oxygen  >>>can use nasal saline gel or nasal saline rinses to moisturize nose if oxygen causes dryness   4. Healthcare maintenance  High-dose flu vaccine today   We recommend today:  Orders Placed This Encounter  Procedures   Comp Met (CMET)    Standing Status:   Future    Standing Expiration Date:   06/15/2021   Pro b natriuretic peptide (BNP)    Standing Status:   Future    Standing Expiration Date:   06/15/2021   Orders Placed This Encounter  Procedures   Comp Met (CMET)   Pro b natriuretic peptide (BNP)   No orders of the defined types were placed in this encounter.   Follow Up:    Return in about 6 weeks (around 07/27/2020), or if symptoms worsen or fail to improve, for Follow up with Dr. Lamonte Sakai.   Notification of test results are managed in the following manner: If there are  any recommendations or changes  to the  plan of care discussed in office today,  we will contact you and let you know what they are. If you do not hear from [place], then your results are normal and you can view them through your  MyChart account , or a letter will be sent to you. Thank you again for trusting [place] with your care  - Thank you, [facility]    It is flu season:   >>> Best ways to protect herself from the flu: Receive the yearly flu vaccine, practice good hand hygiene washing with soap and also using hand sanitizer when available, eat a nutritious meals, get adequate rest, hydrate appropriately       Please contact the office if your symptoms worsen or you have concerns that you are not improving.   Thank you for choosing [facility] for your healthcare, and for allowing [place] to partner with you on your healthcare journey. I am thankful to be able to provide care to you today.   Wyn Quaker FNP-C

## 2020-06-15 NOTE — Assessment & Plan Note (Signed)
Plan:  Patient requires 8 L continuous flow with physical exertion 6 L continuous flow at rest

## 2020-06-15 NOTE — Assessment & Plan Note (Signed)
Plan: Continue Breztri  Continue albuterol rescue inhaler as needed We will place order for albuterol nebulized meds for ease of use for patient Maintain oxygen saturations above 88% Remain established with palliative care

## 2020-06-15 NOTE — Assessment & Plan Note (Signed)
Lower extremity edema 2+ next Plan: We will order lab work today to further evaluate

## 2020-06-15 NOTE — Assessment & Plan Note (Signed)
Plan: °Flu vaccine today °

## 2020-06-15 NOTE — Progress Notes (Signed)
@Patient  ID: , female    DOB: 1940-09-29, 79 y.o.   MRN: [id number]  Chief Complaint  Patient presents with  . Follow-up    COPD    Referring provider: [id number], *  HPI:  79 year old female former smoker followed in our office for COPD and chronic respiratory failure  PMH: Hypertension, GERD Smoker/ Smoking History: Former smoker.  Quit 2004.  40-pack-year smoking history Maintenance: Breztri  Pt of: Dr. 2005  06/15/2020  - Visit   79 year old female former smoker followed in our office for COPD.  Patient is completing follow-up with our office after July/2021 office visit.  Recommendations from that office visit was as follows: Require 8 L continuous flow with physical exertion.  [employer] [facility] was contacted regarding patient's oxygen levels.  Patient was referred to hospice therapy.  Patient declined establishing with hospice because she wanted to remain on her inhalers.  She is established with palliative care.  She is currently a DNR.  Her next follow-up with palliative care is on 06/30/2020 per family present today.  Patient remains on 6 L continuous at rest and 8 L continuous with physical exertion.   Questionaires / Pulmonary Flowsheets:   ACT:  No flowsheet data found.  MMRC: mMRC Dyspnea Scale mMRC Score  06/15/2020 3  04/06/2020 3    Epworth:  No flowsheet data found.  Tests:   03/01/2011-pulmonary function test-FVC 2.21 (68% predicted), postbronchodilator ratio 41, postbronchodilator FEV1 0.83 (33% predicted), no bronchodilator response, DLCO 9.28 (34% predicted)  02/21/2011-chest x-ray-COPD, no active cardiopulmonary disease   FENO:  No results found for: NITRICOXIDE  PFT: No flowsheet data found.  WALK:  SIX MIN WALK 04/06/2020 03/02/2020 07/17/2017  Supplimental Oxygen during Test? (L/min) - Yes Yes  O2 Flow Rate - 6 3  Type - Continuous Pulse  Tech Comments: pt was walked with the assitance of walker pt had to be put on 6 liters  of pulse oxygen pt stats dropped to 85%about 125 feet in walk had to be put on 8 liters of continous oxygen to maintain stats of 95 % Patient was only able to walk short distance before she had to turn around and return to room. pt walked one lap and a half with several breaks at slow pace with assistance walking.    Imaging: No results found.  Lab Results:  CBC    Component Value Date/Time   WBC 8.1 01/03/2020 1105   RBC 4.53 01/03/2020 1105   HGB 13.6 01/03/2020 1105   HCT 40.3 01/03/2020 1105   PLT 255 01/03/2020 1105   MCV 89 01/03/2020 1105   MCH 30.0 01/03/2020 1105   MCHC 33.7 01/03/2020 1105   RDW 12.4 01/03/2020 1105   LYMPHSABS 1.6 01/03/2020 1105   EOSABS 0.1 01/03/2020 1105   BASOSABS 0.1 01/03/2020 1105    BMET    Component Value Date/Time   NA 136 06/15/2020 1106   NA 137 01/03/2020 1105   K 3.7 06/15/2020 1106   CL 93 (L) 06/15/2020 1106   CO2 38 (H) 06/15/2020 1106   GLUCOSE 118 (H) 06/15/2020 1106   BUN 13 06/15/2020 1106   BUN 10 01/03/2020 1105   CREATININE 0.76 06/15/2020 1106   CALCIUM 9.6 06/15/2020 1106   GFRNONAA 77 01/03/2020 1105   GFRAA 88 01/03/2020 1105    BNP No results found for: BNP  ProBNP No results found for: PROBNP  Specialty Problems      Pulmonary Problems   COPD (chronic obstructive pulmonary  disease) with emphysema (HCC)    CXR 2012: hyperinflation PFT's 2012: FEV1 0.85 (34%), ratio 38, TLC normal, DLCO 34% Ambulatory Ox 02/2011:  desat to 86% at very end of walking.  Started on oxygen 2013 by ER.       Allergic rhinitis   Chronic respiratory failure with hypoxia (HCC)   Shortness of breath      Allergies  Allergen Reactions  . Bee Venom Anaphylaxis    Immunization History  Administered Date(s) Administered  . Influenza Inj Mdck Quad With Preservative 06/29/2019  . Influenza Whole 07/05/2014  . Influenza, High Dose Seasonal PF 07/03/2015, 06/26/2016, 06/02/2017, 07/07/2018, 06/15/2020  . Influenza,  Seasonal, Injecte, Preservative Fre 06/26/2015  . Influenza,inj,Quad PF,6+ Mos 06/23/2013, 06/26/2015  . Influenza-Unspecified 07/20/2014  . Moderna SARS-COVID-2 Vaccination 10/28/2019, 11/26/2019  . Pneumococcal Conjugate-13 03/30/2015, 10/10/2015  . Pneumococcal Polysaccharide-23 09/24/2011, 06/23/2012  . Tdap 09/07/2018    Past Medical History:  Diagnosis Date  . COPD (chronic obstructive pulmonary disease) (HCC)   . GERD (gastroesophageal reflux disease)   . Hypertension     Tobacco History: Social History   Tobacco Use  Smoking Status Former Smoker  . Packs/day: 1.00  . Years: 40.00  . Pack years: 40.00  . Types: Cigarettes  . Quit date: 09/23/2002  . Years since quitting: 17.7  Smokeless Tobacco Never Used   Counseling given: Yes   Continue to not smoke  Outpatient Encounter Medications as of 06/15/2020  Medication Sig  . albuterol (PROVENTIL) (2.5 MG/3ML) 0.083% nebulizer solution Take 3 mLs (2.5 mg total) by nebulization every 6 (six) hours as needed for wheezing or shortness of breath. Pt may use every 4 to 6 hours as needed  . albuterol (VENTOLIN HFA) 108 (90 Base) MCG/ACT inhaler TAKE 2 PUFFS BY MOUTH EVERY 6 HOURS AS NEEDED  . amLODipine (NORVASC) 5 MG tablet Take 1 tablet (5 mg total) by mouth daily.  . Budeson-Glycopyrrol-Formoterol (BREZTRI AEROSPHERE) 160-9-4.8 MCG/ACT AERO Inhale 2 puffs into the lungs 2 (two) times daily.  . Budeson-Glycopyrrol-Formoterol (BREZTRI AEROSPHERE) 160-9-4.8 MCG/ACT AERO Inhale 2 puffs into the lungs daily. >>> 2 puffs in the morning right when you wake up, rinse out your mouth after use, 12 hours later 2 puffs, rinse after use >>> Take this daily, no matter what  . diclofenac Sodium (VOLTAREN) 1 % GEL Apply 4 g topically 4 (four) times daily.  Marland Kitchen guaiFENesin (MUCINEX) 600 MG 12 hr tablet Take 600 mg by mouth as needed.   . hydrochlorothiazide (HYDRODIURIL) 25 MG tablet Take 1 tablet (25 mg total) by mouth daily.  Marland Kitchen omeprazole  (PRILOSEC) 20 MG capsule Take 1 capsule (20 mg total) by mouth daily.  . potassium chloride (K-DUR) 10 MEQ tablet Take 1 tablet (10 mEq total) by mouth 2 (two) times daily.  Marland Kitchen Spacer/Aero Chamber [employer] Use as directed  . triamcinolone (NASACORT) 55 MCG/ACT AERO nasal inhaler Place 2 sprays into the nose daily.   No facility-administered encounter medications on file as of 06/15/2020.     Review of Systems  Review of Systems  Constitutional: Positive for fatigue. Negative for activity change and fever.  HENT: Negative for sinus pressure, sinus pain and sore throat.   Respiratory: Positive for shortness of breath. Negative for cough and wheezing.   Cardiovascular: Negative for chest pain and palpitations.  Musculoskeletal: Negative for arthralgias.  Neurological: Negative for dizziness.  Psychiatric/Behavioral: Negative for sleep disturbance. The patient is not nervous/anxious.      Physical Exam  BP 120/70 (  BP Location: Left Arm, Cuff Size: Normal)   Pulse 66   Temp (!) 97.1 F (36.2 C) (Oral)   Ht 5\' 4"  (1.626 m)   Wt 201 lb 6.4 oz (91.4 kg)   SpO2 99%   BMI 34.57 kg/m   Wt Readings from Last 5 Encounters:  06/15/20 201 lb 6.4 oz (91.4 kg)  04/06/20 199 lb (90.3 kg)  03/02/20 201 lb (91.2 kg)  01/03/20 201 lb (91.2 kg)  05/05/19 203 lb (92.1 kg)    BMI Readings from Last 5 Encounters:  06/15/20 34.57 kg/m  04/06/20 32.12 kg/m  03/02/20 34.50 kg/m  01/03/20 32.44 kg/m  05/05/19 32.77 kg/m     Physical Exam Vitals and nursing note reviewed.  Constitutional:      General: She is not in acute distress.    Appearance: Normal appearance. She is obese.     Comments: Frail elderly female  HENT:     Head: Normocephalic and atraumatic.     Right Ear: External ear normal.     Left Ear: External ear normal.     Nose: Nose normal. No congestion or rhinorrhea.     Mouth/Throat:     Mouth: Mucous membranes are moist.     Pharynx: Oropharynx is clear.   Eyes:     Pupils: Pupils are equal, round, and reactive to light.  Cardiovascular:     Rate and Rhythm: Normal rate and regular rhythm.     Pulses: Normal pulses.     Heart sounds: Normal heart sounds. No murmur heard.   Pulmonary:     Effort: Pulmonary effort is normal. No respiratory distress.     Breath sounds: No decreased air movement. No decreased breath sounds, wheezing or rales.     Comments: Diminished breath sounds throughout exam Musculoskeletal:     Cervical back: Normal range of motion.     Right lower leg: 3+ Edema present.     Left lower leg: 3+ Edema present.  Skin:    General: Skin is warm and dry.     Capillary Refill: Capillary refill takes less than 2 seconds.  Neurological:     General: No focal deficit present.     Mental Status: She is alert and oriented to person, place, and time. Mental status is at baseline.     Motor: Weakness present.     Gait: Gait abnormal (In wheelchair).  Psychiatric:        Mood and Affect: Mood normal.        Behavior: Behavior normal.        Thought Content: Thought content normal.        Judgment: Judgment normal.       Assessment & Plan:   COPD (chronic obstructive pulmonary disease) with emphysema Plan: Continue Breztri  Continue albuterol rescue inhaler as needed We will place order for albuterol nebulized meds for ease of use for patient Maintain oxygen saturations above 88% Remain established with palliative care  Chronic respiratory failure with hypoxia (HCC) Plan:  Patient requires 8 L continuous flow with physical exertion 6 L continuous flow at rest   Shortness of breath Lower extremity edema 2+ next Plan: We will order lab work today to further evaluate  Healthcare maintenance Plan: Flu vaccine today    Return in about 6 weeks (around 07/27/2020), or if symptoms worsen or fail to improve, for Follow up with Dr. 13/12/2019.   Delton Coombes, NP 06/15/2020   This appointment required 32 minutes of  patient care (this includes precharting, chart review, review of results, face-to-face care, etc.).

## 2020-06-16 LAB — PRO B NATRIURETIC PEPTIDE: NT-Pro BNP: 144 pg/mL (ref 0–738)

## 2020-06-27 ENCOUNTER — Other Ambulatory Visit: Payer: Self-pay | Admitting: Emergency Medicine

## 2020-06-30 ENCOUNTER — Other Ambulatory Visit: Payer: Self-pay

## 2020-06-30 ENCOUNTER — Other Ambulatory Visit: Payer: Self-pay | Admitting: *Deleted

## 2020-06-30 ENCOUNTER — Encounter: Payer: Self-pay | Admitting: Nurse Practitioner

## 2020-06-30 ENCOUNTER — Other Ambulatory Visit: Payer: Medicare Other | Admitting: Nurse Practitioner

## 2020-06-30 DIAGNOSIS — J449 Chronic obstructive pulmonary disease, unspecified: Secondary | ICD-10-CM

## 2020-06-30 DIAGNOSIS — Z515 Encounter for palliative care: Secondary | ICD-10-CM

## 2020-06-30 MED ORDER — OMEPRAZOLE 20 MG PO CPDR: 20.0000 mg | DELAYED_RELEASE_CAPSULE | Freq: Every day | ORAL | 0 refills | Status: DC

## 2020-06-30 NOTE — Progress Notes (Signed)
[facility] Consult Note Telephone: [phone]  Fax: [phone]  PATIENT NAME: Danielle Harmon [place] [address] [phone] (home)  DOB: 10/10/1940 MRN: [id number]  PRIMARY CARE PROVIDER:    Chevis Pretty, [place],  [place] [address] [phone]  REFERRING PROVIDER:   Wyn Quaker NP (Pulmonary Dz)  RESPONSIBLE PARTY:  Lionel December (daughter lives in [place]/works at [employer] [facility]) Dwanda, [place] (Son) [phone] [facility] Phone)   I met face to face with patient and daughter in home.  ASSESSMENT AND RECOMMENDATIONS:    1. Advance Care Planning/Goals of Care: Goal of care: Patient's goal of care is comfort and be safe in her home. Patient has a med alert neck lace, which she said she has not had the cause to use. Directives: Patient has a DNR in home. She reiterated desire to not be be resuscitated in the event of cardiac or respiratory arrest. MOST form discussed and sections reviewed with patient and daughter. MOST form completed and given to patient, details includes limited additional intervention, antibiotics if indicated, IV fluids for a defined trial period, feeding tube for defined trial period. Copy of MOST form uploaded to [facility] EMR.  2. Symptom Management: Patient with end stage COPD oxygen dependent. Patient declined establishing with Hospice care services in the past because she wanted to remain on her inhalers. She is on Breztri which is not on Hospice formulary. Patient report stability in her breathing, she was started on Albuterol nebulizer for ease of use at last pulmonology appointment. Patient report using it about once a day. Per Pulmonology, goal is to maintain oxygen saturation greater than 88%. Patient saturation today is 99% on 9L. Discussed and reviewed risk for oxygen toxicity with too much oxygen administration, patient and daughter verbalized understanding.  Oxygen flow turned down to 6L, encouraged to leave it at 6L if saturation is greater than 88%. Patient denied fever, chills, acute cough, SOB or increased sputum production. Patient noted with mild wheeze, advised to use per PRN Albuterol Neb. Patient received Flu vaccine at last Pulmonology appointment on 06/15/2020. Discussed need to avoid irritants, and avoid strenuous activity and pacing herself during activities. Patient daughter requested for a wheelchair that has a stand for oxygen tank, reporting that patient's oxygen tank is cumbersome to carry when patient is going out, advised daughter to reach out to patient's PCP for a wheelchair prescription and may have to let the DME company know what she wants.  3. Follow up Palliative Care Visit: Palliative care will continue to follow for goals of care clarification and symptom management. Return in about 4 weeks or prn.  4. Family /Caregiver/Community Supports: Patient lives in her home independently, she has three children who are very involved in her care.   5. Cognitive / Functional decline: Patient alert and oriented, enjoys doing crossword puzzles, has a good knowledge base of her illness. Patient able to cook light meals. Ambulates within her home independently, uses walker or wheelchair when going outside her house. No report of falls. She is continent of bowel and bladder.   I spent 50 minutes providing this consultation, time includes time spent with patient/family, chart review, and documentation. More than 50% of the time in this consultation was spent coordinating communication.   HISTORY OF PRESENT ILLNESS:  Trissa Molina is a 79 y.o. year old female with multiple medical problems including COPD (emphysema), allergic rhinitis, GERD, HTN. Palliative Care was asked to follow  this patient by consultation request of Wyn Quaker, NP to help address advance care planning and goals of care. Palliative Care was asked to help address goals of care.  This is a follow up visit from 04/21/2020.  CODE STATUS: DNR  PPS: 60%  HOSPICE ELIGIBILITY/DIAGNOSIS: TBD. Patient declined establishing with Hospice care in the past because she wanted to remain on her inhalers.  PAST MEDICAL HISTORY:  Past Medical History:  Diagnosis Date   COPD (chronic obstructive pulmonary disease) (HCC)    GERD (gastroesophageal reflux disease)    Hypertension     SOCIAL HX:  Social History   Tobacco Use   Smoking status: Former Smoker    Packs/day: 1.00    Years: 40.00    Pack years: 40.00    Types: Cigarettes    Quit date: 09/23/2002    Years since quitting: 17.7   Smokeless tobacco: Never Used  Substance Use Topics   Alcohol use: Yes    Comment: occ   FAMILY HX:  Family History  Problem Relation Age of Onset   Emphysema Father    COPD Father    Stroke Mother    Diabetes Son     ALLERGIES:  Allergies  Allergen Reactions   Bee Venom Anaphylaxis     PERTINENT MEDICATIONS:  Outpatient Encounter Medications as of 06/30/2020  Medication Sig   albuterol (PROVENTIL) (2.5 MG/3ML) 0.083% nebulizer solution Take 3 mLs (2.5 mg total) by nebulization every 6 (six) hours as needed for wheezing or shortness of breath. Pt may use every 4 to 6 hours as needed   albuterol (VENTOLIN HFA) 108 (90 Base) MCG/ACT inhaler TAKE 2 PUFFS BY MOUTH EVERY 6 HOURS AS NEEDED   amLODipine (NORVASC) 5 MG tablet Take 1 tablet (5 mg total) by mouth daily.   BREZTRI AEROSPHERE 160-9-4.8 MCG/ACT AERO TAKE 2 PUFFS BY MOUTH TWICE A DAY   Budeson-Glycopyrrol-Formoterol (BREZTRI AEROSPHERE) 160-9-4.8 MCG/ACT AERO Inhale 2 puffs into the lungs daily. >>> 2 puffs in the morning right when you wake up, rinse out your mouth after use, 12 hours later 2 puffs, rinse after use >>> Take this daily, no matter what   diclofenac Sodium (VOLTAREN) 1 % GEL Apply 4 g topically 4 (four) times daily.   guaiFENesin (MUCINEX) 600 MG 12 hr tablet Take 600 mg by mouth as needed.      hydrochlorothiazide (HYDRODIURIL) 25 MG tablet Take 1 tablet (25 mg total) by mouth daily.   omeprazole (PRILOSEC) 20 MG capsule Take 1 capsule (20 mg total) by mouth daily.   potassium chloride (K-DUR) 10 MEQ tablet Take 1 tablet (10 mEq total) by mouth 2 (two) times daily.   Spacer/Aero Chamber [employer] Use as directed   triamcinolone (NASACORT) 55 MCG/ACT AERO nasal inhaler Place 2 sprays into the nose daily.   No facility-administered encounter medications on file as of 06/30/2020.    PHYSICAL EXAM / ROS:  Current and past weights: weight stable,  200lbs, [id number]", BMI 34.3kg/m2 General: obese, frail appearing, coherent and cooperative, siting in chair in NAD Cardiovascular: denied chest pain, 2+ edema to lower legs Pulmonary: no cough, no increased SOB, oxygen saturation 99% on 9L Abdomen: appetite fair, denied constipation, continent of bowel GU: denies dysuria, continent of urine MSK:  no joint and ROM abnormalities, ambulatory Skin: no rashes or wounds reported or noted on exposed skin, skin thin and frail Neurological: weakness, but otherwise non-focal  QJari Favre DNP, AGPCNP-BC

## 2020-06-30 NOTE — Telephone Encounter (Signed)
Gonna defer this to you since we can't get this at 4pm on a Friday.

## 2020-07-02 ENCOUNTER — Other Ambulatory Visit: Payer: Self-pay | Admitting: Nurse Practitioner

## 2020-07-02 DIAGNOSIS — I10 Essential (primary) hypertension: Secondary | ICD-10-CM

## 2020-07-07 ENCOUNTER — Telehealth: Payer: Self-pay | Admitting: Emergency Medicine

## 2020-07-07 NOTE — Telephone Encounter (Signed)
Called and spoke with the pt  She states that she is having issues with getting her Danielle Harmon  She thinks that her insurance may require a 90 day supply but she is unsure  I have called [facility] in [place] and spoke with pharmacy  Was advised that the rx for 90 day supply has already seen called in and is ready for pick up for $40 copay  Pt aware and nothing further needed

## 2020-07-14 ENCOUNTER — Other Ambulatory Visit: Payer: Self-pay

## 2020-07-14 ENCOUNTER — Encounter: Payer: Self-pay | Admitting: Nurse Practitioner

## 2020-07-14 ENCOUNTER — Ambulatory Visit (INDEPENDENT_AMBULATORY_CARE_PROVIDER_SITE_OTHER): Payer: Medicare Other | Admitting: Nurse Practitioner

## 2020-07-14 VITALS — BP 136/82 | HR 84 | Temp 97.5°F | Resp 20 | Ht 64.0 in

## 2020-07-14 DIAGNOSIS — Z6834 Body mass index (BMI) 34.0-34.9, adult: Secondary | ICD-10-CM | POA: Diagnosis not present

## 2020-07-14 DIAGNOSIS — I10 Essential (primary) hypertension: Secondary | ICD-10-CM

## 2020-07-14 DIAGNOSIS — K219 Gastro-esophageal reflux disease without esophagitis: Secondary | ICD-10-CM

## 2020-07-14 DIAGNOSIS — J438 Other emphysema: Secondary | ICD-10-CM

## 2020-07-14 DIAGNOSIS — R2689 Other abnormalities of gait and mobility: Secondary | ICD-10-CM

## 2020-07-14 MED ORDER — AMLODIPINE BESYLATE 5 MG PO TABS: 5.0000 mg | ORAL_TABLET | Freq: Every day | ORAL | 1 refills | Status: DC

## 2020-07-14 MED ORDER — POTASSIUM CHLORIDE ER 10 MEQ PO TBCR: 10.0000 meq | EXTENDED_RELEASE_TABLET | Freq: Two times a day (BID) | ORAL | 1 refills | Status: DC

## 2020-07-14 MED ORDER — OMEPRAZOLE 20 MG PO CPDR: 20.0000 mg | DELAYED_RELEASE_CAPSULE | Freq: Every day | ORAL | 1 refills | Status: DC

## 2020-07-14 MED ORDER — BREZTRI AEROSPHERE 160-9-4.8 MCG/ACT IN AERO: 2.0000 | INHALATION_SPRAY | Freq: Every day | RESPIRATORY_TRACT | 0 refills | Status: DC

## 2020-07-14 MED ORDER — HYDROCHLOROTHIAZIDE 25 MG PO TABS: 25.0000 mg | ORAL_TABLET | Freq: Every day | ORAL | 1 refills | Status: DC

## 2020-07-14 NOTE — Progress Notes (Addendum)
Subjective:    Patient ID: Danielle Harmon, female    DOB: 06/28/1941, 79 y.o.   MRN: [id number]  Chief Complaint: No chief complaint on file.    HPI:  1. Primary hypertension BP Readings from Last 3 Encounters:  07/14/20 (!) 155/87  06/15/20 120/70  04/06/20 124/78  Takes medication as prescribed for the most part. Does not chest blood pressure at home. No chest pain, headaches, or swelling.    2. Other emphysema (HCC) 6L n/c continuous, states she feel she is at baseline with breathing. Uses breathing treatments and inhalers as needed/prescribed.   3. Gastro-esophageal reflux disease without esophagitis Takes medication as prescribed. Does not eat fried or fatty foods.   4 BMI 34.0-34.9 No recent weight changes  Wt Readings from Last 3 Encounters:  06/15/20 201 lb 6.4 oz (91.4 kg)  04/06/20 199 lb (90.3 kg)  03/02/20 201 lb (91.2 kg)   BMI Readings from Last 3 Encounters:  07/14/20 34.57 kg/m  06/15/20 34.57 kg/m  04/06/20 32.12 kg/m     Outpatient Encounter Medications as of 07/14/2020  Medication Sig  . albuterol (PROVENTIL) (2.5 MG/3ML) 0.083% nebulizer solution Take 3 mLs (2.5 mg total) by nebulization every 6 (six) hours as needed for wheezing or shortness of breath. Pt may use every 4 to 6 hours as needed  . albuterol (VENTOLIN HFA) 108 (90 Base) MCG/ACT inhaler TAKE 2 PUFFS BY MOUTH EVERY 6 HOURS AS NEEDED  . amLODipine (NORVASC) 5 MG tablet TAKE 1 TABLET BY MOUTH EVERY DAY  . BREZTRI AEROSPHERE 160-9-4.8 MCG/ACT AERO TAKE 2 PUFFS BY MOUTH TWICE A DAY  . Budeson-Glycopyrrol-Formoterol (BREZTRI AEROSPHERE) 160-9-4.8 MCG/ACT AERO Inhale 2 puffs into the lungs daily. >>> 2 puffs in the morning right when you wake up, rinse out your mouth after use, 12 hours later 2 puffs, rinse after use >>> Take this daily, no matter what  . diclofenac Sodium (VOLTAREN) 1 % GEL Apply 4 g topically 4 (four) times daily.  Marland Kitchen guaiFENesin (MUCINEX) 600 MG 12 hr tablet Take 600 mg by  mouth as needed.   . hydrochlorothiazide (HYDRODIURIL) 25 MG tablet Take 1 tablet (25 mg total) by mouth daily.  Marland Kitchen omeprazole (PRILOSEC) 20 MG capsule Take 1 capsule (20 mg total) by mouth daily.  . potassium chloride (K-DUR) 10 MEQ tablet Take 1 tablet (10 mEq total) by mouth 2 (two) times daily.  Marland Kitchen Spacer/Aero Chamber [employer] Use as directed  . triamcinolone (NASACORT) 55 MCG/ACT AERO nasal inhaler Place 2 sprays into the nose daily.   No facility-administered encounter medications on file as of 07/14/2020.    Past Surgical History:  Procedure Laterality Date  . CHOLECYSTECTOMY    . CYSTECTOMY     benign cysts removed from head and R hand  . KNEE ARTHROSCOPY     Left    Family History  Problem Relation Age of Onset  . Emphysema Father   . COPD Father   . Stroke Mother   . Diabetes Son     New complaints: Needs wheelchair prescription. Has balance issues and is not able to walk without having possibility of falling. Does not have good upper body strength to use a cane or walker.  Social history: Lives at home alone with cat, has son to check on her. Crosswords puzzles, iPad for activities/facetime calls to interact with family.   Controlled substance contract: n/a   Review of Systems  Constitutional: Negative.   HENT: Negative.   Eyes: Negative.  Respiratory: Negative.   Cardiovascular: Negative.   Gastrointestinal: Negative.   Endocrine: Negative.   Genitourinary: Negative.   Musculoskeletal: Negative.   Skin: Negative.   Allergic/Immunologic: Negative.   Neurological: Negative.   Hematological: Negative.   Psychiatric/Behavioral: Negative.   All other systems reviewed and are negative.      Objective:   Physical Exam Constitutional:      Appearance: Normal appearance.  HENT:     Head: Normocephalic and atraumatic.     Right Ear: Tympanic membrane, ear canal and external ear normal.     Left Ear: Tympanic membrane, ear canal and external ear  normal.     Nose: Nose normal.     Mouth/Throat:     Mouth: Mucous membranes are moist.     Pharynx: Oropharynx is clear.  Eyes:     Extraocular Movements: Extraocular movements intact.     Conjunctiva/sclera: Conjunctivae normal.     Pupils: Pupils are equal, round, and reactive to light.  Cardiovascular:     Rate and Rhythm: Normal rate and regular rhythm.     Pulses: Normal pulses.     Heart sounds: Normal heart sounds.  Pulmonary:     Effort: Accessory muscle usage present.     Breath sounds: Normal breath sounds. Decreased air movement present.     Comments: 6L o2 via nasal canulla Abdominal:     General: Abdomen is flat. Bowel sounds are normal.     Palpations: Abdomen is soft.  Musculoskeletal:        General: Normal range of motion.     Cervical back: Normal range of motion.     Right lower leg: Edema (2+) present.     Left lower leg: Edema (2+) present.     Comments: Pt in wheelchair  Skin:    General: Skin is warm and dry.     Capillary Refill: Capillary refill takes less than 2 seconds.  Neurological:     General: No focal deficit present.     Mental Status: She is alert and oriented to person, place, and time. Mental status is at baseline.  Psychiatric:        Mood and Affect: Mood normal.        Behavior: Behavior normal.        Thought Content: Thought content normal.        Judgment: Judgment normal.    BP 136/82 (BP Location: Right Arm, Cuff Size: Large)   Pulse 84   Temp (!) 97.5 F (36.4 C) (Temporal)   Resp 20   Ht 5\' 4"  (1.626 m)   SpO2 100% Comment: 6 liters O2  BMI 34.57 kg/m        Assessment & Plan:  Elloise Roark comes in today with chief complaint of No chief complaint on file.   Diagnosis and orders addressed:  1. Primary hypertension Check blood pressure regularly and take medication as prescribed. Avoid foods high in salt and eat a heart healthy diet.   2. Other emphysema (HCC) Use inhalers as needed/as prescribed. Report any  new or increased shortness of breath.   3. Gastro-esophageal reflux disease without esophagitis Take medication as prescribed. Avoid trigger foods such as spicy or fried foods. Eat smaller meals more frequently verses larger meals. Do not lie down after eating.   4 BMI 34.0-34.9 Discussed diet and exercise for person with BMI >25 Will recheck weight in 3-6 months  5. Balance disorder rx for wheelchair given to family  Labs pending Health  Maintenance reviewed Diet and exercise encouraged  Follow up plan: Follow up in 6 months.    Mary-Margaret Daphine Deutscher, FNP

## 2020-07-14 NOTE — Patient Instructions (Signed)

## 2020-07-15 LAB — LIPID PANEL
Chol/HDL Ratio: 2 ratio (ref 0.0–4.4)
Cholesterol, Total: 203 mg/dL — ABNORMAL HIGH (ref 100–199)
HDL: 104 mg/dL (ref >39)
LDL Chol Calc (NIH): 90 mg/dL (ref 0–99)
Triglycerides: 50 mg/dL (ref 0–149)
VLDL Cholesterol Cal: 9 mg/dL (ref 5–40)

## 2020-07-15 LAB — CBC WITH DIFFERENTIAL/PLATELET
Basophils Absolute: 0.1 10*3/uL (ref 0.0–0.2)
Basos: 1 %
EOS (ABSOLUTE): 0.1 10*3/uL (ref 0.0–0.4)
Eos: 1 %
Hematocrit: 39.3 % (ref 34.0–46.6)
Hemoglobin: 13.1 g/dL (ref 11.1–15.9)
Immature Grans (Abs): 0 10*3/uL (ref 0.0–0.1)
Immature Granulocytes: 0 %
Lymphocytes Absolute: 1.4 10*3/uL (ref 0.7–3.1)
Lymphs: 17 %
MCH: 29.6 pg (ref 26.6–33.0)
MCHC: 33.3 g/dL (ref 31.5–35.7)
MCV: 89 fL (ref 79–97)
Monocytes Absolute: 0.6 10*3/uL (ref 0.1–0.9)
Monocytes: 8 %
Neutrophils Absolute: 5.6 10*3/uL (ref 1.4–7.0)
Neutrophils: 73 %
Platelets: 264 10*3/uL (ref 150–450)
RBC: 4.43 x10E6/uL (ref 3.77–5.28)
RDW: 11.5 % — ABNORMAL LOW (ref 11.7–15.4)
WBC: 7.8 10*3/uL (ref 3.4–10.8)

## 2020-07-15 LAB — CMP14+EGFR
ALT: 14 IU/L (ref 0–32)
AST: 17 IU/L (ref 0–40)
Albumin/Globulin Ratio: 1.6 (ref 1.2–2.2)
Albumin: 4.3 g/dL (ref 3.7–4.7)
Alkaline Phosphatase: 89 IU/L (ref 44–121)
BUN/Creatinine Ratio: 15 (ref 12–28)
BUN: 12 mg/dL (ref 8–27)
Bilirubin Total: 0.4 mg/dL (ref 0.0–1.2)
CO2: 32 mmol/L — ABNORMAL HIGH (ref 20–29)
Calcium: 9.5 mg/dL (ref 8.7–10.3)
Chloride: 92 mmol/L — ABNORMAL LOW (ref 96–106)
Creatinine, Ser: 0.81 mg/dL (ref 0.57–1.00)
GFR calc Af Amer: 80 mL/min/{1.73_m2} (ref >59)
GFR calc non Af Amer: 69 mL/min/{1.73_m2} (ref >59)
Globulin, Total: 2.7 g/dL (ref 1.5–4.5)
Glucose: 101 mg/dL — ABNORMAL HIGH (ref 65–99)
Potassium: 4 mmol/L (ref 3.5–5.2)
Sodium: 136 mmol/L (ref 134–144)
Total Protein: 7 g/dL (ref 6.0–8.5)

## 2020-07-27 ENCOUNTER — Ambulatory Visit: Payer: Medicare Other | Admitting: Pulmonary Disease

## 2020-08-10 ENCOUNTER — Ambulatory Visit: Payer: Medicare Other | Admitting: Emergency Medicine

## 2020-08-11 ENCOUNTER — Other Ambulatory Visit: Payer: Self-pay

## 2020-08-11 ENCOUNTER — Other Ambulatory Visit: Payer: Medicare Other | Admitting: Nurse Practitioner

## 2020-08-11 DIAGNOSIS — Z515 Encounter for palliative care: Secondary | ICD-10-CM

## 2020-08-11 DIAGNOSIS — J449 Chronic obstructive pulmonary disease, unspecified: Secondary | ICD-10-CM

## 2020-08-11 NOTE — Progress Notes (Signed)
[facility] Consult Note Telephone: [phone]  Fax: [phone]  PATIENT NAME: Danielle Harmon [place] [address] [phone] (home)  DOB: [birth year] MRN: [id number]  PRIMARY CARE PROVIDER:    Chevis Pretty, [place],  [place] [address] [phone]  REFERRING PROVIDER:   Chevis Pretty, [place],  [place] [address] [phone]  RESPONSIBLE PARTY:   Extended Emergency Contact Information Primary Emergency Contact: [place] of [place] Phone: [phone] Relation: Son Secondary Emergency Contact: [place] of [place] Phone: [phone] Relation: Relative  I met face to face with patient in home.  ASSESSMENT AND RECOMMENDATIONS:   Advance Care Planning: Goal of care: Patient's goal of care is comfort and be safe in her home. Patient has a med alert neck lace, which she said she has not had the cause to use, she however feels reassured by having it.  Directives: Patient's code status is DNR, she reiterated desire to not be be resuscitated in the event of cardiac or respiratory arrest. Signed DNR and MOST form in home, copies on [facility] EMR. MOST form details includes limited additional intervention, antibiotics if indicated, IV fluids for a defined trial period, feeding tube for defined trial period.   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 Therapeutic listening provided. Patient with endstage COPD, she is dependent on supplemental oxygen. At last palliative care visit, patient was on 9L oxygen via nasal cannula with saturation of 99%. She was at the time encouraged to reduce her flow to 6L to prevent oxygen toxicity. Patient is on 6L today with saturation 96%. She denied increased SOB, report feeling well overall. Patient denied uncontrolled pain,  denied insomnia. Patient's blood pressure was elevated today, she endorsed occassional elevation of her blood pressure. Patient on Amlodipine 1m daily and HCTZ 268mdaily. Amlodipine may need to be increased to 1043mf BP consistently >140/90.  Provided general support and encouragement, no other unmet needs identified. Palliative care will continue to provide support to patient, family and the medical team.   Follow up Palliative Care Visit: Palliative care will continue to follow for goals of care clarification and symptom management. Return in about 4 weeks or prn.  Family /Caregiver/Community Supports: Patient lives in her home independently, she has three children who are very involved in her care. Patient is a retired midPatent examinerhe taught for 33y25yr Cognitive / Functional decline: Patient alert and oriented, enjoys doing crossword puzzles, has a good knowledge base of her illness. Patient lives alone, able to cook light meals, completes her ADLs independently. Ambulates within her home independently, uses walker or wheelchair when going outside her house. No report of recent falls. She is continent of bowel and bladder.  I spent 48 minutes providing this consultation, time includes time spent with patient, chart review, and documentation. More than 50% of the time in this consultation was spent coordinating communication.   HISTORY OF PRESENT ILLNESS:  Danielle Harmon 79 y69. year old female with multiple medical problems including COPD (emphysema), allergic rhinitis, GERD, HTN. Palliative Care was asked to follow this patient by consultation request of BriaWyn Quaker to help address advance care planning and goals of care. This is a follow up visit from 06/30/2020.  CODE STATUS: DNR  PPS: 60%  HOSPICE ELIGIBILITY/DIAGNOSIS: TBD. Patient declined establishing with Hospice care in  the past because she wanted to remain on her inhalers.  PHYSICAL EXAM / ROS:  V/S: BP  162/90, P 72, RR 18, sats 96% on 6L supplemental oxygen Current and past weights: BMI 34.3kg/m2 General: obese, frail appearing, coherent and cooperative, siting in chair in NAD Cardiovascular: denied chest pain, 2+ edema to lower legs Pulmonary: no cough, no increased SOB, oxygen saturation 96% on 6L Abdomen: appetite fair, denied constipation, continent of bowel GU: denies dysuria, continent of urine MSK:  no joint and ROM abnormalities, ambulatory Skin: no rashes or wounds reported or noted on exposed skin Neurological: weakness, but otherwise non-focal  PAST MEDICAL HISTORY:  Past Medical History:  Diagnosis Date  . COPD (chronic obstructive pulmonary disease) ([facility])   . GERD (gastroesophageal reflux disease)   . Hypertension     SOCIAL HX:  Social History   Tobacco Use  . Smoking status: Former Smoker    Packs/day: 1.00    Years: 40.00    Pack years: 40.00    Types: Cigarettes    Quit date: 09/23/2002    Years since quitting: 17.8  . Smokeless tobacco: Never Used  Substance Use Topics  . Alcohol use: Yes    Comment: occ   FAMILY HX:  Family History  Problem Relation Age of Onset  . Emphysema Father   . COPD Father   . Stroke Mother   . Diabetes Son     ALLERGIES:  Allergies  Allergen Reactions  . Bee Venom Anaphylaxis     PERTINENT MEDICATIONS:  Outpatient Encounter Medications as of 08/11/2020  Medication Sig  . albuterol (PROVENTIL) (2.5 MG/3ML) 0.083% nebulizer solution Take 3 mLs (2.5 mg total) by nebulization every 6 (six) hours as needed for wheezing or shortness of breath. Pt may use every 4 to 6 hours as needed  . albuterol (VENTOLIN HFA) 108 (90 Base) MCG/ACT inhaler TAKE 2 PUFFS BY MOUTH EVERY 6 HOURS AS NEEDED  . amLODipine (NORVASC) 5 MG tablet Take 1 tablet (5 mg total) by mouth daily.  Marland Kitchen BREZTRI AEROSPHERE 160-9-4.8 MCG/ACT AERO TAKE 2 PUFFS BY MOUTH TWICE A DAY  . Budeson-Glycopyrrol-Formoterol (BREZTRI AEROSPHERE) 160-9-4.8 MCG/ACT AERO Inhale  2 puffs into the lungs daily. >>> 2 puffs in the morning right when you wake up, rinse out your mouth after use, 12 hours later 2 puffs, rinse after use >>> Take this daily, no matter what  . diclofenac Sodium (VOLTAREN) 1 % GEL Apply 4 g topically 4 (four) times daily.  Marland Kitchen guaiFENesin (MUCINEX) 600 MG 12 hr tablet Take 600 mg by mouth as needed.   . hydrochlorothiazide (HYDRODIURIL) 25 MG tablet Take 1 tablet (25 mg total) by mouth daily.  Marland Kitchen omeprazole (PRILOSEC) 20 MG capsule Take 1 capsule (20 mg total) by mouth daily.  . potassium chloride (KLOR-CON) 10 MEQ tablet Take 1 tablet (10 mEq total) by mouth 2 (two) times daily.  Marland Kitchen Spacer/Aero Chamber [employer] Use as directed  . triamcinolone (NASACORT) 55 MCG/ACT AERO nasal inhaler Place 2 sprays into the nose daily.   No facility-administered encounter medications on file as of 08/11/2020.     Jari Favre, DNP, AGPCNP-BC

## 2020-09-07 ENCOUNTER — Encounter: Payer: Self-pay | Admitting: Emergency Medicine

## 2020-09-07 ENCOUNTER — Ambulatory Visit (INDEPENDENT_AMBULATORY_CARE_PROVIDER_SITE_OTHER): Payer: Medicare Other | Admitting: Emergency Medicine

## 2020-09-07 ENCOUNTER — Other Ambulatory Visit: Payer: Self-pay

## 2020-09-07 DIAGNOSIS — J9611 Chronic respiratory failure with hypoxia: Secondary | ICD-10-CM | POA: Diagnosis not present

## 2020-09-07 DIAGNOSIS — J438 Other emphysema: Secondary | ICD-10-CM

## 2020-09-07 MED ORDER — ALBUTEROL SULFATE HFA 108 (90 BASE) MCG/ACT IN AERS: 2.0000 | INHALATION_SPRAY | RESPIRATORY_TRACT | 5 refills | Status: DC | PRN

## 2020-09-07 MED ORDER — PREDNISONE 10 MG PO TABS: ORAL_TABLET | ORAL | 0 refills | Status: DC

## 2020-09-07 NOTE — Progress Notes (Signed)
   Subjective:    Patient ID: Danielle Harmon, female    DOB: [birth year], 79 y.o.   MRN: [id number]  HPI  ROV 03/02/20 --follow-up visit for 79 year old woman with severe COPD, severe obstruction by spirometry 2012, associated chronic hypoxemic respiratory failure.  She uses oxygen at 6 L/min. I last spoke to her on a tele-visit 10/21/2019.  She has been managed on Trelegy.  She uses albuterol approximately 2-3x a day. Does not use her nebulizer.  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  Pneumonia vaccine up to date  ROV 09/07/20 --Danielle Harmon is 79 and has a history of severe COPD and associated chronic hypoxemic respiratory failure.  Also with allergic rhinitis.  She has been most recently seen by B. Mack in our office in September.  She is getting assistance from Palliative Care.  On oxygen at 4-6 L/min.  She has albuterol which she uses approximately.  Her current maintenance medication is Clinical cytogeneticist (changed from [employer]).  She uses albuterol 2x a day.  She reports that her breathing is very difficult - she has SOB with moving through the house.  She coughs most days, in the afternoon, clears some mucous. She is on mucinex bid.   MDM: Reviewed PC notes from 08/11/20 Pulm notes from 06/15/20  CODE STATUS DNR/DNI  Review of Systems As per HPI     Objective:   Physical Exam  Vitals:   09/07/20 0958  BP: 136/72  Pulse: 68  Temp: 97.6 F (36.4 C)  SpO2: 100%  Weight: 198 lb 12.8 oz (90.2 kg)  Height: 5\' 6"  (1.676 m)   Gen: Pleasant, well-nourished, in no distress in wheelchair,  normal affect, O2 in place  ENT: No lesions,  mouth clear,  oropharynx clear, no postnasal drip  Neck: No JVD, no stridor  Lungs: No use of accessory muscles, good air movement, no wheezing or crackles  Cardiovascular: RRR, heart sounds normal, no murmur or gallops, 1+ LE peripheral edema w compression  stockings  Musculoskeletal: No deformities, no cyanosis or clubbing  Neuro: alert, non focal  Skin: Warm, no lesions or rashes      Assessment & Plan:  COPD (chronic obstructive pulmonary disease) with emphysema Please continue your Breztri 2 puffs twice a day.  Rinse and gargle after using. You can use your albuterol either 2 puffs or 1 nebulizer treatment up to every 4 hours if needed for shortness of breath, chest tightness, wheezing. Continue Mucinex twice a day We will start prednisone.  Take 20 mg daily for 5 days, then decrease to 10 mg daily until we follow-up to assess your response. Agree with continuing your Palliative Care services. Follow-up with either Dr. or with Delton Coombes by video visit in about 1 month  Chronic respiratory failure with hypoxia (HCC) Continue your oxygen at 4-6 L/min at all times.  Our goal saturations are greater than 90%  Bufford Spikes, MD, PhD 09/07/2020, 10:25 AM [facility] [phone] or if no answer [phone]

## 2020-09-07 NOTE — Assessment & Plan Note (Signed)
Please continue your Breztri 2 puffs twice a day.  Rinse and gargle after using. You can use your albuterol either 2 puffs or 1 nebulizer treatment up to every 4 hours if needed for shortness of breath, chest tightness, wheezing. Continue Mucinex twice a day We will start prednisone.  Take 20 mg daily for 5 days, then decrease to 10 mg daily until we follow-up to assess your response. Agree with continuing your Palliative Care services. Follow-up with either Dr. Delton Coombes or with Bufford Spikes by video visit in about 1 month

## 2020-09-07 NOTE — Assessment & Plan Note (Signed)
Continue your oxygen at 4-6 L/min at all times.  Our goal saturations are greater than 90%

## 2020-09-07 NOTE — Patient Instructions (Signed)
Please continue your Breztri 2 puffs twice a day.  Rinse and gargle after using. You can use your albuterol either 2 puffs or 1 nebulizer treatment up to every 4 hours if needed for shortness of breath, chest tightness, wheezing. We will start prednisone.  Take 20 mg daily for 5 days, then decrease to 10 mg daily until we follow-up to assess your response. Continue your oxygen at 4-6 L/min at all times.  Our goal saturations are greater than 90% Agree with continuing your Palliative Care services. Follow-up with either Dr. Delton Coombes or with Bufford Spikes by video visit in about 1 month

## 2020-09-08 ENCOUNTER — Other Ambulatory Visit: Payer: Medicare Other | Admitting: Nurse Practitioner

## 2020-09-08 ENCOUNTER — Telehealth: Payer: Self-pay | Admitting: Emergency Medicine

## 2020-09-08 DIAGNOSIS — J439 Emphysema, unspecified: Secondary | ICD-10-CM

## 2020-09-08 DIAGNOSIS — Z515 Encounter for palliative care: Secondary | ICD-10-CM

## 2020-09-08 MED ORDER — PREDNISONE 10 MG PO TABS: ORAL_TABLET | ORAL | 0 refills | Status: DC

## 2020-09-08 NOTE — Progress Notes (Signed)
[facility] Consult Note Telephone: [phone]  Fax: [phone]  PATIENT NAME: Danielle Harmon [place] [address] [phone] (home)  DOB: Feb 19, 1941 MRN: [id number]  PRIMARY CARE PROVIDER:    Chevis Pretty, [place],  [place] [address] [phone]  REFERRING PROVIDER:   Chevis Pretty, [place],  [place] [address] [phone]  RESPONSIBLE PARTY:   Extended Emergency Contact Information Primary Emergency Contact: [place] of [place] Phone: [phone] Relation: Son Secondary Emergency Contact: Danielle Harmon of [place] Phone: [phone] Relation: Relative  I met face to face with patient in home.   ASSESSMENT AND RECOMMENDATIONS:   Odor of natural gas noted on entry into patient's home. Patient report she was trying to use her gas log. Patient denied smelling any gas. Patient could not remember which company supplies her gas. Phone call made to her daughter Danielle Harmon, made her aware that there is an odor of gas in patient's home. I asked if she know the name of the [facility] for me to call and report possible gas leak. Danielle Harmon said she would call the company and request for someone to check on patient. Patient in no acute distress, report that she uses electric stove.  Advance Care Planning:  Signed DNR and MOST form present in home and on [facility] EMR. Details of MOST form includes limited additional intervention, antibiotics if indicated, IV fluids for a defined trial period, feeding tube for defined trial period. Patient's goal of care is comfort and to be safe in her home. She has a med alert neck lace, which she said she has not had the cause to use, she however feels reassured by having it.   Symptom Management:  SOB  With activity: Patient was seen by her Pulminologist Dr. Lamonte Sakai yesterday.  She was started on Prednisone. Order is for Prednisone 27m by mouth daily for 5 days, then decrease dose to 135mdaily until she is seen by pulmonology in a month. Patient took her first dose today without ant report of intolerance.. Patient continues on Breztri 2 puffs twice a day and Albuterol either 2 puffs or 1 vial via nebulizer treatment up to every 4hrs PRN for SOB, wheezing, or chest tightness. Patient report using Albuterol mostly twice a day, occassionally 3 times a day, report good response with usage. Patient denied fever, chills, or productive cough. She however report increased SOB even with minimal activity. Recommendation: Patient to continue current medication regimen. Patient encouraged to use Albuterol up to 4-6 times day if needed.  Pain: Patient denied pain today. She report occassional arthritic pain, especially when it is cold outside. She report pain is usually relieved by Tylenol or Aleeve. Provided general support and encouragement, no other unmet needs identified.  Follow up Palliative Care Visit: Palliative care will continue to follow for goals of care clarification and symptom management. Return in about 6-8 weeks or prn.  Family /Caregiver/Community Supports: Patient lives at home by herself. She has three children who very involved in her care. She report her family coming to her home for Christmas celebration. She is a retired scEducation officer, museum Cognitive / Functional decline: Patient awake and alert. She report increasing episodes of SOB with even slight activity. She ambulates within her home independentlly, uses wheelchair when outside home. No report of recent fall, she is continent of bowel and bladder.  I spent 48 minutes providing this  consultation, time includes time spent with patient, chart review, and documentation. More than 50% of the time in this consultation was spent counseling and coordinating communication.   CHIEF COMPLAINT: SOB with activity  History  obtained from review of EMR, discussion with primary team, and  interview with patient. Records reviewed and summarized bellow.  HISTORY OF PRESENT ILLNESS:Oliviana Foleyis a 79 y.o.year old femalewith multiple medical problems including COPD (emphysema), allergic rhinitis, GERD, HTN.Patient with end stage COPD oxygen dependent, currently on 6L supplemental oxygen. Palliative Care was asked to help address advance care planning, and symptoms management. This is a follow upvisit from 08/11/2020.  CODE STATUS: DNR  PPS: 50%  HOSPICE ELIGIBILITY/DIAGNOSIS: yes/ COPD.  Patient however declined Hospice care as she want to continue using Breztri inhaler which is not on Hospice formulary.  PHYSICAL EXAM / ROS:   BP 142/92, P 98, RR 20, oxygen saturation 94% on 6L  Current and past weights: 198lbs, Ht 51f 6', BMI 32.09kg/m2 General:obese,frail appearing,coherent and cooperative, siting in chair in NAD Cardiovascular:deniedchest pain, 2+edema to lower legs Pulmonary: no acute cough, no increased SOB, no adventitious lung sound auscultated Abdomen: appetite fair,deniedconstipation GU: denies dysuria MSK: no joint and ROM abnormalities, ambulatory Skin: no rashes or wounds reportedor noted on exposed skin, thin and fragile Neurological: weakness, but otherwise non-focal Psych: non -anxious affect  PAST MEDICAL HISTORY:  Past Medical History:  Diagnosis Date  . COPD (chronic obstructive pulmonary disease) ([facility])   . GERD (gastroesophageal reflux disease)   . Hypertension     SOCIAL HX:  Social History   Tobacco Use  . Smoking status: Former Smoker    Packs/day: 1.00    Years: 40.00    Pack years: 40.00    Types: Cigarettes    Quit date: 09/23/2002    Years since quitting: 17.9  . Smokeless tobacco: Never Used  Substance Use Topics  . Alcohol use: Yes    Comment: occ   FAMILY HX:  Family History  Problem Relation Age of Onset  . Emphysema Father   . COPD Father   .  Stroke Mother   . Diabetes Son     ALLERGIES:  Allergies  Allergen Reactions  . Bee Venom Anaphylaxis     PERTINENT MEDICATIONS:  Outpatient Encounter Medications as of 09/08/2020  Medication Sig  . albuterol (PROVENTIL) (2.5 MG/3ML) 0.083% nebulizer solution Take 3 mLs (2.5 mg total) by nebulization every 6 (six) hours as needed for wheezing or shortness of breath. Pt may use every 4 to 6 hours as needed  . albuterol (VENTOLIN HFA) 108 (90 Base) MCG/ACT inhaler Inhale 2 puffs into the lungs every 4 (four) hours as needed for wheezing or shortness of breath.  Marland Kitchen amLODipine (NORVASC) 5 MG tablet Take 1 tablet (5 mg total) by mouth daily.  Marland Kitchen BREZTRI AEROSPHERE 160-9-4.8 MCG/ACT AERO TAKE 2 PUFFS BY MOUTH TWICE A DAY  . Budeson-Glycopyrrol-Formoterol (BREZTRI AEROSPHERE) 160-9-4.8 MCG/ACT AERO Inhale 2 puffs into the lungs daily. >>> 2 puffs in the morning right when you wake up, rinse out your mouth after use, 12 hours later 2 puffs, rinse after use >>> Take this daily, no matter what  . diclofenac Sodium (VOLTAREN) 1 % GEL Apply 4 g topically 4 (four) times daily.  Marland Kitchen guaiFENesin (MUCINEX) 600 MG 12 hr tablet Take 600 mg by mouth as needed.   . hydrochlorothiazide (HYDRODIURIL) 25 MG tablet Take 1 tablet (25 mg total) by mouth daily.  Marland Kitchen omeprazole (PRILOSEC) 20 MG capsule Take 1  capsule (20 mg total) by mouth daily.  . potassium chloride (KLOR-CON) 10 MEQ tablet Take 1 tablet (10 mEq total) by mouth 2 (two) times daily.  . predniSONE (DELTASONE) 10 MG tablet 71m daily for 5 days then 171mdaily chronically  . Spacer/Aero Chamber [employer]arshall [employer] Ils[employer]se as directed  . triamcinolone (NASACORT) 55 MCG/ACT AERO nasal inhaler Place 2 sprays into the nose daily.   No facility-administered encounter medications on file as of 09/08/2020.    Thank you for the opportunity to participate in the care of Ms. BrCourt JoyThe palliative care team will continue to follow. Please call our office at  [phone] we can be of additional assistance.   QuJari FavreDNP, AGPCNP-BC

## 2020-09-08 NOTE — Telephone Encounter (Signed)
Called and spoke with Patient. Patient stated she started taking her Prednisone prescribed by Dr. Delton Coombes and realized she only had 5 days worth.  Patient was concerned she didn't receive enough Prednisone and upcoming weekend. Advised Patient of Dr. Kavin Leech AVS instructions. Prednisone prescription sent to requested pharmacy to start 10/15/19 (day 6) until 10/12/20 OV with Brian,NP.  OV 09/07/20 Dr. Delton Coombes-  Instructions  Please continue your Breztri 2 puffs twice a day.  Rinse and gargle after using. You can use your albuterol either 2 puffs or 1 nebulizer treatment up to every 4 hours if needed for shortness of breath, chest tightness, wheezing. We will start prednisone.  Take 20 mg daily for 5 days, then decrease to 10 mg daily until we follow-up to assess your response. Continue your oxygen at 4-6 L/min at all times.  Our goal saturations are greater than 90% Agree with continuing your Palliative Care services. Follow-up with either Dr. Delton Coombes or with Bufford Spikes by video visit in about 1 month

## 2020-09-27 ENCOUNTER — Telehealth: Payer: Self-pay | Admitting: Emergency Medicine

## 2020-09-27 NOTE — Telephone Encounter (Signed)
RB please advise if the pt may use her trelegy for the next week until her insurance will cover the breztri again.  thanks

## 2020-09-28 NOTE — Telephone Encounter (Signed)
Yes that would be Ok >> 1 inhalation daily for the Trelegy, Rinse and gargle after

## 2020-09-28 NOTE — Telephone Encounter (Signed)
Called and spoke with pt and she is aware of RB recs.  Nothing further is needed.  

## 2020-10-05 ENCOUNTER — Other Ambulatory Visit: Payer: Self-pay | Admitting: Emergency Medicine

## 2020-10-12 ENCOUNTER — Ambulatory Visit: Payer: Medicare Other | Admitting: Pulmonary Disease

## 2020-10-16 ENCOUNTER — Ambulatory Visit (INDEPENDENT_AMBULATORY_CARE_PROVIDER_SITE_OTHER): Payer: Medicare Other | Admitting: Pulmonary Disease

## 2020-10-16 ENCOUNTER — Encounter: Payer: Self-pay | Admitting: Pulmonary Disease

## 2020-10-16 ENCOUNTER — Other Ambulatory Visit: Payer: Self-pay

## 2020-10-16 DIAGNOSIS — J9611 Chronic respiratory failure with hypoxia: Secondary | ICD-10-CM | POA: Diagnosis not present

## 2020-10-16 DIAGNOSIS — Z0000 Encounter for general adult medical examination without abnormal findings: Secondary | ICD-10-CM

## 2020-10-16 DIAGNOSIS — J438 Other emphysema: Secondary | ICD-10-CM | POA: Diagnosis not present

## 2020-10-16 NOTE — Patient Instructions (Addendum)
You were seen today by Coral Ceo, NP  for:   Is a pleasure talking with you today over the phone.  I am glad that you are doing well.  We will not change any of your medications.  We will leave you on prednisone 10 mg daily.  We may decide to decrease this in the future.  As discussed today I would recommend that you monitor your oxygen levels and make sure they are above 88%.  Would recommend at least 4 L of O2 at rest and at least 6 L of O2 with physical exertion when you are up moving around.  We will plan on seeing you back towards the end of March beginning of April/2022.  If you have not been scheduled by the beginning of March for 1 of these office visits please contact our office so that way we can coordinate this with Dr. Delton Coombes.  Please let us know if you need anything or if you have additional questions,  Arlys John  1. Other emphysema (HCC)  Continue Prednisone 10mg  daily  Breztri >>> 2 puffs in the morning right when you wake up, rinse out your mouth after use, 12 hours later 2 puffs, rinse after use >>> Take this daily, no matter what >>> This is not a rescue inhaler   Only use your albuterol as a rescue medication to be used if you can't catch your breath by resting or doing a relaxed purse lip breathing pattern.  - The less you use it, the better it will work when you need it. - Ok to use up to 2 puffs  every 4 hours if you must but call for immediate appointment if use goes up over your usual need - Don't leave home without it !!  (think of it like the spare tire for your car)   Note your daily symptoms > remember "red flags" for COPD:   >>>Increase in cough >>>increase in sputum production >>>increase in shortness of breath or activity  intolerance.   If you notice these symptoms, please call the office to be seen.   2. Chronic respiratory failure with hypoxia (HCC)  As discussed today would recommend the use at least 4 L of O2 at rest Would recommend that you  increase her oxygen needs to 6 L with physical exertion  Please maintain oxygen saturations above 88%  Continue oxygen therapy as prescribed  >>>maintain oxygen saturations greater than 88 percent  >>>if unable to maintain oxygen saturations please contact the office  >>>do not smoke with oxygen  >>>can use nasal saline gel or nasal saline rinses to moisturize nose if oxygen causes dryness   3. Healthcare maintenance  Great job being up-to-date with your Covid vaccinations and your flu vaccine    Follow Up:    Return in about 2 months (around 12/14/2020), or if symptoms worsen or fail to improve, for Follow up with Dr. 12/16/2020.   Notification of test results are managed in the following manner: If there are  any recommendations or changes to the  plan of care discussed in office today,  we will contact you and let you know what they are. If you do not hear from Delton Coombes, then your results are normal and you can view them through your  MyChart account , or a letter will be sent to you. Thank you again for trusting [place] with your care  - Thank you, [facility]    It is flu season:   >>> Best  ways to protect herself from the flu: Receive the yearly flu vaccine, practice good hand hygiene washing with soap and also using hand sanitizer when available, eat a nutritious meals, get adequate rest, hydrate appropriately       Please contact the office if your symptoms worsen or you have concerns that you are not improving.   Thank you for choosing [facility] for your healthcare, and for allowing [place] to partner with you on your healthcare journey. I am thankful to be able to provide care to you today.   Elisha Headland FNP-C    COPD and Physical Activity Chronic obstructive pulmonary disease (COPD) is a long-term (chronic) condition that affects the lungs. COPD is a general term that can be used to describe many different lung problems that cause lung swelling (inflammation) and  limit airflow, including chronic bronchitis and emphysema. The main symptom of COPD is shortness of breath, which makes it harder to do even simple tasks. This can also make it harder to exercise and be active. Talk with your health care provider about treatments to help you breathe better and actions you can take to prevent breathing problems during physical activity. What are the benefits of exercising with COPD? Exercising regularly is an important part of a healthy lifestyle. You can still exercise and do physical activities even though you have COPD. Exercise and physical activity improve your shortness of breath by increasing blood flow (circulation). This causes your heart to pump more oxygen through your body. Moderate exercise can improve your:  Oxygen use.  Energy level.  Shortness of breath.  Strength in your breathing muscles.  Heart health.  Sleep.  Self-esteem and feelings of self-worth.  Depression, stress, and anxiety levels. Exercise can benefit everyone with COPD. The severity of your disease may affect how hard you can exercise, especially at first, but everyone can benefit. Talk with your health care provider about how much exercise is safe for you, and which activities and exercises are safe for you.   What actions can I take to prevent breathing problems during physical activity?  Sign up for a pulmonary rehabilitation program. This type of program may include: ? Education about lung diseases. ? Exercise classes that teach you how to exercise and be more active while improving your breathing. This usually involves:  Exercise using your lower extremities, such as a stationary bicycle.  About 30 minutes of exercise, 2 to 5 times per week, for 6 to 12 weeks  Strength training, such as push ups or leg lifts. ? Nutrition education. ? Group classes in which you can talk with others who also have COPD and learn ways to manage stress.  If you use an oxygen tank, you  should use it while you exercise. Work with your health care provider to adjust your oxygen for your physical activity. Your resting flow rate is different from your flow rate during physical activity.  While you are exercising: ? Take slow breaths. ? Pace yourself and do not try to go too fast. ? Purse your lips while breathing out. Pursing your lips is similar to a kissing or whistling position. ? If doing exercise that uses a quick burst of effort, such as weight lifting:  Breathe in before starting the exercise.  Breathe out during the hardest part of the exercise (such as raising the weights). Where to find support You can find support for exercising with COPD from:  Your health care provider.  A pulmonary rehabilitation program.  Your local health department or community health programs.  Support groups, online or in-person. Your health care provider may be able to recommend support groups. Where to find more information You can find more information about exercising with COPD from:  American Lung Association: [place].hulung.org.  COPD Foundation: AlmostHot.glcopdfoundation.org. Contact a health care provider if:  Your symptoms get worse.  You have chest pain.  You have nausea.  You have a fever.  You have trouble talking or catching your breath.  You want to start a new exercise program or a new activity. Summary  COPD is a general term that can be used to describe many different lung problems that cause lung swelling (inflammation) and limit airflow. This includes chronic bronchitis and emphysema.  Exercise and physical activity improve your shortness of breath by increasing blood flow (circulation). This causes your heart to provide more oxygen to your body.  Contact your health care provider before starting any exercise program or new activity. Ask your health care provider what exercises and activities are safe for you. This information is not intended to replace advice given to  you by your health care provider. Make sure you discuss any questions you have with your health care provider. Document Revised: 12/30/2018 Document Reviewed: 10/02/2017 Elsevier Patient Education  2021 Elsevier Inc.    Home Oxygen Use, Adult When a medical condition keeps you from getting enough oxygen, your health care provider may instruct you to take extra oxygen at home. Your health care provider will let you know:  When to take oxygen.  How long to take oxygen.  How quickly oxygen should be delivered (flow rate), in liters per minute (LPM or L/M). Home oxygen can be given through:  A mask.  A nasal cannula. This is a device or tube that goes in the nostrils.  A transtracheal catheter. This is a small, thin tube placed in the windpipe (trachea).  A breathing tube (tracheostomy tube) that is surgically placed in the windpipe. This may be used in severe cases. These devices are connected with tubing to an oxygen source, such as:  A tank. Tanks hold oxygen in gas form. They must be replaced when the oxygen is used up.  A liquid oxygen device. This holds oxygen in liquid form. Liquid oxygen is very cold. It must be replaced when the oxygen is used up.  An oxygen concentrator machine. This filters oxygen in the room. There are two types of oxygen concentrator machines--stationary and portable. ? A stationary oxygen concentrator machine plugs into the main electricity supply at your home. You must have a backup cylinder of oxygen in case the power goes out. ? A portable oxygen concentrator machine is smaller in size and more lightweight. This machine uses battery supply and can be used outside the home. Work with your health care provider to find equipment that works best for you and your lifestyle. What are the risks? Delivery of supplemental oxygen is generally safe. However, some risks include:  Fire. This can happen if the oxygen is exposed to a heat source, flame, or  spark.  Injury to skin. This can happen if liquid oxygen touches your skin.  Damage to the lungs or other organs. This can happen from getting too little or too much oxygen. Supplies needed: To use oxygen, you will need:  A mask, nasal cannula, transtracheal catheter, or tracheostomy.  An oxygen tank, a liquid oxygen device, or an oxygen concentrator.  The tape that your health care provider recommends (optional).  Your health care provider may also recommend:  A humidifier to warm and moisten the oxygen delivered. This will depend on how much oxygen you need and the type of home oxygen device you use.  A pulse oximeter. This device measures the percentage of oxygen in your blood. How to use oxygen Your health care provider or a person from your medical device company will show you how to use your oxygen device. Follow his or her instructions. The instructions may look something like this: 1. Wash your hands with soap and water. 2. If you use an oxygen concentrator, make sure it is plugged in. 3. Place one end of the tube into the port on the tank, device, or machine. 4. Place the mask over your nose and mouth. Or, place the nasal cannula and secure it with tape if instructed. If you use a tracheostomy or transtracheal catheter, connect it to the oxygen source as directed. 5. Make sure the liter-flow setting on the machine is at the level prescribed by your health care provider. 6. Turn on the machine or adjust the knob on the tank or device to the correct liter-flow setting. 7. When you are done, turn off and unplug the machine, or turn the knob to OFF.   How to clean and care for the oxygen supplies Nasal cannula  Clean it with a warm, wet cloth daily or as needed.  Wash it with a liquid soap once a week.  Rinse it thoroughly once or twice a week.  Air-dry it.  Replace it every 2-4 weeks.  If you have an infection, such as a cold or pneumonia, change the cannula when you get  better. Mask  Replace it every 2-4 weeks.  If you have an infection, such as a cold or pneumonia, change the mask when you get better. Humidifier bottle  Wash the bottle between each refill: ? Wash it with soap and warm water. ? Rinse it thoroughly. ? Clean it and its top with a disinfectant cleaner. ? Air-dry it. ? Make sure it is dry before you refill it. Oxygen concentrator  Clean the air filter at least twice a week according to directions from your home medical equipment and service company.  Wipe down the cabinet every day. To do this: ? Unplug the unit. ? Wipe down the cabinet with a damp cloth. ? Dry the cabinet. Other equipment  Change any extra tubing every 1-3 months.  Follow instructions from your health care provider about taking care of any other equipment. Safety tips Fire safety tips  Keep your oxygen and oxygen supplies at least 6 ft (2 m) away from sources of heat, flames, and sparks at all times.  Do not allow smoking near your oxygen. Put up "no smoking" signs in your home. Avoid smoking areas when in public.  Do not use materials that can burn (are flammable) while you use oxygen. This includes: ? Petroleum jelly. ? Hair spray or other aerosol sprays. ? Rubbing alcohol. ? Hand sanitizer.  When you go to a restaurant with portable oxygen, ask to be seated in the non-smoking section.  Keep a Government social research officer close by. Let your fire department know that you have oxygen in your home.  Test your home smoke detectors regularly.   Traveling  Secure your oxygen tank in the vehicle so that it does not move around. Follow instructions from your medical device company about how to safely secure your tank.  Make sure you have enough oxygen for  the amount of time you will be away from home.  If you are planning to travel by public transportation (airplane, train, bus, or boat), contact the company to find out if it allows the use of an approved portable  oxygen concentrator. You may also need documents from your health care provider and medical device company before you travel. General safety tips  If you use an oxygen cylinder, make sure it is in a stand or secured to an object that will not move (fixed object).  If you use liquid oxygen, make sure its container is kept upright at all times.  If you use an oxygen concentrator: ? Catering managerTell your electric company. Make sure you are given priority service in the event that your power goes out. ? Avoid using extension cords if possible. Follow these instructions at home:  Use oxygen only as told by your health care provider.  Do not use alcohol or other drugs that make you relax (sedating drugs) unless instructed. They can slow down your breathing rate and make it hard to get in enough oxygen.  Know how and when to order a refill of oxygen.  Always keep a spare tank of oxygen. Plan ahead for holidays when you may not be able to get a prescription filled.  Use water-based lubricants on your lips or nostrils. Do not use oil-based products like petroleum jelly.  To prevent skin irritation on your cheeks or behind your ears, tuck some gauze under the tubing. Where to find more information  American Lung Association: [employer]Shark.itwww.lung.org/oxygen Contact a health care provider if:  You get headaches often.  You have a lasting cough.  You are restless or have anxiety.  You develop an illness that affects your breathing.  You cannot exercise at your regular level.  You have a fever.  You have persistent redness under your nose. Get help right away if:  You are confused.  You are sleepy all the time.  You have blue lips or fingernails.  You have difficult or irregular breathing that is getting worse.  You are struggling to breathe. These symptoms may represent a serious problem that is an emergency. Do not wait to see if the symptoms will go away. Get medical help right away. Call your local  emergency services (911 in the [place] Do not drive yourself to the hospital. Summary  Your health care provider or a person from your medical device company will show you how to use your oxygen device. Follow his or her instructions.  If you use an oxygen concentrator, make sure it is plugged in.  Make sure the liter-flow setting on the machine is at the level prescribed by your health care provider.  Use oxygen only as told by your health care provider.  Keep your oxygen and oxygen supplies at least 6 ft (2 m) away from sources of heat, flames, and sparks at all times. This information is not intended to replace advice given to you by your health care provider. Make sure you discuss any questions you have with your health care provider. Document Revised: 11/11/2019 Document Reviewed: 09/07/2019 Elsevier Patient Education  2021 [employer]

## 2020-10-16 NOTE — Assessment & Plan Note (Signed)
Up-to-date with flu vaccine Up-to-date with COVID-19 vaccinations

## 2020-10-16 NOTE — Assessment & Plan Note (Signed)
Plan:  Continue Breztri  Stop Trelegy Ellipta Continue 10 mg of prednisone daily, could consider decreasing prednisone in the future Remain established with palliative care Continue oxygen therapy Follow-up with Dr. Delton Coombes in 2 to 3 months

## 2020-10-16 NOTE — Assessment & Plan Note (Signed)
Plan: Continue oxygen therapy Maintain oxygen saturations above 88 to 90% As discussed today would recommend at least 4 L of O2 at rest Would recommend at least 6 L of O2 with physical exertion

## 2020-10-16 NOTE — Progress Notes (Signed)
Virtual Visit via Telephone Note  I connected with Danielle Harmon on 10/16/20 at 10:00 AM EST by telephone and verified that I am speaking with the correct person using two identifiers.  Location: Patient: Home Provider: Office [facility] - [address] [place], [address], [place], [place] [address]   I discussed the limitations, risks, security and privacy concerns of performing an evaluation and management service by telephone and the availability of in person appointments. I also discussed with the patient that there may be a patient responsible charge related to this service. The patient expressed understanding and agreed to proceed.  Patient consented to consult via telephone: Yes People present and their role in pt care: Pt    History of Present Illness:  80 year old female former smoker followed in our office for COPD and chronic respiratory failure  PMH: Hypertension, GERD Smoker/ Smoking History: Former smoker.  Quit 2004.  40-pack-year smoking history Maintenance: Breztri  Pt of: Dr. Delton Coombes  Chief complaint: 4 week follow up   80 year old female former smoker followed in our office for COPD and chronic respiratory failure.  Completed 4-week follow-up with our office today.  Established with Dr. Delton Coombes.  She temporarily had to stop Breztri and resume Trelegy Ellipta.  This was due to pharmaceutical coverage, per patient.  Patient reports that she is now restarted Breztri.  She has stopped Trelegy Ellipta.  She reports that she is able to receive Breztri through her pharmacy for 90-day / 50-month supply for around $90.  She is not currently using a spacer with this.  She remains adherent to using her oxygen.  She reports she typically is on 4 L of O2 24/7.  We will discuss this today.  Patient is up-to-date with COVID-19 vaccinations with booster.  She is up-to-date with her flu vaccine.  Patient does have a pulse oximeter at home but she has not been routinely using it.  She is  aware that baseline O2 readings need to be above 88 to 90% on O2.  She does feel she is having some lower extremity swelling.  This is a chronic issue for the patient.  She reports adherence to HCTZ.  Last proBNP in September/2021 was stable.  Patient was originally scheduled to complete a video visit but she does not have a WebCam to be able to do this.  This was changed to a telephone visit.  She reports that she continues to be maintained on 10 mg of daily prednisone.  She did feel that this is helped her with her breathing and she "feels better" when taking the prednisone.    Observations/Objective:  03/01/2011-pulmonary function test-FVC 2.21 (68% predicted), postbronchodilator ratio 41, postbronchodilator FEV1 0.83 (33% predicted), no bronchodilator response, DLCO 9.28 (34% predicted)  02/21/2011-chest x-ray-COPD, no active cardiopulmonary disease  Social History   Tobacco Use  Smoking Status Former Smoker  . Packs/day: 1.00  . Years: 40.00  . Pack years: 40.00  . Types: Cigarettes  . Quit date: 09/23/2002  . Years since quitting: 18.0  Smokeless Tobacco Never Used   Immunization History  Administered Date(s) Administered  . Influenza Inj Mdck Quad With Preservative 06/29/2019  . Influenza Whole 07/05/2014  . Influenza, High Dose Seasonal PF 07/03/2015, 06/26/2016, 06/02/2017, 07/07/2018, 06/15/2020  . Influenza, Seasonal, Injecte, Preservative Fre 06/26/2015  . Influenza,inj,Quad PF,6+ Mos 06/23/2013, 06/26/2015  . Influenza-Unspecified 07/20/2014  . Moderna Sars-Covid-2 Vaccination 10/28/2019, 11/26/2019, 08/12/2020  . Pneumococcal Conjugate-13 03/30/2015, 10/10/2015  . Pneumococcal Polysaccharide-23 09/24/2011, 06/23/2012  . Tdap 09/07/2018  Assessment and Plan:  Chronic respiratory failure with hypoxia (HCC) Plan: Continue oxygen therapy Maintain oxygen saturations above 88 to 90% As discussed today would recommend at least 4 L of O2 at rest Would recommend  at least 6 L of O2 with physical exertion  COPD (chronic obstructive pulmonary disease) with emphysema Plan:  Continue Breztri  Stop Trelegy Ellipta Continue 10 mg of prednisone daily, could consider decreasing prednisone in the future Remain established with palliative care Continue oxygen therapy Follow-up with Dr. Delton Coombes in 2 to 3 months  Healthcare maintenance Up-to-date with flu vaccine Up-to-date with COVID-19 vaccinations   Follow Up Instructions:  Return in about 2 months (around 12/14/2020), or if symptoms worsen or fail to improve, for Follow up with Dr. Delton Coombes.   I discussed the assessment and treatment plan with the patient. The patient was provided an opportunity to ask questions and all were answered. The patient agreed with the plan and demonstrated an understanding of the instructions.   The patient was advised to call back or seek an in-person evaluation if the symptoms worsen or if the condition fails to improve as anticipated.  I provided 23 minutes of non-face-to-face time during this encounter.   Coral Ceo, NP

## 2020-10-27 ENCOUNTER — Other Ambulatory Visit: Payer: Medicare Other | Admitting: Nurse Practitioner

## 2020-10-27 ENCOUNTER — Other Ambulatory Visit: Payer: Self-pay

## 2020-10-27 DIAGNOSIS — J9611 Chronic respiratory failure with hypoxia: Secondary | ICD-10-CM

## 2020-10-27 DIAGNOSIS — Z515 Encounter for palliative care: Secondary | ICD-10-CM

## 2020-10-27 NOTE — Progress Notes (Signed)
[facility] Consult Note Telephone: [phone]  Fax: [phone]  PATIENT NAME: Danielle Harmon [id number] [phone] (home)  DOB: 1941/03/22 MRN: [id number]  PRIMARY CARE PROVIDER:    Chevis Pretty, [place],  [place] [address] [phone]  REFERRING PROVIDER:   Chevis Pretty, [place],  [place] [address] [phone]  RESPONSIBLE PARTY:   Extended Emergency Contact Information Primary Emergency Contact: Breelynn, [place] Phone: [phone] Relation: Son Secondary Emergency Contact: [place] of [place] Phone: [phone] Relation: Relative   Flonnie Overman (daughter) [phone]  I met face to face with patient in home.  ASSESSMENT AND RECOMMENDATIONS:   Advance Care Planning: Goal of care: Goal of care is comfort and to be safe in her home. Patient has a med alert device around her neck, report she has not had a need for it. Directives: Signed DNR and MOST form present in home and on [facility] EMR. Details of MOST form includes limited additional intervention, antibiotics if indicated, IV fluids for a defined trial period, feeding tube for defined trial period.   Symptom Management:  Chronic respiratory failure with hypoxia: Current COPD regimen includes Breztri 2 puffs by mouth twice a day, Albuterol as needed, and Prednisone. Patient on continuous oxygen supplementation at 4L. Patient report activity intolerance related to SOB on exertion due to disease progression. Patient denied fever, chills, or acute cough.  Recommendation: Patient is up to date with COVID-19 vaccinations with booster, flu vaccine is current. Continue current plan of care with adjustment as needed to meet goal of symptoms control thereby increasing quality of life. Prompt intervention if or when exacerbation  occurs. Advised to pace self during activity. High blood: BP 172/90, denied headache, denied dizziness Recommendation: check BP daily before lunch. May consider increasing Hctz to 42m if SBP consistently greater than 150.   Follow up Palliative Care Visit: Palliative care will continue to follow for goals of care clarification and symptom management. Return in about 4 weeks or prn.  Family /Caregiver/Community Supports: Patient lives at home by herself. She is a retired sEducation officer, museum She has three children who are very involved in her care, they visits regularly.   Cognitive / Functional decline: Patient awake, alert and coherent. She now ambulates within her home with a Rolator walker due to activity intolerance and gait instability. She uses wheelchair when outside home. No report of recent fall, she is continent of bowel and bladder.  I spent 48 minutes providing this consultation, time includes time spent with patient and on phone with family, chart review, provider coordination, and documentation. More than 50% of the time in this consultation was spent counseling and coordinating communication.   CHIEF COMPLAINT: activity intolerance   History obtained from review of EMR and discussion with patient. Records reviewed and summarized bellow.  HISTORY OF PRESENT ILLNESS:Laine Foleyis a 80y.o.year old femalewith multiple medical problems including COPD (emphysema), allergic rhinitis, GERD, HTN.Patient with end stage COPD oxygen dependent, currently on 4L supplemental oxygen. Palliative Care was asked to help  With symptoms management and complex decision making. This is a follow upvisit from12/17/2021.  CODE STATUS: DNR  PPS: 50%  HOSPICE ELIGIBILITY/DIAGNOSIS: TBD  PHYSICAL EXAM / ROS:   Vital signs: BP 172/90, P 89, RR 20, saturation 92% on 4L Current and past weights: 198lbs stable from last visit, BMI 32.09kg/m2 General:obese,frail appearing,siting in chair in  NAD Cardiovascular:deniedchest pain, 2+edema to bilateral lower legs Pulmonary: no acute cough, SOB with exertion, no adventitious lung sound auscultated Abdomen: appetite fair,deniedconstipation GU: denies dysuria MSK: no joint and ROM abnormalities, ambulatory Skin: no rashes or wounds reportedor noted on exposed skin Neurological: weakness, but otherwise non-focal Psych: non -anxious affect  PAST MEDICAL HISTORY:  Past Medical History:  Diagnosis Date  . COPD (chronic obstructive pulmonary disease) ([facility])   . GERD (gastroesophageal reflux disease)   . Hypertension     SOCIAL HX:  Social History   Tobacco Use  . Smoking status: Former Smoker    Packs/day: 1.00    Years: 40.00    Pack years: 40.00    Types: Cigarettes    Quit date: 09/23/2002    Years since quitting: 18.1  . Smokeless tobacco: Never Used  Substance Use Topics  . Alcohol use: Yes    Comment: occ   FAMILY HX:  Family History  Problem Relation Age of Onset  . Emphysema Father   . COPD Father   . Stroke Mother   . Diabetes Son     ALLERGIES:  Allergies  Allergen Reactions  . Bee Venom Anaphylaxis     PERTINENT MEDICATIONS:  Outpatient Encounter Medications as of 10/27/2020  Medication Sig  . albuterol (PROVENTIL) (2.5 MG/3ML) 0.083% nebulizer solution Take 3 mLs (2.5 mg total) by nebulization every 6 (six) hours as needed for wheezing or shortness of breath. Pt may use every 4 to 6 hours as needed  . albuterol (VENTOLIN HFA) 108 (90 Base) MCG/ACT inhaler Inhale 2 puffs into the lungs every 4 (four) hours as needed for wheezing or shortness of breath.  Marland Kitchen amLODipine (NORVASC) 5 MG tablet Take 1 tablet (5 mg total) by mouth daily.  Marland Kitchen BREZTRI AEROSPHERE 160-9-4.8 MCG/ACT AERO TAKE 2 PUFFS BY MOUTH TWICE A DAY  . Budeson-Glycopyrrol-Formoterol (BREZTRI AEROSPHERE) 160-9-4.8 MCG/ACT AERO Inhale 2 puffs into the lungs daily. >>> 2 puffs in the morning right when you wake up, rinse out your mouth after  use, 12 hours later 2 puffs, rinse after use >>> Take this daily, no matter what  . diclofenac Sodium (VOLTAREN) 1 % GEL Apply 4 g topically 4 (four) times daily.  Marland Kitchen guaiFENesin (MUCINEX) 600 MG 12 hr tablet Take 600 mg by mouth as needed.   . hydrochlorothiazide (HYDRODIURIL) 25 MG tablet Take 1 tablet (25 mg total) by mouth daily.  Marland Kitchen omeprazole (PRILOSEC) 20 MG capsule Take 1 capsule (20 mg total) by mouth daily.  . potassium chloride (KLOR-CON) 10 MEQ tablet Take 1 tablet (10 mEq total) by mouth 2 (two) times daily.  . predniSONE (DELTASONE) 10 MG tablet 65m daily for 5 days then 126mdaily chronically  . predniSONE (DELTASONE) 10 MG tablet TAKE 1TABLET BY MOUTH DAILY UNTIL OFFICE VISIT ON 10-12-20  . Spacer/Aero Chamber MoMarshall & Ilsleyse as directed  . triamcinolone (NASACORT) 55 MCG/ACT AERO nasal inhaler Place 2 sprays into the nose daily.   No facility-administered encounter medications on file as of 10/27/2020.    Thank you for the opportunity to participate in the care of Ms. BrBennett ScrapeThe palliative care team will continue to follow. Please call our office at [phone] we can be of additional assistance.  QuJari FavreDNP, AGPCNP-BC

## 2020-10-29 ENCOUNTER — Other Ambulatory Visit: Payer: Self-pay | Admitting: Emergency Medicine

## 2020-11-29 ENCOUNTER — Telehealth: Payer: Self-pay

## 2020-11-29 ENCOUNTER — Other Ambulatory Visit: Payer: Self-pay

## 2020-11-29 ENCOUNTER — Other Ambulatory Visit: Payer: Medicare Other | Admitting: Nurse Practitioner

## 2020-11-29 DIAGNOSIS — I10 Essential (primary) hypertension: Secondary | ICD-10-CM

## 2020-11-29 DIAGNOSIS — Z515 Encounter for palliative care: Secondary | ICD-10-CM

## 2020-11-29 MED ORDER — FUROSEMIDE 20 MG PO TABS
20.0000 mg | ORAL_TABLET | Freq: Every day | ORAL | 3 refills | Status: DC
Start: 2020-11-29 — End: 2021-01-12

## 2020-11-29 NOTE — Telephone Encounter (Signed)
Danielle Harmon a NP has been seeing patient through palliative care and visited with her today. She states that she has had a 5lb weight gain and her BP has been running in the 170 and 180s on the top. She has been keeping a log at home. She also states that her lower extremities are swollen. She is currently taking HCTZ 25mg  and wants to know if you think this should be increased or what do you suggest. Please call patient with instructions

## 2020-11-29 NOTE — Progress Notes (Signed)
[facility] Consult Note Telephone: [phone]  Fax: [phone]  PATIENT NAME: Danielle Harmon [id number] [phone] (home)  DOB: [birth year] MRN: [id number]  PRIMARY CARE PROVIDER:    Chevis Pretty, [place],  [place] [address] [phone]  REFERRING PROVIDER:   Chevis Pretty, [place],  [place] [address] [phone]  RESPONSIBLE PARTY:   Extended Emergency Contact Information Primary Emergency Contact: [place] of [place] Phone: [phone] Relation: Son Secondary Emergency Contact: [place] of [place] Phone: [phone] Relation: Relative  I met face to face with patient in home.  ASSESSMENT AND RECOMMENDATIONS:   Advance Care Planning: Patient's goal of care is comfort while preserving function. Patient desires to remain in her home till end of life. Signed DNR and MOST form present in home and on [facility] EMR. Details of MOST formincludes limited additional intervention, antibiotics if indicated, IV fluids for a defined trial period, feeding tube for defined trial period.   Symptom Management:  High blood pressure: BP elevated on exam today, same for last visit. At last visit patient was advised to keep log of blood pressure readings after medication administration.  BP using patient's auto cuff today was 160/99, manual check 166/92 in right arm and 164/90 in left arm, HR 76. SBP range in the last 2 weeks per patient's log is 117-183, with average of 152. Patient currently on Amlodipine 55m and HCTZ 263m endorsed compliance with medication. Patient also noted with +3 bilateral lower extremity edema and a 5lb weight gain in the last month. Patient report increased activity intolerance with significant dyspnea with any activity. No report of fever, chills or acute cough, lung sound  without crackles. Recommendation: recommend increasing HCTZ from 2536mo 44m29mecommend consistent use of rescue inhaler Albuterol for SOB in addition to her scheduled inhaler. Her PCP office called with visit findings and recommendation. Palliative care will continue to provided support to patient, family and medical team. Provided general support and encouragement, no other unmet needs identified at this time.  Follow up Palliative Care Visit: Palliative care will continue to follow for complex decision making and symptom management. Return in about 4 weeks or prn.  Family /Caregiver/Community Supports: Patient lives at home alone. Her three children visits regularly.  Cognitive / Functional decline: Patient awake, alert and coherent. Verbalized increased dyspnea with activity, now ambulates regularly within her home with a Rolator walker, uses wheelchair when outside home. No report of recent fall, she is continent of bowel and bladder. Patient has a med alert device around her neck, report she has not had a need for it.  I spent 40 minutes providing this consultation, time includes time spent with patient, chart review, provider coordination, and documentation. More than 50% of the time in this consultation was spent counseling and coordinating communication.   CHIEF COMPLAINT: High blood presure  History obtained from review of EMR and discussion with patient. Records reviewed and summarized bellow.  HISTORY OF PRESENT ILLNESS:Danielle Foleyis a 79 y24.year old femalewith multiple medical problems including COPD (emphysema), allergic rhinitis, GERD, HTN.Patient with end stage COPD oxygen dependent, currently uses 4-5L supplemental oxygen.Palliative Care was asked to help with symptoms management and complex decision making.This is a follow upvisit from02/12/2020.  CODE STATUS: DNR  PPS: 50%  HOSPICE ELIGIBILITY/DIAGNOSIS: TBD  ROS   Constitutional: denies fever, denies  chills, denies fatigue EYES: denies acute  vision changes, wears glasses at times ENMT: denies dysphagia Cardiovascular: denies chest pain, denies palpitation Pulmonary: denies cough, denies increased SOB at rest Abdomen: endorses fair appetite, denies constipation, denies incontinence of bowel GU: denies dysuria, denies incontinence of urine MSK:  endorses ROM limitations, no falls reported Skin: denies rashes or wounds Neurological: endorses weakness, denies uncontrolled pain, denies insomnia Psych: Endorses positive mood Heme/lymph/immuno: denies bruises, abnormal bleeding   Physical Exam: Vital Signs: BP 166/92 (R arm), 164/90 (L arm), P76, 99% on 5L oxygen Current and past weights: 203lbs up from 198lbs last month General: frail appearing, obese EYES: anicteric sclera, lids intact, no discharge  ENMT: intact hearing, oral mucous membranes moist CV:  +3 BLE edema Pulmonary:  increased work of breathing, no cough, no audible wheezes, no crackles Abomen:  no ascites GU: deferred MSK: Reduced ROM, no contractures of LE,  ambulatory Skin: warm and dry, no rashes or wounds on visible skin Neuro: weakness, otherwise non-focal Psych: non-anxious affect today, A and O x 4 Hem/lymph/immuno: no widespread bruising   PAST MEDICAL HISTORY:  Past Medical History:  Diagnosis Date  . COPD (chronic obstructive pulmonary disease) ([facility])   . GERD (gastroesophageal reflux disease)   . Hypertension     SOCIAL HX:  Social History   Tobacco Use  . Smoking status: Former Smoker    Packs/day: 1.00    Years: 40.00    Pack years: 40.00    Types: Cigarettes    Quit date: 09/23/2002    Years since quitting: 18.1  . Smokeless tobacco: Never Used  Substance Use Topics  . Alcohol use: Yes    Comment: occ   FAMILY HX:  Family History  Problem Relation Age of Onset  . Emphysema Father   . COPD Father   . Stroke Mother   . Diabetes Son     ALLERGIES:  Allergies  Allergen Reactions  .  Bee Venom Anaphylaxis     PERTINENT MEDICATIONS:  Outpatient Encounter Medications as of 11/29/2020  Medication Sig  . albuterol (PROVENTIL) (2.5 MG/3ML) 0.083% nebulizer solution Take 3 mLs (2.5 mg total) by nebulization every 6 (six) hours as needed for wheezing or shortness of breath. Pt may use every 4 to 6 hours as needed  . albuterol (VENTOLIN HFA) 108 (90 Base) MCG/ACT inhaler Inhale 2 puffs into the lungs every 4 (four) hours as needed for wheezing or shortness of breath.  Marland Kitchen amLODipine (NORVASC) 5 MG tablet Take 1 tablet (5 mg total) by mouth daily.  Marland Kitchen BREZTRI AEROSPHERE 160-9-4.8 MCG/ACT AERO TAKE 2 PUFFS BY MOUTH TWICE A DAY  . Budeson-Glycopyrrol-Formoterol (BREZTRI AEROSPHERE) 160-9-4.8 MCG/ACT AERO Inhale 2 puffs into the lungs daily. >>> 2 puffs in the morning right when you wake up, rinse out your mouth after use, 12 hours later 2 puffs, rinse after use >>> Take this daily, no matter what  . diclofenac Sodium (VOLTAREN) 1 % GEL Apply 4 g topically 4 (four) times daily.  Marland Kitchen guaiFENesin (MUCINEX) 600 MG 12 hr tablet Take 600 mg by mouth as needed.   . hydrochlorothiazide (HYDRODIURIL) 25 MG tablet Take 1 tablet (25 mg total) by mouth daily.  Marland Kitchen omeprazole (PRILOSEC) 20 MG capsule Take 1 capsule (20 mg total) by mouth daily.  . potassium chloride (KLOR-CON) 10 MEQ tablet Take 1 tablet (10 mEq total) by mouth 2 (two) times daily.  . predniSONE (DELTASONE) 10 MG tablet 65m daily for 5 days then 122mdaily chronically  . predniSONE (DELTASONE) 10 MG tablet TAKE  1 TABLET BY MOUTH DAILY.  Marland Kitchen Spacer/Aero Chamber [employer] Use as directed  . triamcinolone (NASACORT) 55 MCG/ACT AERO nasal inhaler Place 2 sprays into the nose daily.   No facility-administered encounter medications on file as of 11/29/2020.    Thank you for the opportunity to participate in the care of Ms. Court Joy. The palliative care team will continue to follow. Please call our office at [phone] if we can be of  additional assistance.  Jari Favre, DNP, AGPCNP-BC

## 2020-11-29 NOTE — Telephone Encounter (Signed)
Called patient and discussed changing her HCTZ to lasix 20mg  1 po daily.  She will continue HCTz daily until she can get someone to pick up lasix at pharmacy. She knows if she takes HVTZ not to start lasix until next day.

## 2021-01-02 ENCOUNTER — Other Ambulatory Visit: Payer: Medicare Other | Admitting: Nurse Practitioner

## 2021-01-02 ENCOUNTER — Other Ambulatory Visit: Payer: Self-pay

## 2021-01-02 DIAGNOSIS — Z515 Encounter for palliative care: Secondary | ICD-10-CM

## 2021-01-02 DIAGNOSIS — I10 Essential (primary) hypertension: Secondary | ICD-10-CM

## 2021-01-02 NOTE — Progress Notes (Signed)
[facility] Consult Note Telephone: [phone]  Fax: [phone]    Date of encounter: 01/02/21 PATIENT NAME: Danielle Harmon [id number]   [phone] (home)  DOB: 1941/03/20 MRN: [id number] PRIMARY CARE PROVIDER:    Chevis Harmon, [place],  [place] [address] [phone]  REFERRING PROVIDER:   Chevis Harmon, [place],  [place] [address] [phone]  RESPONSIBLE PARTY:    Contact Information    Name Relation Home Work Mobile   Danielle Harmon [phone]     [place] Relative [phone]        I met face to face with patient in home. Palliative Care was asked to follow this patient by consultation request of  Danielle Harmon, * to help  address advance care planning and complex medical decision making. This is a follow up visit form 11/29/2020.                                   ASSESSMENT AND PLAN / RECOMMENDATIONS:   Advance Care Planning/Goals of Care:   Goal of Care: Patient's goal of care is function. Her goal is to remain in her home till end of life. Directives: Signed DNR and MOST forms present in home, copy on [facility] EMR. Details of MOST formincludes limited additional intervention, antibiotics if indicated, IV fluids for a defined trial period, feeding tube for defined trial period.   Symptom Management/Plan: High blood pressure: SBP range in the last month 143- 173, average 159. Patient has up coming appointment next week with her PCP. Patient advised to continue daily blood pressure check. Continue Amlodipine 52m and Furosemide 229mdaily. Patient instructed to push her med alert button if in distress, or chest pain. Message with visit findings sent to her PCP via Epic. Bilateral lower extremity edema: continue Furosemide 2090maily. Continue use of compression stockings. Ecouraged to elevate legs when sitting  for prolong period of time.  SOB with activity: had to stop to catch her breath walking from her living room to her kitchen, oxygen saturation with activity was 93% on 6L. Patient denied chest pain, denied acute cough or hemoptysis. Encouraged to pace self during activity, stop and rest as needed. Notify PCP if worsening symptoms. Patient verbalized understanding. Questions and concerns were addressed. Patient was encouraged to call with questions and/or concerns, patient has by contact information.  Follow up Palliative Care Visit: Palliative care will continue to follow for complex medical decision making, advance care planning, and clarification of goals of care. Return in about 4 weeks or prn.  PPS: 50% weak  HOSPICE ELIGIBILITY/DIAGNOSIS: yes/end stage COPD, patient however verbalized unreadiness for Hospice service.   Chief Complaint: Follow up visit/ high bloodpressure  HISTORY OF PRESENT ILLNESS:  Danielle Harmon a [age]o. year old female with multiple medical problems including Hypertension, blood pressure has been elevated in the last one month, condition associated with worsening bilateral lower extremity edema and weight gain. At last visit a month ago her PCP was informed of her elevated blood presure readings and weight gain. She was on Amlodipine 5mg6mily and HCTZ 25mg73mly, HCTZ was discontinued and patient was placed on Furosemide 20mg 11my. Today at visit, blood pressure continues to be elevated, she however had a 6lbs weight los from last month. She denied chest pain, denied palpitation, denied fever or chills. She  endorsed SOB even with slight activity. All 10 point systems reviewed and are negative except as documented in history of present illness above. Her medical condition include end stage COPD on supplemental oxygen dependent.   History obtained from review of EMR and discussion with patient. I reviewed available labs, medications, imaging, studies and related documents  from [facility] EMR.  Records reviewed and summarized here.   Reviewed lab 07/14/2021 Cr 0.81, GFR 69, Albumin 7.0 Reviewed chest x-ray on 04/06/2020 IMPRESSION: No active cardiopulmonary disease.  Physical Exam: Vital Signs: BP 168/100, HR 82, 100% on 6L oxygen. Current and past weights:197bs down from 203lbs last month General: frail appearing, obese EYES: anicteric sclera, no discharge  ENMT: intact hearing, oral mucous membranes moist CV: +3 BLE edema Pulmonary: no increased work of breathing at rest, no cough, no audible wheezes, no crackles Abomen: no ascites GU: deferred EAK:LTYVDPB ROM, no contractures,  ambulatory Skin: warm and dry, no rashes or wounds on visible skin Neuro: weakness, otherwise non-focal Psych: non-anxious affecttoday, A and O x 4 Hem/lymph/immuno: no widespread bruising  Thank you for the opportunity to participate in the care of Danielle Harmon.  The palliative care team will continue to follow. Please call our office at [phone] if we can be of additional assistance.   This visit was coded based on medical decision making (MDM).  Danielle Favre, DNP, AGPCNP-BC   COVID-19 PATIENT SCREENING TOOL Asked and negative response unless otherwise noted:   Have you had symptoms of covid, tested positive or been in contact with someone with symptoms/positive test in the past 5-10 days?  NO

## 2021-01-12 ENCOUNTER — Ambulatory Visit (INDEPENDENT_AMBULATORY_CARE_PROVIDER_SITE_OTHER): Payer: Medicare Other | Admitting: Nurse Practitioner

## 2021-01-12 ENCOUNTER — Encounter: Payer: Self-pay | Admitting: Nurse Practitioner

## 2021-01-12 ENCOUNTER — Telehealth: Payer: Self-pay | Admitting: Emergency Medicine

## 2021-01-12 ENCOUNTER — Other Ambulatory Visit: Payer: Self-pay

## 2021-01-12 VITALS — BP 162/79 | HR 85 | Temp 96.7°F | Resp 20

## 2021-01-12 DIAGNOSIS — R609 Edema, unspecified: Secondary | ICD-10-CM | POA: Insufficient documentation

## 2021-01-12 DIAGNOSIS — J9611 Chronic respiratory failure with hypoxia: Secondary | ICD-10-CM | POA: Diagnosis not present

## 2021-01-12 DIAGNOSIS — R600 Localized edema: Secondary | ICD-10-CM | POA: Insufficient documentation

## 2021-01-12 DIAGNOSIS — K219 Gastro-esophageal reflux disease without esophagitis: Secondary | ICD-10-CM | POA: Diagnosis not present

## 2021-01-12 DIAGNOSIS — Z6834 Body mass index (BMI) 34.0-34.9, adult: Secondary | ICD-10-CM

## 2021-01-12 DIAGNOSIS — I10 Essential (primary) hypertension: Secondary | ICD-10-CM | POA: Diagnosis not present

## 2021-01-12 DIAGNOSIS — J301 Allergic rhinitis due to pollen: Secondary | ICD-10-CM

## 2021-01-12 DIAGNOSIS — J438 Other emphysema: Secondary | ICD-10-CM | POA: Diagnosis not present

## 2021-01-12 MED ORDER — FUROSEMIDE 20 MG PO TABS: 20.0000 mg | ORAL_TABLET | Freq: Every day | ORAL | 3 refills | Status: DC

## 2021-01-12 MED ORDER — AMLODIPINE BESYLATE 5 MG PO TABS: 5.0000 mg | ORAL_TABLET | Freq: Every day | ORAL | 1 refills | Status: DC

## 2021-01-12 MED ORDER — POTASSIUM CHLORIDE ER 10 MEQ PO TBCR: 10.0000 meq | EXTENDED_RELEASE_TABLET | Freq: Two times a day (BID) | ORAL | 1 refills | Status: DC

## 2021-01-12 MED ORDER — OMEPRAZOLE 20 MG PO CPDR: 20.0000 mg | DELAYED_RELEASE_CAPSULE | Freq: Every day | ORAL | 1 refills | Status: DC

## 2021-01-12 NOTE — Telephone Encounter (Signed)
I don't recommend any changes right at this time. I will let Dr. Delton Coombes address. If her breathing is stable she can try decreasing prednisone to 5mg  and see how she does

## 2021-01-12 NOTE — Telephone Encounter (Signed)
Dr. Delton Coombes patient last seen Arlys John 1/24/222 for emphysema. Pending OV for 02/08/2021.  Spoke to patient, who reports of bilateral foot/ankle edema. Edema has been present for a long time per patient.  She is concerned that prednisone is causing edema. She is on maintenance dose of prednisone at 10mg  daily.  Patient denied fever, chills, sweats, cough, wheezing or sob.  Beth, please advise. Dr. is unavailable.

## 2021-01-12 NOTE — Telephone Encounter (Signed)
Spoke to patient and relayed below recommendations.  She stated that she will try decreasing to 5mg  of prednisone, as her is stable. She will cut her 10mg  tablet in half. Nothing further needed at this time.

## 2021-01-12 NOTE — Patient Instructions (Signed)
Fall Prevention in the Home, Adult Falls can cause injuries and can happen to people of all ages. There are many things you can do to make your home safe and to help prevent falls. Ask for help when making these changes. What actions can I take to prevent falls? General Instructions  Use good lighting in all rooms. Replace any light bulbs that burn out.  Turn on the lights in dark areas. Use night-lights.  Keep items that you use often in easy-to-reach places. Lower the shelves around your home if needed.  Set up your furniture so you have a clear path. Avoid moving your furniture around.  Do not have throw rugs or other things on the floor that can make you trip.  Avoid walking on wet floors.  If any of your floors are uneven, fix them.  Add color or contrast paint or tape to clearly mark and help you see: ? Grab bars or handrails. ? First and last steps of staircases. ? Where the edge of each step is.  If you use a stepladder: ? Make sure that it is fully opened. Do not climb a closed stepladder. ? Make sure the sides of the stepladder are locked in place. ? Ask someone to hold the stepladder while you use it.  Know where your pets are when moving through your home. What can I do in the bathroom?  Keep the floor dry. Clean up any water on the floor right away.  Remove soap buildup in the tub or shower.  Use nonskid mats or decals on the floor of the tub or shower.  Attach bath mats securely with double-sided, nonslip rug tape.  If you need to sit down in the shower, use a plastic, nonslip stool.  Install grab bars by the toilet and in the tub and shower. Do not use towel bars as grab bars.      What can I do in the bedroom?  Make sure that you have a light by your bed that is easy to reach.  Do not use any sheets or blankets for your bed that hang to the floor.  Have a firm chair with side arms that you can use for support when you get dressed. What can I do in  the kitchen?  Clean up any spills right away.  If you need to reach something above you, use a step stool with a grab bar.  Keep electrical cords out of the way.  Do not use floor polish or wax that makes floors slippery. What can I do with my stairs?  Do not leave any items on the stairs.  Make sure that you have a light switch at the top and the bottom of the stairs.  Make sure that there are handrails on both sides of the stairs. Fix handrails that are broken or loose.  Install nonslip stair treads on all your stairs.  Avoid having throw rugs at the top or bottom of the stairs.  Choose a carpet that does not hide the edge of the steps on the stairs.  Check carpeting to make sure that it is firmly attached to the stairs. Fix carpet that is loose or worn. What can I do on the outside of my home?  Use bright outdoor lighting.  Fix the edges of walkways and driveways and fix any cracks.  Remove anything that might make you trip as you walk through a door, such as a raised step or threshold.  Trim any   bushes or trees on paths to your home.  Check to see if handrails are loose or broken and that both sides of all steps have handrails.  Install guardrails along the edges of any raised decks and porches.  Clear paths of anything that can make you trip, such as tools or rocks.  Have leaves, snow, or ice cleared regularly.  Use sand or salt on paths during winter.  Clean up any spills in your garage right away. This includes grease or oil spills. What other actions can I take?  Wear shoes that: ? Have a low heel. Do not wear high heels. ? Have rubber bottoms. ? Feel good on your feet and fit well. ? Are closed at the toe. Do not wear open-toe sandals.  Use tools that help you move around if needed. These include: ? Canes. ? Walkers. ? Scooters. ? Crutches.  Review your medicines with your doctor. Some medicines can make you feel dizzy. This can increase your chance  of falling. Ask your doctor what else you can do to help prevent falls. Where to find more information  Centers for Disease Control and Prevention, [employer]: www.cdc.gov  National Institute on Aging: www.nia.nih.gov Contact a doctor if:  You are afraid of falling at home.  You feel weak, drowsy, or dizzy at home.  You fall at home. Summary  There are many simple things that you can do to make your home safe and to help prevent falls.  Ways to make your home safe include removing things that can make you trip and installing grab bars in the bathroom.  Ask for help when making these changes in your home. This information is not intended to replace advice given to you by your health care provider. Make sure you discuss any questions you have with your health care provider. Document Revised: 04/12/2020 Document Reviewed: 04/12/2020 Elsevier Patient Education  2021 Elsevier Inc.  

## 2021-01-12 NOTE — Progress Notes (Signed)
Subjective:    Patient ID: Danielle Harmon, female    DOB: 04/26/1941, 80 y.o.   MRN: [id number]   Chief Complaint: medical management of chronic issues     HPI:  1. Primary hypertension No c/o chest pain,  or headache. Does not check blood pressure at home. BP Readings from Last 3 Encounters:  01/12/21 (!) 162/79  09/07/20 136/72  07/14/20 136/82     2. Other emphysema ([place]) She has had sob for years. No worse then usual  3. Chronic respiratory failure with hypoxia (HCC) Is on oxygen at home at home 24/7. Pulmonologist ut her on prednisone 61m daily at last visit. She has not follow up with them  4. Gastro-esophageal reflux disease without esophagitis Is on omeprazole daily and works well to keep symptoms under control  5. Allergic rhinitis due to pollen, unspecified seasonality nasacort working well.  6. Hypokalemia Is on potassium supplement daily. Lab Results  Component Value Date   K 4.0 07/14/2020      7. BMI 34.0-34.9,adult No recent weight changes Wt Readings from Last 3 Encounters:  09/07/20 198 lb 12.8 oz (90.2 kg)  06/15/20 201 lb 6.4 oz (91.4 kg)  04/06/20 199 lb (90.3 kg)   BMI Readings from Last 3 Encounters:  09/07/20 32.09 kg/m  07/14/20 34.57 kg/m  06/15/20 34.57 kg/m       Outpatient Encounter Medications as of 01/12/2021  Medication Sig  . albuterol (PROVENTIL) (2.5 MG/3ML) 0.083% nebulizer solution Take 3 mLs (2.5 mg total) by nebulization every 6 (six) hours as needed for wheezing or shortness of breath. Pt may use every 4 to 6 hours as needed  . albuterol (VENTOLIN HFA) 108 (90 Base) MCG/ACT inhaler Inhale 2 puffs into the lungs every 4 (four) hours as needed for wheezing or shortness of breath.  .Marland KitchenamLODipine (NORVASC) 5 MG tablet Take 1 tablet (5 mg total) by mouth daily.  .Marland KitchenBREZTRI AEROSPHERE 160-9-4.8 MCG/ACT AERO TAKE 2 PUFFS BY MOUTH TWICE A DAY  . Budeson-Glycopyrrol-Formoterol (BREZTRI AEROSPHERE) 160-9-4.8 MCG/ACT AERO  Inhale 2 puffs into the lungs daily. >>> 2 puffs in the morning right when you wake up, rinse out your mouth after use, 12 hours later 2 puffs, rinse after use >>> Take this daily, no matter what  . diclofenac Sodium (VOLTAREN) 1 % GEL Apply 4 g topically 4 (four) times daily.  . furosemide (LASIX) 20 MG tablet Take 1 tablet (20 mg total) by mouth daily.  .Marland KitchenguaiFENesin (MUCINEX) 600 MG 12 hr tablet Take 600 mg by mouth as needed.   .Marland Kitchenomeprazole (PRILOSEC) 20 MG capsule Take 1 capsule (20 mg total) by mouth daily.  . potassium chloride (KLOR-CON) 10 MEQ tablet Take 1 tablet (10 mEq total) by mouth 2 (two) times daily.  .Marland KitchenSpacer/Aero Chamber MMarshall & IlsleyUse as directed  . triamcinolone (NASACORT) 55 MCG/ACT AERO nasal inhaler Place 2 sprays into the nose daily.   No facility-administered encounter medications on file as of 01/12/2021.    Past Surgical History:  Procedure Laterality Date  . CHOLECYSTECTOMY    . CYSTECTOMY     benign cysts removed from head and R hand  . KNEE ARTHROSCOPY     Left    Family History  Problem Relation Age of Onset  . Emphysema Father   . COPD Father   . Stroke Mother   . Diabetes Son     New complaints: *none today  Social history: Lives by herself. Family checks on her daily  Controlled substance contract: n/a    Review of Systems  Constitutional: Negative for diaphoresis.  Eyes: Negative for pain.  Respiratory: Negative for shortness of breath.   Cardiovascular: Negative for chest pain, palpitations and leg swelling.  Gastrointestinal: Negative for abdominal pain.  Endocrine: Negative for polydipsia.  Skin: Negative for rash.  Neurological: Negative for dizziness, weakness and headaches.  Hematological: Does not bruise/bleed easily.  All other systems reviewed and are negative.      Objective:   Physical Exam Vitals and nursing note reviewed.  Constitutional:      General: She is not in acute distress.    Appearance: Normal  appearance. She is well-developed.  HENT:     Head: Normocephalic.     Nose: Nose normal.  Eyes:     Pupils: Pupils are equal, round, and reactive to light.  Neck:     Vascular: No carotid bruit or JVD.  Cardiovascular:     Rate and Rhythm: Normal rate and regular rhythm.     Heart sounds: Normal heart sounds.  Pulmonary:     Effort: Pulmonary effort is normal. No respiratory distress.     Breath sounds: Wheezing present. No rales.  Chest:     Chest wall: No tenderness.  Abdominal:     General: Bowel sounds are normal. There is no distension or abdominal bruit.     Palpations: Abdomen is soft. There is no hepatomegaly, splenomegaly, mass or pulsatile mass.     Tenderness: There is no abdominal tenderness.  Musculoskeletal:        General: Normal range of motion.     Cervical back: Normal range of motion and neck supple.     Right lower leg: Edema (2+) present.     Left lower leg: Edema (2+) present.  Lymphadenopathy:     Cervical: No cervical adenopathy.  Skin:    General: Skin is warm and dry.  Neurological:     Mental Status: She is alert and oriented to person, place, and time.     Deep Tendon Reflexes: Reflexes are normal and symmetric.  Psychiatric:        Behavior: Behavior normal.        Thought Content: Thought content normal.        Judgment: Judgment normal.     BP (!) 162/79   Pulse 85   Temp (!) 96.7 F (35.9 C) (Temporal)   Resp 20   SpO2 96% Comment: 5 Liters of O2       Assessment & Plan:  Danielle Harmon comes in today with chief complaint of Medical Management of Chronic Issues   Diagnosis and orders addressed:  1. Primary hypertension Low sodium diet - amLODipine (NORVASC) 5 MG tablet; Take 1 tablet (5 mg total) by mouth daily.  Dispense: 90 tablet; Refill: 1 - potassium chloride (KLOR-CON) 10 MEQ tablet; Take 1 tablet (10 mEq total) by mouth 2 (two) times daily.  Dispense: 180 tablet; Refill: 1 - CBC with Differential/Platelet -  CMP14+EGFR - Lipid panel  2. Other emphysema ([place]) Need to follow up with pulmonology - predniSONE (DELTASONE) 10 MG tablet; Take 10 mg by mouth daily.  3. Chronic respiratory failure with hypoxia (HCC) Continue oxygen as prescribed  4. Gastro-esophageal reflux disease without esophagitis Avoid spicy foods Do not eat 2 hours prior to bedtime - omeprazole (PRILOSEC) 20 MG capsule; Take 1 capsule (20 mg total) by mouth daily.  Dispense: 90 capsule; Refill: 1  5. Allergic rhinitis due to pollen, unspecified seasonality  6. BMI 34.0-34.9,adult Discussed diet and exercise for person with BMI >25 Will recheck weight in 3-6 months  7. Peripheral edema elevate legs when sitting Is going to try to wean down on prednisone- will discuss with pulmonology - furosemide (LASIX) 20 MG tablet; Take 1 tablet (20 mg total) by mouth daily.  Dispense: 30 tablet; Refill: 3   Labs pending Health Maintenance reviewed Diet and exercise encouraged  Follow up plan: 6 months   Mary-Margaret Hassell Done, FNP

## 2021-01-13 LAB — CMP14+EGFR
ALT: 16 IU/L (ref 0–32)
AST: 16 IU/L (ref 0–40)
Albumin/Globulin Ratio: 1.9 (ref 1.2–2.2)
Albumin: 4.2 g/dL (ref 3.7–4.7)
Alkaline Phosphatase: 75 IU/L (ref 44–121)
BUN/Creatinine Ratio: 24 (ref 12–28)
BUN: 20 mg/dL (ref 8–27)
Bilirubin Total: 0.4 mg/dL (ref 0.0–1.2)
CO2: 33 mmol/L — ABNORMAL HIGH (ref 20–29)
Calcium: 9.1 mg/dL (ref 8.7–10.3)
Chloride: 93 mmol/L — ABNORMAL LOW (ref 96–106)
Creatinine, Ser: 0.83 mg/dL (ref 0.57–1.00)
Globulin, Total: 2.2 g/dL (ref 1.5–4.5)
Glucose: 119 mg/dL — ABNORMAL HIGH (ref 65–99)
Potassium: 4.3 mmol/L (ref 3.5–5.2)
Sodium: 140 mmol/L (ref 134–144)
Total Protein: 6.4 g/dL (ref 6.0–8.5)
eGFR: 71 mL/min/{1.73_m2} (ref >59)

## 2021-01-13 LAB — CBC WITH DIFFERENTIAL/PLATELET
Basophils Absolute: 0.1 10*3/uL (ref 0.0–0.2)
Basos: 1 %
EOS (ABSOLUTE): 0 10*3/uL (ref 0.0–0.4)
Eos: 0 %
Hematocrit: 42.2 % (ref 34.0–46.6)
Hemoglobin: 13.8 g/dL (ref 11.1–15.9)
Immature Grans (Abs): 0.1 10*3/uL (ref 0.0–0.1)
Immature Granulocytes: 1 %
Lymphocytes Absolute: 0.7 10*3/uL (ref 0.7–3.1)
Lymphs: 7 %
MCH: 29.7 pg (ref 26.6–33.0)
MCHC: 32.7 g/dL (ref 31.5–35.7)
MCV: 91 fL (ref 79–97)
Monocytes Absolute: 0.5 10*3/uL (ref 0.1–0.9)
Monocytes: 6 %
Neutrophils Absolute: 8.3 10*3/uL — ABNORMAL HIGH (ref 1.4–7.0)
Neutrophils: 85 %
Platelets: 231 10*3/uL (ref 150–450)
RBC: 4.64 x10E6/uL (ref 3.77–5.28)
RDW: 12.5 % (ref 11.7–15.4)
WBC: 9.7 10*3/uL (ref 3.4–10.8)

## 2021-01-13 LAB — LIPID PANEL
Chol/HDL Ratio: 2 ratio (ref 0.0–4.4)
Cholesterol, Total: 214 mg/dL — ABNORMAL HIGH (ref 100–199)
HDL: 109 mg/dL (ref >39)
LDL Chol Calc (NIH): 94 mg/dL (ref 0–99)
Triglycerides: 64 mg/dL (ref 0–149)
VLDL Cholesterol Cal: 11 mg/dL (ref 5–40)

## 2021-01-20 ENCOUNTER — Other Ambulatory Visit: Payer: Self-pay | Admitting: Nurse Practitioner

## 2021-01-20 DIAGNOSIS — I10 Essential (primary) hypertension: Secondary | ICD-10-CM

## 2021-02-07 ENCOUNTER — Other Ambulatory Visit: Payer: Self-pay

## 2021-02-07 ENCOUNTER — Other Ambulatory Visit: Payer: Medicare Other | Admitting: Nurse Practitioner

## 2021-02-07 VITALS — BP 158/70 | HR 81 | Resp 18

## 2021-02-07 DIAGNOSIS — Z515 Encounter for palliative care: Secondary | ICD-10-CM

## 2021-02-07 DIAGNOSIS — R52 Pain, unspecified: Secondary | ICD-10-CM

## 2021-02-07 DIAGNOSIS — R600 Localized edema: Secondary | ICD-10-CM

## 2021-02-07 NOTE — Progress Notes (Signed)
[facility] Consult Note Telephone: [phone]  Fax: [phone]    Date of encounter: 02/07/21 PATIENT NAME: Danielle Harmon [place] [address]   [id number] (home)  DOB: [birth year] MRN: [id number]  PRIMARY CARE PROVIDER:    Chevis Pretty, [address],  [place] [address] [phone]  REFERRING PROVIDER:   Chevis Pretty, [address] [place],  [place] [address] [phone]  RESPONSIBLE PARTY:    Contact Information    Name Relation Home Work Mobile   Danielle Harmon, Lumb [phone]     Pinch Relative [phone]       I met face to face with patient in home. Palliative Care was asked to follow this patient by consultation request of  Danielle Harmon, Danielle Harmon, * to address advance care planning and complex medical decision making. This is a follow up visit.                                 ASSESSMENT AND PLAN / RECOMMENDATIONS:   Advance Care Planning/Goals of Care:  CODE STATUS: DNR Goal of Care: Patient's goal of care is function. Her goal is to remain in her home till end of life. Directives: Signed DNR and MOST forms present in home, copy on [facility] EMR. Details of MOST forminclude limited additional intervention, antibiotics if indicated, IV fluids for a defined trial period, feeding tube for defined trial period.   Symptom Management/Plan: Bilateral lower extremity edema: ongoing and worsening with weeping noted to right lower leg. Prednisone decreased from 47m to 5 mg with no improvement in symptom. Patient instructed today to increase current dose of Furosemide from 225mto 4075maily for 4 days. Patient to continue monitoring her blood pressure daily. Encouraged consistent use of compression stocks and elevation of legs when sitting. Wound to right lower leg: Bruises from cat scratch. No reddness or swelling to site, patient denied pain to site.  Encouraged to keep area clean and dry. Right write wrist pain: FROM noted to right wrist joint without swelling or redness. Patient denied trauma to site. Patient report pain is relieved with application of Voltaren gel. Advised to continue application of Voltaren gel to site as needed.  Provided general support and encouragement. Questions and concerns were addressed. Patient was encouraged to call with questions and/or concerns.  Follow up Palliative Care Visit: Palliative care will continue to follow for complex medical decision making, advance care planning, and clarification of goals. Return in about 4 weeks or prn.  PPS: 50%  HOSPICE ELIGIBILITY/DIAGNOSIS: TBD  CHIEF COMPLAIN: bilateral lower extremity edema  History obtained from review of Epic EMR and discussion with Danielle Harmon.   HISTORY OF PRESENT ILLNESS:  Danielle Harmon a 80 56o. year old female  with ongoing +3 pitting bilateral lower extremity edema. Condition is ongoing in the last 2 months with no improvement with Furosemide 13m76mily. Left lower leg noted to be weeping from the site of skin bruise from patient's cat scratch. Edema not associated with respiratory distress. Patient denied new cough, denied wheezing, denied shortness of breath at rest. Endorsed shortness of breath with activity. Ten systems reviewed and are negative for acute change, except as noted in the HPI.   Component Ref Range & Units 3 wk ago  (01/12/21) 6 mo ago  (07/14/20) 7 mo ago  (06/15/20) 1 yr ago  (01/03/20) 1 yr ago  (  05/05/19)  Glucose 65 - 99 mg/dL 119High  101High  118High R  92  92   BUN 8 - 27 mg/dL 20  12  13  R  10  12   Creatinine, Ser 0.57 - 1.00 mg/dL 0.83  0.81  0.76 R  0.75  0.79   eGFR >59 mL/min/1.73 71       BUN/Creatinine Ratio 12 - 28 24  15   13  15    Sodium 134 - 144 mmol/L 140  136  136 R  137  137   Potassium 3.5 - 5.2 mmol/L 4.3  4.0  3.7 R  4.0  4.0   Chloride 96 - 106 mmol/L 93Low  92Low  93Low R  92Low   93Low   CO2 20 - 29 mmol/L 33High  32High  38High R  28  29   Calcium 8.7 - 10.3 mg/dL 9.1  9.5  9.6 R  9.7  9.5   Total Protein 6.0 - 8.5 g/dL 6.4  7.0  7.1 R  6.6  6.7   Albumin 3.7 - 4.7 g/dL 4.2  4.3  3.9 R  4.1  4.2   Globulin, Total 1.5 - 4.5 g/dL 2.2  2.7   2.5  2.5   Albumin/Globulin Ratio 1.2 - 2.2 1.9  1.6   1.6  1.7   Bilirubin Total 0.0 - 1.2 mg/dL 0.4  0.4  0.5 R  0.4  0.4   Alkaline Phosphatase 44 - 121 IU/L 75  89 CM  72 R  107 R  92 R   AST 0 - 40 IU/L 16  17  18  R  20  21   ALT 0 - 32 IU/L 16  14  16  R  14  14   Resulting Agency  [facility]     Component Ref Range & Units 3 wk ago  (01/12/21) 6 mo ago  (07/14/20) 1 yr ago  (01/03/20) 1 yr ago  (05/05/19)  WBC 3.4 - 10.8 x10E3/uL 9.7  7.8  8.1  7.5   RBC 3.77 - 5.28 x10E6/uL 4.64  4.43  4.53  4.79   Hemoglobin 11.1 - 15.9 g/dL 13.8  13.1  13.6  14.3   Hematocrit 34.0 - 46.6 % 42.2  39.3  40.3  43.3   MCV 79 - 97 fL 91  89  89  90   MCH 26.6 - 33.0 pg 29.7  29.6  30.0  29.9   MCHC 31.5 - 35.7 g/dL 32.7  33.3  33.7  33.0   RDW 11.7 - 15.4 % 12.5  11.5Low  12.4  12.2   Platelets 150 - 450 x10E3/uL 231  264  255  267   Neutrophils Not Estab. % 85  73  71  67   Lymphs Not Estab. % 7  17  19  21    Monocytes Not Estab. % 6  8  8  9    Eos Not Estab. % 0  1  1  1    Basos Not Estab. % 1  1  1  1    Neutrophils Absolute 1.4 - 7.0 x10E3/uL 8.3High  5.6  5.7  5.1   Lymphocytes Absolute 0.7 - 3.1 x10E3/uL 0.7  1.4  1.6  1.5   Monocytes Absolute 0.1 - 0.9 x10E3/uL 0.5  0.6  0.7  0.7   EOS (ABSOLUTE) 0.0 - 0.4 x10E3/uL 0.0  0.1  0.1  0.1   Basophils Absolute 0.0 - 0.2 x10E3/uL 0.1  0.1  0.1  0.1   Immature Granulocytes Not Estab. % 1  0  0  1   Immature Grans (Abs) 0.0 - 0.1 x10E3/uL 0.1  0.0  0.0  0.1    I reviewed available labs, medications, imaging, studies and related documents from the EMR.  Records reviewed and summarized above.   Vitals:   02/07/21 1421  BP: (!) 158/70  Pulse: 81   Resp: 18  SpO2: 97%   Physical Exam: General: frail appearing,obese, sitting on couch in her living room in NAD EYES: anicteric sclera, no discharge  ENMT: intact hearing, oral mucous membranes moist CV:+3 pitting BLE edema Pulmonary: no increased work of breathing at rest, no acute cough, no audible wheezes, no crackles Abomen: no ascites GU: deferred MSK:no contractures, right wrist pain, ambulatory Skin: warm and dry, Scratch marks on right lower leg Neuro: generalized weakness, otherwise non-focal Psych: non-anxious affecttoday, A and Ox 4 Hem/lymph/immuno: no widespread bruising  Past Medical History:  Diagnosis Date  . COPD (chronic obstructive pulmonary disease) ([place])   . GERD (gastroesophageal reflux disease)   . Hypertension    Current Outpatient Medications on File Prior to Visit  Medication Sig Dispense Refill  . albuterol (PROVENTIL) (2.5 MG/3ML) 0.083% nebulizer solution Take 3 mLs (2.5 mg total) by nebulization every 6 (six) hours as needed for wheezing or shortness of breath. Pt may use every 4 to 6 hours as needed 75 mL 12  . albuterol (VENTOLIN HFA) 108 (90 Base) MCG/ACT inhaler Inhale 2 puffs into the lungs every 4 (four) hours as needed for wheezing or shortness of breath. 18 g 5  . amLODipine (NORVASC) 5 MG tablet Take 1 tablet (5 mg total) by mouth daily. 90 tablet 1  . Budeson-Glycopyrrol-Formoterol (BREZTRI AEROSPHERE) 160-9-4.8 MCG/ACT AERO Inhale 2 puffs into the lungs daily. >>> 2 puffs in the morning right when you wake up, rinse out your mouth after use, 12 hours later 2 puffs, rinse after use >>> Take this daily, no matter what 5.9 g 0  . diclofenac Sodium (VOLTAREN) 1 % GEL Apply 4 g topically 4 (four) times daily. (Patient not taking: Reported on 01/12/2021) 350 g 1  . furosemide (LASIX) 20 MG tablet Take 1 tablet (20 mg total) by mouth daily. 30 tablet 3  . guaiFENesin (MUCINEX) 600 MG 12 hr tablet Take 600 mg by mouth as needed.     Marland Kitchen  omeprazole (PRILOSEC) 20 MG capsule Take 1 capsule (20 mg total) by mouth daily. 90 capsule 1  . potassium chloride (KLOR-CON) 10 MEQ tablet Take 1 tablet (10 mEq total) by mouth 2 (two) times daily. 180 tablet 1  . predniSONE (DELTASONE) 10 MG tablet Take 10 mg by mouth daily.    Marland Kitchen Spacer/Aero Chamber [employer] Use as directed 1 each 0  . triamcinolone (NASACORT) 55 MCG/ACT AERO nasal inhaler Place 2 sprays into the nose daily. 1 Inhaler 12   No current facility-administered medications on file prior to visit.   Thank you for the opportunity to participate in the care of Ms. Strawser.  The palliative care team will continue to follow. Please call our office at [phone] if we can be of additional assistance.   Jari Favre, DNP, AGPCNP-BC  COVID-19 PATIENT SCREENING TOOL Asked and negative response unless otherwise noted:   Have you had symptoms of covid, tested positive or been in contact with someone with symptoms/positive test in the past 5-10 days?

## 2021-02-08 ENCOUNTER — Encounter: Payer: Self-pay | Admitting: Emergency Medicine

## 2021-02-08 ENCOUNTER — Other Ambulatory Visit: Payer: Self-pay

## 2021-02-08 ENCOUNTER — Ambulatory Visit (INDEPENDENT_AMBULATORY_CARE_PROVIDER_SITE_OTHER): Payer: Medicare Other | Admitting: Emergency Medicine

## 2021-02-08 DIAGNOSIS — J9611 Chronic respiratory failure with hypoxia: Secondary | ICD-10-CM

## 2021-02-08 DIAGNOSIS — J438 Other emphysema: Secondary | ICD-10-CM

## 2021-02-08 NOTE — Assessment & Plan Note (Signed)
Please continue your Breztri 2 puffs twice a day.  Rinse and gargle after using. Continue your albuterol 2 puffs up to every 4 hours when needed for shortness of breath, chest tightness, wheezing.  We will work on getting the formulation changed so that it will be covered by insurance. Continue prednisone 5 mg daily Continue your Mucinex 600 mg twice a day.  You could consider increasing this to 1200 mg twice a day if you are having difficulty clearing mucus when you cough. Agree with increasing your Lasix to twice a day for 4 days as has been directed. You should get the COVID-19 booster sometime in the next 4 to 6 months Follow with Dr. Delton Coombes or APP in 3 months

## 2021-02-08 NOTE — Progress Notes (Signed)
Subjective:    Patient ID: Danielle Harmon, female    DOB: Dec 09, 1940, 80 y.o.   MRN: [id number]  HPI  ROV 03/02/20 --follow-up visit for 80 year old woman with severe COPD, severe obstruction by spirometry 2012, associated chronic hypoxemic respiratory failure.  She uses oxygen at 6 L/min. I last spoke to her on a tele-visit 10/21/2019.  She has been managed on Trelegy.  She uses albuterol approximately 2-3x a day. Does not use her nebulizer.  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  Pneumonia vaccine up to date  ROV 09/07/20 --Danielle Harmon is 80 and has a history of severe COPD and associated chronic hypoxemic respiratory failure.  Also with allergic rhinitis.  She has been most recently seen by B. Mack in our office in September.  She is getting assistance from Palliative Care.  On oxygen at 4-6 L/min.  She has albuterol which she uses approximately.  Her current maintenance medication is Clinical cytogeneticist (changed from [employer]).  She uses albuterol 2x a day.  She reports that her breathing is very difficult - she has SOB with moving through the house.  She coughs most days, in the afternoon, clears some mucous. She is on mucinex bid.  CODE STATUS DNR/DNI  ROV 02/08/21 --follow-up visit for 80 year old woman with a history of severe COPD and associated chronic hypoxemic respiratory failure.  She has support from palliative care.  She also deals with allergic rhinitis.  She is on oxygen at 4-6 L/min.  Maintenance medication is Breztri, prednisone 5 mg daily.  She has albuterol which she uses approximately 4x a week. Usually when she is about to exert herself. She has a lot of LE edema - just had her lasix increased to 20mg  bid x 4 days Mucinex twice a day. She has some daily cough,   Review of Systems As per HPI     Objective:   Physical Exam  Vitals:   02/08/21 1012  BP: (!) 142/88  Pulse: 80  SpO2: 100%   Weight: 200 lb (90.7 kg)  Height: 5\' 6"  (1.676 m)   Gen: Pleasant, well-nourished, in no distress in wheelchair,  normal affect, O2 in place  ENT: No lesions,  mouth clear,  oropharynx clear, no postnasal drip  Neck: No JVD, no stridor  Lungs: No use of accessory muscles, good air movement, no wheezing or crackles  Cardiovascular: RRR, heart sounds normal, no murmur or gallops, 2+ LE peripheral edema, some weeping on R  Musculoskeletal: No deformities, no cyanosis or clubbing  Neuro: alert, non focal  Skin: Warm, no lesions or rashes      Assessment & Plan:  COPD (chronic obstructive pulmonary disease) with emphysema Please continue your Breztri 2 puffs twice a day.  Rinse and gargle after using. Continue your albuterol 2 puffs up to every 4 hours when needed for shortness of breath, chest tightness, wheezing.  We will work on getting the formulation changed so that it will be covered by insurance. Continue prednisone 5 mg daily Continue your Mucinex 600 mg twice a day.  You could consider increasing this to 1200 mg twice a day if you are having difficulty clearing mucus when you cough. Agree with increasing your Lasix to twice a day for 4 days as has been directed. You should get the COVID-19 booster sometime in the next 4 to 6 months Follow with Dr. 02/10/21 or APP  in 3 months  Chronic respiratory failure with hypoxia (HCC) Stable on current oxygen, plan to continue.  Levy Pupa, MD, PhD 02/08/2021, 10:57 AM [facility] [phone] or if no answer [phone]

## 2021-02-08 NOTE — Assessment & Plan Note (Signed)
Stable on current oxygen, plan to continue.

## 2021-02-08 NOTE — Patient Instructions (Addendum)
Please continue your Breztri 2 puffs twice a day.  Rinse and gargle after using. Continue your albuterol 2 puffs up to every 4 hours when needed for shortness of breath, chest tightness, wheezing.  We will work on getting the formulation changed so that it will be covered by insurance. Continue prednisone 5 mg daily Continue your oxygen as you have been using it. Continue your Mucinex 600 mg twice a day.  You could consider increasing this to 1200 mg twice a day if you are having difficulty clearing mucus when you cough. Agree with increasing your Lasix to twice a day for 4 days as has been directed. You should get the COVID-19 booster sometime in the next 4 to 6 months Follow with Dr. Delton Coombes or APP in 3 months

## 2021-03-14 ENCOUNTER — Other Ambulatory Visit: Payer: Medicare Other | Admitting: Nurse Practitioner

## 2021-03-14 ENCOUNTER — Other Ambulatory Visit: Payer: Self-pay

## 2021-03-14 VITALS — BP 164/78 | HR 85 | Resp 20 | Wt 195.0 lb

## 2021-03-14 DIAGNOSIS — Z515 Encounter for palliative care: Secondary | ICD-10-CM

## 2021-03-14 DIAGNOSIS — J9611 Chronic respiratory failure with hypoxia: Secondary | ICD-10-CM

## 2021-03-14 DIAGNOSIS — I10 Essential (primary) hypertension: Secondary | ICD-10-CM

## 2021-03-14 NOTE — Progress Notes (Signed)
[facility] Consult Note Telephone: [phone]  Fax: [phone]    Date of encounter: 03/14/21 PATIENT NAME: Danielle Harmon [place] [address]   [phone] (home)  DOB: [birth year] MRN: [id number]  PRIMARY CARE PROVIDER:    Chevis Pretty, [place],  [place] [address] [phone]  REFERRING PROVIDER:   Chevis Pretty, [address],  [place] [address] [phone]  RESPONSIBLE PARTY:    Contact Information     Name Relation Home Work Mobile   Christana, Angelica [phone]     [place] Relative [phone]       I met face to face with patient in home.  Palliative Care was asked to follow this patient by consultation request of  Hassell Done, Mary-Margaret, * to address advance care planning and complex medical decision making. This is a follow up visit.                                   ASSESSMENT AND PLAN / RECOMMENDATIONS:   Advance Care Planning/Goals of Care:  CODE STATUS: DNR Goal of Care: Patient's goal of care is function. Her goal is to remain in her home till end of life.  Directives: Signed DNR and MOST forms present in home, copy on [facility] EMR. Details of MOST form include limited additional intervention, antibiotics if indicated, IV fluids for a defined trial period, feeding tube for defined trial period.  Patient reiterated desire to not be resuscitated in the event of cardiac or respiratory arrest. Reviewed MOST form elections, no change made today. Palliative care will continue to provide support to patient, family and medical team.  I spent 18 minutes providing this consultation. More than 50% of the time in this consultation was spent in counseling and care coordination. --------------------------------------------------------------------------------------------------   Symptom Management/Plan: Elevated blood pressure: BP  reading today 164/78 with HR 85. Recommend increasing Furosemide to 33m daily to help with lower extremity edema. Renal function appeared stable (Cr 0.83, last GFR 80). Continue Amlodipine 546mdaily. Recommend close monitoring of Renal function. Chronic respiratory failure with Hypoxia: Respiratory failure in the setting of COPD/emphysema is ongoing and progressing. Report SOB with exertion, none at rest, denied increased cough. Last visit with pulmonologist (Dr. ByLamonte Sakaiwas on 02/08/2021. Review of visit note showed no new orders. Recommended, continuation of current plan of care with Brezti 2 puff twice a day, Prednisone 15m64maily, Mucinex 600m6mice a day and Albuterol inhaler every 4hrs as needed. Provided general support and encouragement. Questions and concerns were addressed. Patient was encouraged to call with questions and/or concerns.   Follow up Palliative Care Visit: Palliative care will continue to follow for complex medical decision making, advance care planning, and clarification of goals. Return as needed.  PPS: 50%  HOSPICE ELIGIBILITY/DIAGNOSIS: TBD  CHIEF COMPLAIN: elevated blood presure  History obtained from review of Epic EMR and discussion with Ms. Golding.   HISTORY OF PRESENT ILLNESS:  BrenAnetria Harwicka 80 y32. year old female with multiple medical problems including COPD (emphysema), supplemental oxygen dependent, allergic rhinitis, GERD, HTN.  Patient report elevated blood pressure readings. Review of blood pressure readings in the last month showed SBP range 151-168 with average of 157. Patient currently on Amlodipine 15mg 57mly and Furosemide 20mg 29my. She report feeling well overall, report fair appetite, denied insomnia, denied chest pain, denied headache, denied increased  SOB at rest, denied recent falls.Ten systems reviewed and are negative for acute change, except as noted in the HPI.  Bilateral lower extremities edema is slightly improved from +3 to +2. Patient had  compression stocking on, report compliance with legs elevation during prolonged sitting.    Reviewed labs from 01/12/2021 Na 140, K 4.3, Cr 0.83, Total protein 6.4, Albumin 4.2, WBC 9.7, Hgb 13.8, Hct 42.2 GFR 80 on 07/14/2020  I reviewed available labs, medications, imaging, studies and related documents from the EMR.  Records reviewed and summarized above.   Vitals:   03/14/21 1316  BP: (!) 164/78  Pulse: 85  Resp: 20  Weight: 195 lb (88.5 kg)  SpO2: 98% Comment: on 5L oxygen    Physical Exam: Current and past weights: 198lbs stable from last visit, BMI 32.09kg/m2 General: obese, frail appearing, siting in chair in NAD Cardiovascular: denied chest pain, 2+ edema to bilateral lower legs Pulmonary: no acute cough, SOB with exertion, no adventitious lung sound auscultated Abdomen: appetite fair, denied constipation GU: denies dysuria MSK: no joint and ROM abnormalities, ambulatory Skin: no rashes or wounds reported or noted on exposed skin Neurological: weakness, but otherwise non-focal Psych: non -anxious affect  Past Medical History:  Diagnosis Date   COPD (chronic obstructive pulmonary disease) (HCC)    GERD (gastroesophageal reflux disease)    Hypertension     Current Outpatient Medications on File Prior to Visit  Medication Sig Dispense Refill   albuterol (PROVENTIL) (2.5 MG/3ML) 0.083% nebulizer solution Take 3 mLs (2.5 mg total) by nebulization every 6 (six) hours as needed for wheezing or shortness of breath. Pt may use every 4 to 6 hours as needed 75 mL 12   albuterol (VENTOLIN HFA) 108 (90 Base) MCG/ACT inhaler Inhale 2 puffs into the lungs every 4 (four) hours as needed for wheezing or shortness of breath. 18 g 5   amLODipine (NORVASC) 5 MG tablet Take 1 tablet (5 mg total) by mouth daily. 90 tablet 1   Budeson-Glycopyrrol-Formoterol (BREZTRI AEROSPHERE) 160-9-4.8 MCG/ACT AERO Inhale 2 puffs into the lungs daily. >>> 2 puffs in the morning right when you wake up,  rinse out your mouth after use, 12 hours later 2 puffs, rinse after use >>> Take this daily, no matter what 5.9 g 0   furosemide (LASIX) 20 MG tablet Take 1 tablet (20 mg total) by mouth daily. 30 tablet 3   guaiFENesin (MUCINEX) 600 MG 12 hr tablet Take 600 mg by mouth as needed.      omeprazole (PRILOSEC) 20 MG capsule Take 1 capsule (20 mg total) by mouth daily. 90 capsule 1   potassium chloride (KLOR-CON) 10 MEQ tablet Take 1 tablet (10 mEq total) by mouth 2 (two) times daily. 180 tablet 1   predniSONE (DELTASONE) 10 MG tablet Take 5 mg by mouth daily.     Spacer/Aero Chamber [employer] Use as directed 1 each 0   triamcinolone (NASACORT) 55 MCG/ACT AERO nasal inhaler Place 2 sprays into the nose daily. 1 Inhaler 12   No current facility-administered medications on file prior to visit.    Thank you for the opportunity to participate in the care of Ms. Barga.  The palliative care team will continue to follow. Please call our office at [phone] if we can be of additional assistance.   Jari Favre, DNP, AGPCNP-BC  COVID-19 PATIENT SCREENING TOOL Asked and negative response unless otherwise noted:   Have you had symptoms of covid, tested positive or been in contact with  someone with symptoms/positive test in the past 5-10 days?

## 2021-04-10 ENCOUNTER — Telehealth: Payer: Self-pay | Admitting: Emergency Medicine

## 2021-04-10 NOTE — Telephone Encounter (Signed)
Is it ok to change appointment to televisit. Patient states she doesn't walk much, can't breathe in the heat and doesn't have small O2 tanks.  Dr. Delton Coombes please advise

## 2021-04-11 NOTE — Telephone Encounter (Signed)
Called and spoke to pt. Informed her of the visit change. Appt notes changed to reflect upcoming visit will be televisit. Pt verbalized understanding and denied any further questions or concerns at this time.

## 2021-04-11 NOTE — Telephone Encounter (Signed)
Yes, ok to change

## 2021-04-23 ENCOUNTER — Other Ambulatory Visit: Payer: Self-pay | Admitting: Emergency Medicine

## 2021-04-23 DIAGNOSIS — J438 Other emphysema: Secondary | ICD-10-CM

## 2021-04-26 ENCOUNTER — Other Ambulatory Visit: Payer: Self-pay | Admitting: Emergency Medicine

## 2021-04-26 DIAGNOSIS — J438 Other emphysema: Secondary | ICD-10-CM

## 2021-05-08 ENCOUNTER — Telehealth: Payer: Self-pay

## 2021-05-08 NOTE — Telephone Encounter (Signed)
(  2:29 pm) Palliative Care SW rescheduled patient's visit  for 05/31/21 @ 9:30 am.

## 2021-05-09 ENCOUNTER — Encounter: Payer: Self-pay | Admitting: Emergency Medicine

## 2021-05-09 ENCOUNTER — Ambulatory Visit (INDEPENDENT_AMBULATORY_CARE_PROVIDER_SITE_OTHER): Payer: Medicare Other | Admitting: Emergency Medicine

## 2021-05-09 ENCOUNTER — Other Ambulatory Visit: Payer: Self-pay

## 2021-05-09 DIAGNOSIS — J438 Other emphysema: Secondary | ICD-10-CM | POA: Diagnosis not present

## 2021-05-09 DIAGNOSIS — I10 Essential (primary) hypertension: Secondary | ICD-10-CM | POA: Diagnosis not present

## 2021-05-09 DIAGNOSIS — J9611 Chronic respiratory failure with hypoxia: Secondary | ICD-10-CM

## 2021-05-09 NOTE — Progress Notes (Signed)
Virtual Visit via Telephone Note  I connected with Danielle Harmon on 05/09/21 at 11:45 AM EDT by telephone and verified that I am speaking with the correct person using two identifiers.  Location: Patient: Home Provider: Office   I discussed the limitations, risks, security and privacy concerns of performing an evaluation and management service by telephone and the availability of in person appointments. I also discussed with the patient that there may be a patient responsible charge related to this service. The patient expressed understanding and agreed to proceed.   History of Present Illness: 80 year old woman with severe COPD and associated chronic hypoxemic respiratory failure on 4-6 L/min.  She also has allergic rhinitis.  We have been managing her on Breztri as well as prednisone 5 mg daily, Mucinex.  She has palliative care support.   Observations/Objective: She has SOB if she goes too fast, able to exert if she goes slowly. No flares, no abx or increases in Pred. She has occasional cough, clear sputum. She has nasal congestion and drip, not using her Nasacort regularly. She does believe that Palliative Care has been beneficial. Taking mucinex 600mg  bid.  She has been dealing with labile HTN - on amlodipine and lasix. She has a lot of LE edema as well.   Assessment and Plan: Hypertension Marginal control, increased lower extremity edema.  Suspect she will need to have her BP regimen adjusted when she talks to her PCP and palliative care.  I will temporarily increase her Lasix from 20 mg daily to 40 mg daily for 3 days and then should go back to her usual 20 mg to see if we can affect her lower extremity edema.  Chronic respiratory failure with hypoxia (HCC) Continue current oxygen 4-6 L/min depending on level of exertion  COPD (chronic obstructive pulmonary disease) with emphysema (HCC) Very severe COPD with limitation.  She is tolerating Breztri, benefiting from it.  Also prednisone  5 mg.  No clear indication increase it at this time.  She is on Mucinex 60 mg twice daily and benefits from this as well.  I confirmed today that she is DNR and has a DNR form visible in her home.  I will make sure that the orders are reflected in epic.   Follow Up Instructions: 4 months or prn   I discussed the assessment and treatment plan with the patient. The patient was provided an opportunity to ask questions and all were answered. The patient agreed with the plan and demonstrated an understanding of the instructions.   The patient was advised to call back or seek an in-person evaluation if the symptoms worsen or if the condition fails to improve as anticipated.  I provided 21 minutes of non-face-to-face time during this encounter.   , MD

## 2021-05-09 NOTE — Assessment & Plan Note (Signed)
Very severe COPD with limitation.  She is tolerating Breztri, benefiting from it.  Also prednisone 5 mg.  No clear indication increase it at this time.  She is on Mucinex 60 mg twice daily and benefits from this as well.  I confirmed today that she is DNR and has a DNR form visible in her home.  I will make sure that the orders are reflected in epic.

## 2021-05-09 NOTE — Assessment & Plan Note (Signed)
Continue current oxygen 4-6 L/min depending on level of exertion

## 2021-05-09 NOTE — Assessment & Plan Note (Signed)
Marginal control, increased lower extremity edema.  Suspect she will need to have her BP regimen adjusted when she talks to her PCP and palliative care.  I will temporarily increase her Lasix from 20 mg daily to 40 mg daily for 3 days and then should go back to her usual 20 mg to see if we can affect her lower extremity edema.

## 2021-05-15 ENCOUNTER — Other Ambulatory Visit: Payer: Self-pay | Admitting: Nurse Practitioner

## 2021-05-15 DIAGNOSIS — R609 Edema, unspecified: Secondary | ICD-10-CM

## 2021-05-31 ENCOUNTER — Other Ambulatory Visit: Payer: Medicare Other

## 2021-05-31 ENCOUNTER — Other Ambulatory Visit: Payer: Medicare Other | Admitting: *Deleted

## 2021-05-31 ENCOUNTER — Telehealth: Payer: Self-pay | Admitting: Nurse Practitioner

## 2021-05-31 ENCOUNTER — Other Ambulatory Visit: Payer: Self-pay

## 2021-05-31 VITALS — BP 163/93 | HR 75 | Temp 97.7°F | Resp 20

## 2021-05-31 DIAGNOSIS — Z515 Encounter for palliative care: Secondary | ICD-10-CM

## 2021-05-31 NOTE — Telephone Encounter (Signed)
Monishia called from [facility] stating that she had a visit with patient today and says patient was complaining about her BP being elevated and also swelling of her feet and legs. Nurse said her BP was 163/93 and says pt does show Adema of both feet and legs, up to her knees, with right leg being worse than left leg. Nurse says pt is no longer complaining of SOB but wonders if MMM needs to adjust her BP and fluid meds. Says pt is currently taking Norvasc 5mg  daily and Lasix 20mg  daily.  Please advise and call patient.

## 2021-05-31 NOTE — Progress Notes (Signed)
COMMUNITY PALLIATIVE CARE SW NOTE  PATIENT NAME: Danielle Harmon DOB: 22-Sep-1941 MRN: [id number]  PRIMARY CARE PROVIDER: Bennie Pierini, FNP  RESPONSIBLE PARTY:  Acct ID - Guarantor Home Phone Work Phone Relationship Acct Type  [id number] Danielle Harmon [phone]  Self P/F     [address], [place], [place] [address]     PLAN OF CARE and INTERVENTIONS:             GOALS OF CARE/ ADVANCE CARE PLANNING:  Goal is for patient to remain in her home, as independent as possible. Patient is a DNR.  SOCIAL/EMOTIONAL/SPIRITUAL ASSESSMENT/ INTERVENTIONS:  SW and RN- M. Dimas Aguas completed a follow-up visit with patient at her home. The patient provided a status update on herself. She continues to have high blood pressures. She was observed to have edema in her legs and ankles. Patient report that she does elevate her legs and wears compression hose. Patient had and edema pocket on her right elbow that she reported was "leaking" two days ago. She has several wounds/bruises on his right arm from hitting it on the refrigerator and a scratch from her cat. Patient report that she has had several bruises as her skin is very fragile. She wears o2 continuously 4-6 L depending on her activity. She report that her appetite is good and have no swallowing issues. She manages her own medications. She does have SOB and intermittent dizziness. Patient is independent for ADL's. She utilizes a Museum/gallery exhibitions officer to ambulate.The RN review her medications. SW assessed her safety. Patient has a life alert that she wears around her neck. She has three children that checks on her daily. Patient was engaged and cordial throughout the visit. No other concerns.  PATIENT/CAREGIVER EDUCATION/ COPING:  Patient appears to be coping well. She is alert and oriented x3 with some forgetfulness. She has a supportive family and neighbors.  PERSONAL EMERGENCY PLAN:  Patient wears a Life Alert around her neck. 911 can be activated for emergencies.   COMMUNITY RESOURCES COORDINATION/ HEALTH CARE NAVIGATION:  None. FINANCIAL/LEGAL CONCERNS/INTERVENTIONS:  None.     SOCIAL HX:  Social History   Tobacco Use   Smoking status: Former    Packs/day: 1.00    Years: 40.00    Pack years: 40.00    Types: Cigarettes    Quit date: 09/23/2002    Years since quitting: 18.6   Smokeless tobacco: Never  Substance Use Topics   Alcohol use: Yes    Comment: occ    CODE STATUS: DNR ADVANCED DIRECTIVES: No MOST FORM COMPLETE:  Yes HOSPICE EDUCATION PROVIDED: No  PPS: Patient is alert and oriented x3, but forgetful. She ambulates with a rolator. She is dependent on o2 continuous.  Duration of visit and documentation: 60 minutes.   [address] [place], [place]

## 2021-05-31 NOTE — Telephone Encounter (Signed)
PT IS AWARE

## 2021-05-31 NOTE — Telephone Encounter (Signed)
Increase lasix to 40mg  a day and let me know necxt week how she is doing

## 2021-05-31 NOTE — Progress Notes (Signed)
[facility] RN NOTE  PATIENT NAME: Danielle Harmon DOB: 09/08/1941 MRN: [id number]  PRIMARY CARE PROVIDER: Chevis Pretty, FNP  RESPONSIBLE PARTY:  Acct ID - Guarantor Home Phone Work Phone Relationship Acct Type  [id number] Danielle Harmon [phone]  Self P/F     [place], Danielle, [place] [address]   Covid-19 Pre-screening Negative  PLAN OF CARE and INTERVENTION:  ADVANCE CARE PLANNING/GOALS OF CARE: Goal is for patient to remain in her home. She has a DNR and a MOST form PATIENT/CAREGIVER EDUCATION: Symptom management, edema management, safe mobility/transfers, s/s of infection DISEASE STATUS: Joint palliative care visit completed with LCSW, M. Lonon. Met with patient in her home. She is alert and oriented x 4, pleasant and engaging. She denies pain. She is oxygen dependent. She wears at 4-6L/min via [place]. She does become short of breath with exertion, but able to recover without difficulty. She mainly has oxygen set at 4L at rest and increases to 6L with exertion. During visit her oxygen level is 100% on 6L and I advised that she should turn in down to the 4L setting since she has a diagnosis of COPD to help prevent hypercapnia. She reports having issues of elevated blood pressures. She is currently on Norvasc 5 mg daily. She also has 2-3+ pitting edema in bilateral feet, ankles and up to her knees. Right leg edema is always greater than the left. She does wear compression socks along with elevating her legs, which helps some but not always. They are tight and shiny in appearance. She is taking Lasix 20 mg daily. She denies any worsening shortness of breath. She also has a pocket of fluid noted to her right elbow. It is not painful, but was weeping a few days ago. There is a small scabbed area at the center of this pocket which is most likely where the fluid was leaking from. Advised I will reach out to her PCP. I called and spoke with [facility] with office of  [place] FNP. I advised of the above noted information. [name] states that she will give this message to PCP and they will contact patient. According to Epic, NP ordered her to increase her Lasix to 40 mg daily and for patient to contact her next week to let her know how she is doing.   HISTORY OF PRESENT ILLNESS: This is a 80 yo female with a diagnosis of COPD. She has a history of hypertension, GERD and peripheral edema. Palliative care team continues to follow patient for symptom management, goals of care and complex decision making  CODE STATUS: DNR ADVANCED DIRECTIVES: Y MOST FORM: yes PPS: 60%   PHYSICAL EXAM:   VITALS: Today's Vitals   05/31/21 1016  BP: (!) 163/93  Pulse: 75  Resp: 20  Temp: 97.7 F (36.5 C)  TempSrc: Temporal  SpO2: 100%  PainSc: 0-No pain    LUNGS: clear to auscultation  CARDIAC: Cor RRR EXTREMITIES: 2-3+ and pitting edema to bilateral lower extremities from feet up to knees R>L SKIN:  Thin/frail skin; skin tears to right hand, wrist and forearm (2 from cat scratching her and 1 from hitting hand on refrigerator door. They have scabbed over. Applying antibiotic ointment daily   NEURO:  Alert and oriented x 4, pleasant mood, ambulatory w/walker, independent with ADLs   (Duration of visit and documentation 60 minutes)    Daryl Eastern, RN BSN

## 2021-06-29 ENCOUNTER — Other Ambulatory Visit: Payer: Self-pay | Admitting: Nurse Practitioner

## 2021-06-29 DIAGNOSIS — R609 Edema, unspecified: Secondary | ICD-10-CM

## 2021-07-02 ENCOUNTER — Ambulatory Visit (INDEPENDENT_AMBULATORY_CARE_PROVIDER_SITE_OTHER): Payer: Medicare Other

## 2021-07-02 VITALS — Ht 66.0 in | Wt 195.0 lb

## 2021-07-02 DIAGNOSIS — Z0000 Encounter for general adult medical examination without abnormal findings: Secondary | ICD-10-CM

## 2021-07-02 NOTE — Progress Notes (Signed)
Subjective:   Danielle Harmon is a 80 y.o. female who presents for Medicare Annual (Subsequent) preventive examination.  Virtual Visit via Telephone Note  I connected with  Danielle Harmon on 07/02/21 at  9:00 AM EDT by telephone and verified that I am speaking with the correct person using two identifiers.  Location: Patient: Home Provider: [facility] Persons participating in the virtual visit: patient/Nurse Health Advisor   I discussed the limitations, risks, security and privacy concerns of performing an evaluation and management service by telephone and the availability of in person appointments. The patient expressed understanding and agreed to proceed.  Interactive audio and video telecommunications were attempted between this nurse and patient, however failed, due to patient having technical difficulties OR patient did not have access to video capability.  We continued and completed visit with audio only.  Some vital signs may be absent or patient reported.   Rickie Gange E Wing Gfeller, LPN   Review of Systems     Cardiac Risk Factors include: advanced age (>56men, >34 women);sedentary lifestyle;obesity (BMI >30kg/m2);hypertension;Other (see comment), Risk factor comments: COPD, Respiratory Failure     Objective:    Today's Vitals   07/02/21 0905  Weight: 195 lb (88.5 kg)  Height: 5\' 6"  (1.676 m)   Body mass index is 31.47 kg/m.  Advanced Directives 07/02/2021 05/23/2020 05/23/2020 05/19/2019  Does Patient Have a Medical Advance Directive? Yes Yes No No;Yes  Type of 05/21/2019 of [place];Out of facility DNR (pink MOST or yellow form);Living will Out of facility DNR (pink MOST or yellow form) - Healthcare Power of [place];Living will  Does patient want to make changes to medical advance directive? - No - Patient declined - No - Patient declined  Copy of Healthcare Power of Attorney in Chart? No - copy requested - - No - copy requested  Would patient like information on  creating a medical advance directive? - - No - Guardian declined Yes (MAU/Ambulatory/Procedural Areas - Information given);No - Patient declined    Current Medications (verified) Outpatient Encounter Medications as of 07/02/2021  Medication Sig   albuterol (PROVENTIL) (2.5 MG/3ML) 0.083% nebulizer solution Take 3 mLs (2.5 mg total) by nebulization every 6 (six) hours as needed for wheezing or shortness of breath. Pt may use every 4 to 6 hours as needed   albuterol (VENTOLIN HFA) 108 (90 Base) MCG/ACT inhaler Inhale 2 puffs into the lungs every 4 (four) hours as needed for wheezing or shortness of breath.   amLODipine (NORVASC) 5 MG tablet Take 1 tablet (5 mg total) by mouth daily.   BREZTRI AEROSPHERE 160-9-4.8 MCG/ACT AERO TAKE 2 PUFFS BY MOUTH TWICE A DAY   furosemide (LASIX) 20 MG tablet TAKE 1 TABLET BY MOUTH EVERY DAY   guaiFENesin (MUCINEX) 600 MG 12 hr tablet Take 600 mg by mouth as needed.    omeprazole (PRILOSEC) 20 MG capsule Take 1 capsule (20 mg total) by mouth daily.   potassium chloride (KLOR-CON) 10 MEQ tablet Take 1 tablet (10 mEq total) by mouth 2 (two) times daily.   predniSONE (DELTASONE) 10 MG tablet TAKE 1 TABLET BY MOUTH EVERY DAY (Patient taking differently: 5 mg.)   Spacer/Aero Chamber Mouthpiece MISC Use as directed   triamcinolone (NASACORT) 55 MCG/ACT AERO nasal inhaler Place 2 sprays into the nose daily.   No facility-administered encounter medications on file as of 07/02/2021.    Allergies (verified) Bee venom   History: Past Medical History:  Diagnosis Date   COPD (chronic obstructive pulmonary disease) (HCC)  GERD (gastroesophageal reflux disease)    Hypertension    Past Surgical History:  Procedure Laterality Date   CHOLECYSTECTOMY     CYSTECTOMY     benign cysts removed from head and R hand   KNEE ARTHROSCOPY     Left   Family History  Problem Relation Age of Onset   Emphysema Father    COPD Father    Stroke Mother    Diabetes Son     Social History   Socioeconomic History   Marital status: Widowed    Spouse name: Marvell Fuller Patino   Number of children: 3   Years of education: 16   Highest education level: Bachelor's degree (e.g., BA, AB, BS)  Occupational History   Occupation: Engineer, site    Comment: retired  Tobacco Use   Smoking status: Former    Packs/day: 1.00    Years: 40.00    Pack years: 40.00    Types: Cigarettes    Quit date: 09/23/2002    Years since quitting: 18.7   Smokeless tobacco: Never  Vaping Use   Vaping Use: Never used  Substance and Sexual Activity   Alcohol use: Yes    Comment: occ   Drug use: Never   Sexual activity: Not on file  Other Topics Concern   Not on file  Social History Narrative   Lives alone - has a basement, but she doesn't go down there   Social Determinants of Health   Financial Resource Strain: Low Risk    Difficulty of Paying Living Expenses: Not hard at all  Food Insecurity: No Food Insecurity   Worried About Programme researcher, broadcasting/film/video in the Last Year: Never true   Ran Out of Food in the Last Year: Never true  Transportation Needs: No Transportation Needs   Lack of Transportation (Medical): No   Lack of Transportation (Non-Medical): No  Physical Activity: Inactive   Days of Exercise per Week: 0 days   Minutes of Exercise per Session: 0 min  Stress: No Stress Concern Present   Feeling of Stress : Not at all  Social Connections: Socially Isolated   Frequency of Communication with Friends and Family: More than three times a week   Frequency of Social Gatherings with Friends and Family: Twice a week   Attends Religious Services: Never   Database administrator or Organizations: No   Attends Banker Meetings: Never   Marital Status: Widowed    Tobacco Counseling Counseling given: Not Answered   Clinical Intake:  Pre-visit preparation completed: Yes  Pain : No/denies pain     BMI - recorded: 31.47 Nutritional Status: BMI > 30   Obese Nutritional Risks: None Diabetes: No  How often do you need to have someone help you when you read instructions, pamphlets, or other written materials from your doctor or pharmacy?: 1 - Never  Diabetic? No  Interpreter Needed?: No  Information entered by :: Lucienne Sawyers, LPN   Activities of Daily Living In your present state of health, do you have any difficulty performing the following activities: 07/02/2021  Hearing? N  Vision? N  Difficulty concentrating or making decisions? N  Walking or climbing stairs? Y  Dressing or bathing? N  Doing errands, shopping? Y  Preparing Food and eating ? N  Using the Toilet? N  In the past six months, have you accidently leaked urine? N  Do you have problems with loss of bowel control? N  Managing your Medications? N  Managing  your Finances? N  Housekeeping or managing your Housekeeping? N  Some recent data might be hidden    Patient Care Team: Bennie Pierini, FNP as PCP - General (Family Medicine) Leslye Peer, MD as Consulting Physician (Pulmonary Disease)  Indicate any recent Medical Services you may have received from other than [facility] providers in the past year (date may be approximate).     Assessment:   This is a routine wellness examination for Danielle Harmon.  Hearing/Vision screen Hearing Screening - Comments:: Denies hearing difficulties  Vision Screening - Comments:: Wears reading glasses prn only - no eye doctor  Dietary issues and exercise activities discussed: Current Exercise Habits: The patient does not participate in regular exercise at present, Exercise limited by: respiratory conditions(s);orthopedic condition(s)   Goals Addressed             This Visit's Progress    DIET - EAT MORE FRUITS AND VEGETABLES   Not on track    Exercise 3x per week (30 min per time)   Not on track    Has continuous oxygen  and is limited with excersion.     Move arms and legs more while sitting.       Depression  Screen PHQ 2/9 Scores 07/02/2021 01/12/2021 07/14/2020 05/23/2020 01/03/2020 05/19/2019 05/05/2019  PHQ - 2 Score 0 0 0 0 0 0 0    Fall Risk Fall Risk  07/02/2021 01/12/2021 07/14/2020 05/23/2020 01/03/2020  Falls in the past year? 0 0 0 0 1  Number falls in past yr: 0 - - - 0  Injury with Fall? 0 - - - 0  Risk for fall due to : Impaired balance/gait - - - -  Risk for fall due to: Comment - - - - -  Follow up Falls prevention discussed - - - -    FALL RISK PREVENTION PERTAINING TO THE HOME:  Any stairs in or around the home? Yes  If so, are there any without handrails? No  Home free of loose throw rugs in walkways, pet beds, electrical cords, etc? Yes  Adequate lighting in your home to reduce risk of falls? Yes   ASSISTIVE DEVICES UTILIZED TO PREVENT FALLS:  Life alert? Yes  Use of a cane, walker or w/c? Yes  Grab bars in the bathroom? Yes  Shower chair or bench in shower? Yes  Elevated toilet seat or a handicapped toilet? Yes   TIMED UP AND GO:  Was the test performed? No . Telephonic visit  Cognitive Function: Normal cognitive status assessed by direct observation by this Nurse Health Advisor. No abnormalities found.      6CIT Screen 05/23/2020 05/19/2019  What Year? 0 points 0 points  What month? 0 points 0 points  What time? 0 points 0 points  Count back from 20 0 points 0 points  Months in reverse 0 points 0 points  Repeat phrase 0 points 0 points  Total Score 0 0    Immunizations Immunization History  Administered Date(s) Administered   Influenza Inj Mdck Quad With Preservative 06/29/2019   Influenza Whole 07/05/2014   Influenza, High Dose Seasonal PF 07/03/2015, 06/26/2016, 06/02/2017, 07/07/2018, 06/15/2020   Influenza, Seasonal, Injecte, Preservative Fre 06/26/2015   Influenza,inj,Quad PF,6+ Mos 06/23/2013, 06/26/2015   Influenza-Unspecified 07/20/2014   Moderna Sars-Covid-2 Vaccination 10/28/2019, 11/26/2019, 08/12/2020   Pneumococcal Conjugate-13 03/30/2015,  10/10/2015   Pneumococcal Polysaccharide-23 09/24/2011, 06/23/2012   Tdap 09/07/2018    TDAP status: Up to date  Flu Vaccine status: Due, Education has been  provided regarding the importance of this vaccine. Advised may receive this vaccine at local pharmacy or Health Dept. Aware to provide a copy of the vaccination record if obtained from local pharmacy or Health Dept. Verbalized acceptance and understanding.  Pneumococcal vaccine status: Up to date  Covid-19 vaccine status: Completed vaccines  Qualifies for Shingles Vaccine? Yes   Zostavax completed No   Shingrix Completed?: No.    Education has been provided regarding the importance of this vaccine. Patient has been advised to call insurance company to determine out of pocket expense if they have not yet received this vaccine. Advised may also receive vaccine at local pharmacy or Health Dept. Verbalized acceptance and understanding.  Screening Tests Health Maintenance  Topic Date Due   Zoster Vaccines- Shingrix (1 of 2) Never done   COVID-19 Vaccine (4 - Booster for Moderna series) 12/10/2020   INFLUENZA VACCINE  04/23/2021   DEXA SCAN  01/12/2022 (Originally 01/06/2006)   TETANUS/TDAP  09/07/2028   HPV VACCINES  Aged Out    Health Maintenance  Health Maintenance Due  Topic Date Due   Zoster Vaccines- Shingrix (1 of 2) Never done   COVID-19 Vaccine (4 - Booster for Moderna series) 12/10/2020   INFLUENZA VACCINE  04/23/2021    Colorectal cancer screening: No longer required.   Mammogram status: No longer required due to age.  Bone Density declined  Lung Cancer Screening: (Low Dose CT Chest recommended if Age 43-80 years, 30 pack-year currently smoking OR have quit w/in 15years.) does not qualify.  Additional Screening:  Hepatitis C Screening: does not qualify  Vision Screening: Recommended annual ophthalmology exams for early detection of glaucoma and other disorders of the eye. Is the patient up to date with their  annual eye exam?  No  Who is the provider or what is the name of the office in which the patient attends annual eye exams? none If pt is not established with a provider, would they like to be referred to a provider to establish care? No .   Dental Screening: Recommended annual dental exams for proper oral hygiene  Community Resource Referral / Chronic Care Management: CRR required this visit?  No   CCM required this visit?  No      Plan:     I have personally reviewed and noted the following in the patient's chart:   Medical and social history Use of alcohol, tobacco or illicit drugs  Current medications and supplements including opioid prescriptions.  Functional ability and status Nutritional status Physical activity Advanced directives List of other physicians Hospitalizations, surgeries, and ER visits in previous 12 months Vitals Screenings to include cognitive, depression, and falls Referrals and appointments  In addition, I have reviewed and discussed with patient certain preventive protocols, quality metrics, and best practice recommendations. A written personalized care plan for preventive services as well as general preventive health recommendations were provided to patient.     [place] Constable, LPN   [id number]/93/[id number]   Nurse Notes: Home health called last month 05/31/21 about edema in her legs - she was told to increase her Lasix to 40mg  daily - she only did this for a few days and quit - She still has trouble with edema, not as bad as last month - she is taking Lasix 20mg  now. Was she supposed to continue 40mg ?

## 2021-07-02 NOTE — Patient Instructions (Signed)
Ms. Whitehorn , Thank you for taking time to come for your Medicare Wellness Visit. I appreciate your ongoing commitment to your health goals. Please review the following plan we discussed and let me know if I can assist you in the future.   Screening recommendations/referrals: Colonoscopy: No longer required Mammogram: No longer required  Bone Density: No longer required Recommended yearly ophthalmology/optometry visit for glaucoma screening and checkup Recommended yearly dental visit for hygiene and checkup  Vaccinations: Influenza vaccine: Done 06/15/2020 - Repeat annually  Pneumococcal vaccine: Done 06/23/2012 & 10/10/2015 Tdap vaccine: Done 09/07/2018 - Repeat in 10 years  Shingles vaccine: Due   Covid-19:Done 10/28/19, 02/26/20, & 08/12/2020  Advanced directives: In chart  Conditions/risks identified: Continue fall prevention, using walker. Try to walk daily for 30 minutes - even if just 5-10 minutes at a time. Get up once every hour during the day and walk and stretch. Increase fruits and vegetables.  Next appointment: Follow up in one year for your annual wellness visit    Preventive Care 65 Years and Older, Female Preventive care refers to lifestyle choices and visits with your health care provider that can promote health and wellness. What does preventive care include? A yearly physical exam. This is also called an annual well check. Dental exams once or twice a year. Routine eye exams. Ask your health care provider how often you should have your eyes checked. Personal lifestyle choices, including: Daily care of your teeth and gums. Regular physical activity. Eating a healthy diet. Avoiding tobacco and drug use. Limiting alcohol use. Practicing safe sex. Taking low-dose aspirin every day. Taking vitamin and mineral supplements as recommended by your health care provider. What happens during an annual well check? The services and screenings done by your health care provider  during your annual well check will depend on your age, overall health, lifestyle risk factors, and family history of disease. Counseling  Your health care provider may ask you questions about your: Alcohol use. Tobacco use. Drug use. Emotional well-being. Home and relationship well-being. Sexual activity. Eating habits. History of falls. Memory and ability to understand (cognition). Work and work Astronomer. Reproductive health. Screening  You may have the following tests or measurements: Height, weight, and BMI. Blood pressure. Lipid and cholesterol levels. These may be checked every 5 years, or more frequently if you are over 56 years old. Skin check. Lung cancer screening. You may have this screening every year starting at age 33 if you have a 30-pack-year history of smoking and currently smoke or have quit within the past 15 years. Fecal occult blood test (FOBT) of the stool. You may have this test every year starting at age 79. Flexible sigmoidoscopy or colonoscopy. You may have a sigmoidoscopy every 5 years or a colonoscopy every 10 years starting at age 73. Hepatitis C blood test. Hepatitis B blood test. Sexually transmitted disease (STD) testing. Diabetes screening. This is done by checking your blood sugar (glucose) after you have not eaten for a while (fasting). You may have this done every 1-3 years. Bone density scan. This is done to screen for osteoporosis. You may have this done starting at age 110. Mammogram. This may be done every 1-2 years. Talk to your health care provider about how often you should have regular mammograms. Talk with your health care provider about your test results, treatment options, and if necessary, the need for more tests. Vaccines  Your health care provider may recommend certain vaccines, such as: Influenza vaccine. This is recommended  every year. Tetanus, diphtheria, and acellular pertussis (Tdap, Td) vaccine. You may need a Td booster every  10 years. Zoster vaccine. You may need this after age 60. Pneumococcal 13-valent conjugate (PCV13) vaccine. One dose is recommended after age 74. Pneumococcal polysaccharide (PPSV23) vaccine. One dose is recommended after age 29. Talk to your health care provider about which screenings and vaccines you need and how often you need them. This information is not intended to replace advice given to you by your health care provider. Make sure you discuss any questions you have with your health care provider. Document Released: 10/06/2015 Document Revised: 05/29/2016 Document Reviewed: 07/11/2015 Elsevier Interactive Patient Education  2017 [place] Prevention in the Home Falls can cause injuries. They can happen to people of all ages. There are many things you can do to make your home safe and to help prevent falls. What can I do on the outside of my home? Regularly fix the edges of walkways and driveways and fix any cracks. Remove anything that might make you trip as you walk through a door, such as a raised step or threshold. Trim any bushes or trees on the path to your home. Use bright outdoor lighting. Clear any walking paths of anything that might make someone trip, such as rocks or tools. Regularly check to see if handrails are loose or broken. Make sure that both sides of any steps have handrails. Any raised decks and porches should have guardrails on the edges. Have any leaves, snow, or ice cleared regularly. Use sand or salt on walking paths during winter. Clean up any spills in your garage right away. This includes oil or grease spills. What can I do in the bathroom? Use night lights. Install grab bars by the toilet and in the tub and shower. Do not use towel bars as grab bars. Use non-skid mats or decals in the tub or shower. If you need to sit down in the shower, use a plastic, non-slip stool. Keep the floor dry. Clean up any water that spills on the floor as soon as it  happens. Remove soap buildup in the tub or shower regularly. Attach bath mats securely with double-sided non-slip rug tape. Do not have throw rugs and other things on the floor that can make you trip. What can I do in the bedroom? Use night lights. Make sure that you have a light by your bed that is easy to reach. Do not use any sheets or blankets that are too big for your bed. They should not hang down onto the floor. Have a firm chair that has side arms. You can use this for support while you get dressed. Do not have throw rugs and other things on the floor that can make you trip. What can I do in the kitchen? Clean up any spills right away. Avoid walking on wet floors. Keep items that you use a lot in easy-to-reach places. If you need to reach something above you, use a strong step stool that has a grab bar. Keep electrical cords out of the way. Do not use floor polish or wax that makes floors slippery. If you must use wax, use non-skid floor wax. Do not have throw rugs and other things on the floor that can make you trip. What can I do with my stairs? Do not leave any items on the stairs. Make sure that there are handrails on both sides of the stairs and use them. Fix handrails that are broken  or loose. Make sure that handrails are as long as the stairways. Check any carpeting to make sure that it is firmly attached to the stairs. Fix any carpet that is loose or worn. Avoid having throw rugs at the top or bottom of the stairs. If you do have throw rugs, attach them to the floor with carpet tape. Make sure that you have a light switch at the top of the stairs and the bottom of the stairs. If you do not have them, ask someone to add them for you. What else can I do to help prevent falls? Wear shoes that: Do not have high heels. Have rubber bottoms. Are comfortable and fit you well. Are closed at the toe. Do not wear sandals. If you use a stepladder: Make sure that it is fully opened.  Do not climb a closed stepladder. Make sure that both sides of the stepladder are locked into place. Ask someone to hold it for you, if possible. Clearly mark and make sure that you can see: Any grab bars or handrails. First and last steps. Where the edge of each step is. Use tools that help you move around (mobility aids) if they are needed. These include: Canes. Walkers. Scooters. Crutches. Turn on the lights when you go into a dark area. Replace any light bulbs as soon as they burn out. Set up your furniture so you have a clear path. Avoid moving your furniture around. If any of your floors are uneven, fix them. If there are any pets around you, be aware of where they are. Review your medicines with your doctor. Some medicines can make you feel dizzy. This can increase your chance of falling. Ask your doctor what other things that you can do to help prevent falls. This information is not intended to replace advice given to you by your health care provider. Make sure you discuss any questions you have with your health care provider. Document Released: 07/06/2009 Document Revised: 02/15/2016 Document Reviewed: 10/14/2014 Elsevier Interactive Patient Education  2017 [employer].

## 2021-07-03 ENCOUNTER — Telehealth: Payer: Self-pay

## 2021-07-03 NOTE — Telephone Encounter (Signed)
(  3:15 pm) SW scheduled palliative care visit with patient. RN/SW to visit patient on 07/27/21 @10am .

## 2021-07-04 ENCOUNTER — Telehealth: Payer: Self-pay

## 2021-07-04 NOTE — Telephone Encounter (Signed)
PA request was received from (pharmacy): [facility]  Phone:[phone] Fax: [phone] Medication name and strength: Albuterol Sulfate 108(90 base) MCG  Ordering Provider: Levy Pupa  Was PA started with [facility]?: yes If yes, please enter KEY: [id number] Medication tried and failed: Breztri Proventil trelegy proair spiriva symbicort  stiolto    PA sent to plan, time frame for approval / denial Waiting for Approval  Routing to [facility] triage for follow-up

## 2021-07-05 MED ORDER — ALBUTEROL SULFATE HFA 108 (90 BASE) MCG/ACT IN AERS: 2.0000 | INHALATION_SPRAY | Freq: Four times a day (QID) | RESPIRATORY_TRACT | 11 refills | Status: DC | PRN

## 2021-07-05 NOTE — Telephone Encounter (Signed)
I called and spoke with [facility] and they said send rx for proair and they will provide pt with generic for this which is covered. Nothing further needed.

## 2021-07-05 NOTE — Telephone Encounter (Signed)
We should use whatever formulary version of albuterol HFA they will approve, except for proventil which she has failed.

## 2021-07-05 NOTE — Telephone Encounter (Signed)
Hi Dr Delton Coombes, Prior Auth for Albuterol Sulfate was denied. Would you like me to send an appeal letter or would  you like to find a different alternative?

## 2021-07-09 ENCOUNTER — Telehealth: Payer: Self-pay | Admitting: Nurse Practitioner

## 2021-07-09 ENCOUNTER — Telehealth: Payer: Self-pay | Admitting: Emergency Medicine

## 2021-07-09 DIAGNOSIS — L03116 Cellulitis of left lower limb: Secondary | ICD-10-CM | POA: Insufficient documentation

## 2021-07-09 NOTE — Telephone Encounter (Signed)
Called and spoke with patient's son Arlys John. He stated that he was in the process of getting his mother ready to go to an UC. I advised him that most UC do not have the equipment to do dopplers so it is best for her to go to the ED as RB recommended. He verbalized understanding and will take her to the ED in [place].   Nothing further needed at time of call.

## 2021-07-09 NOTE — Telephone Encounter (Signed)
Patient states her left leg is warm to touch, red, and swollen and she has been told he probably has a blood clot. Patient asked if we could call in medication for her instead of her going to ER.  I advised patient that she should go immediately to the ER.  Patient agrees and is calling son to take her.

## 2021-07-09 NOTE — Telephone Encounter (Signed)
Spoke with pt who states lower left leg is swollen pain warm and red. Pt states that she knows she has a blood clot because she has been reading about it. Pt states this has been going on since Saturday. RN strongly encouraged and instructed her that she needed emergent  medical evaluation. Pt was hesitant as she did not want to come all the way to [facility] so RN suggested EMS services. RN also inquired if she had spoken with her PCP. Pt stated she had not. Pt did state she has been taking ASA since Saturday and tried to limit movement. Pt was again instructed to seek emergent medical treatment.  Dr. Delton Coombes please advise.

## 2021-07-09 NOTE — Telephone Encounter (Signed)
She does need to be seen urgently to facilitate LE doppler US studies, start anticoagulation if indicated. We should not start anticoagulation on her without confirming the presence of clot. STRONGLY recommend that she go to ED. If she will not, then we can order LE doppler US to guide treatment, but there is risk in doing this at the outpatient level.

## 2021-07-13 ENCOUNTER — Telehealth: Payer: Self-pay

## 2021-07-13 NOTE — Telephone Encounter (Signed)
Transition Care Management Follow-up Telephone Call Date of discharge and from where: 07/12/2021 - [facility] Diagnosis: cellulitis left leg How have you been since you were released from the hospital? She feels good, but her leg is still warm to touch and swollen - she has been resting and elevating it today Any questions or concerns? No  Items Reviewed: Did the pt receive and understand the discharge instructions provided? Yes  Medications obtained and verified? Yes  Other? No  Any new allergies since your discharge? No  Dietary orders reviewed? Yes Do you have support at home?  She lives alone, but has a son and neighbor who check on her daily - another son who comes to take care of her outdoor chores, and daughter once a week comes to clean and do laundry - she doesn't feel that she needs HH or any other assistane  Home Care and Equipment/Supplies: Were home health services ordered? no  Were any new equipment or medical supplies ordered?  No - she already has side rails for bed, bedside commode, walker, oxygen, etc. What is the name of the medical supply agency? [employer]  Functional Questionnaire: (I = Independent and D = Dependent) ADLs: I  Bathing/Dressing- I  Meal Prep- I  Eating- I  Maintaining continence- I  Transferring/Ambulation- I  Managing Meds- I  Follow up appointments reviewed:  PCP Hospital f/u appt confirmed? Yes  Scheduled to see Gennette Pac on 07/23/21 @ 12:15. Specialist Hospital f/u appt confirmed? No   Are transportation arrangements needed? No  If their condition worsens, is the pt aware to call PCP or go to the Emergency Dept.? Yes Was the patient provided with contact information for the PCP's office or ED? Yes Was to pt encouraged to call back with questions or concerns? Yes

## 2021-07-20 ENCOUNTER — Ambulatory Visit: Payer: Self-pay | Admitting: Nurse Practitioner

## 2021-07-23 ENCOUNTER — Encounter: Payer: Self-pay | Admitting: Nurse Practitioner

## 2021-07-23 ENCOUNTER — Ambulatory Visit (INDEPENDENT_AMBULATORY_CARE_PROVIDER_SITE_OTHER): Payer: Medicare Other | Admitting: Nurse Practitioner

## 2021-07-23 ENCOUNTER — Other Ambulatory Visit: Payer: Self-pay

## 2021-07-23 VITALS — BP 163/75 | HR 83 | Temp 97.8°F | Resp 20 | Ht 66.0 in | Wt 193.0 lb

## 2021-07-23 DIAGNOSIS — Z23 Encounter for immunization: Secondary | ICD-10-CM | POA: Diagnosis not present

## 2021-07-23 DIAGNOSIS — L03116 Cellulitis of left lower limb: Secondary | ICD-10-CM | POA: Diagnosis not present

## 2021-07-23 DIAGNOSIS — R609 Edema, unspecified: Secondary | ICD-10-CM | POA: Diagnosis not present

## 2021-07-23 MED ORDER — FUROSEMIDE 20 MG PO TABS: 40.0000 mg | ORAL_TABLET | Freq: Every day | ORAL | 0 refills | Status: DC

## 2021-07-23 NOTE — Progress Notes (Signed)
   Subjective:    Patient ID: Danielle Harmon, female    DOB: [birth year], 80 y.o.   MRN: [id number]  HPI Today's visit was for Transitional Care Management.  The patient was discharged from [facility] on 07/12/21 with a primary diagnosis of cellulitis of left leg.   Contact with the patient and/or caregiver, by a clinical staff member, was made on 07/13/21 and was documented as a telephone encounter within the EMR.  Through chart review and discussion with the patient I have determined that management of their condition is of low complexity.   Patient was sent on antibiotics, cleocin and has since taken them all. Her legs are still swollen but no longer red. Sh ehas SOB but no more then usual. She is on oxygen at home.      Review of Systems  Constitutional: Negative.   Respiratory:  Positive for shortness of breath (no more then usual).   Cardiovascular:  Positive for leg swelling.  Neurological: Negative.   Psychiatric/Behavioral: Negative.    All other systems reviewed and are negative.     Objective:   Physical Exam Vitals and nursing note reviewed.  Constitutional:      Appearance: Normal appearance. She is obese.  Cardiovascular:     Rate and Rhythm: Normal rate and regular rhythm.     Heart sounds: Normal heart sounds.  Pulmonary:     Comments: Diminished breathe sounds throughout Fine crackels in lower bases bil. Musculoskeletal:     Right lower leg: Edema (3+) present.     Left lower leg: Edema (3+) present.  Skin:    General: Skin is warm.  Neurological:     Mental Status: She is alert.   BP (!) 163/75   Pulse 83   Temp 97.8 F (36.6 C) (Temporal)   Resp 20   Ht 5\' 6"  (1.676 m)   Wt 193 lb (87.5 kg)   SpO2 97% Comment: 4 liters O2  BMI 31.15 kg/m         Assessment & Plan:   Danielle Harmon in today with chief complaint of Transitions Of Care   1. Peripheral edema Elevate legs Increase lasix to 40mg  daily Unna boots Recheck in 3 days.    The  above assessment and management plan was discussed with the patient. The patient verbalized understanding of and has agreed to the management plan. Patient is aware to call the clinic if symptoms persist or worsen. Patient is aware when to return to the clinic for a follow-up visit. Patient educated on when it is appropriate to go to the emergency department.   Danielle Danielle Dana, FNP

## 2021-07-23 NOTE — Patient Instructions (Signed)
Unna Boot Care An Unna boot is a type of bandage (dressing) for the foot and leg. The dressing is a gauze wrap that is soaked with a type of medicine called zinc oxide. The gauze may also include other lotions and medicines that help in wound healing, such as calamine. An Unna boot may be used to treat: Open sores (ulcers) on the foot, heel, or leg. Swelling from disorders that affect the veins or lymphatic system (lymphedema). Skin conditions such as chronic inflammation caused by poor blood flow (stasis dermatitis). The dressing is applied by a health care provider. The gauze is wrapped around your lower extremity in several layers, usually starting at the toes and going upward to the knee. A dry outer wrap goes over the medicated wrap for support and compression.  Before applying the Unna boot, your health care provider will clean your leg and foot and may apply an antibiotic ointment. You may be asked to raise (elevate) your leg for a while to reduce swelling before the boot is applied. The boot will dry and harden after it is applied. The boot may need to be changed or replaced about twice a week. Follow these instructions at home: Boot care Wear the Unna boot as told by your health care provider. You may need to wear a slipper or shoe over the boot that is one or two sizes larger than normal. Check the skin around the boot every day. Tell your health care provider about any concerns. Do not stick anything inside the boot to scratch your skin. Doing that increases your risk of infection. Keep your Unna boot clean and dry. Check every day for signs of infection. Check for: Redness, swelling, or pain in your foot or toes. Fluid or blood coming from the boot. Pus or a bad smell coming from the boot. Remove the boot and call your health care provider if you have signs of poor blood flow, such as: Your toes tingle or become numb. Your toes turn cold or turn blue or pale. Your toes are more  swollen or painful. You are unable to move your toes. Activity You may walk with the boot once it has dried. Ask your health care provider how much walking is safe for you. Avoid sitting for a long time without moving. Get up to take short walks as told by your health care provider. This is important to improve blood flow. Bathing Do not take baths, swim, or use a hot tub until your health care provider approves. Ask your health care provider if you may take showers. If your health care provider approves a bath or a shower, do not let the Unna boot get wet. If you take a shower, cover the boot with a watertight covering. If you take a bath, keep your leg with the boot out of the tub. General instructions Keep your leg elevated above the level of your heart while you are sitting or lying down. This will decrease swelling. Do not sit with your knee bent for long periods of time. Take over-the-counter and prescription medicines only as told by your health care provider. Do not use any products that contain nicotine or tobacco, such as cigarettes, e-cigarettes, and chewing tobacco. These can delay healing. If you need help quitting, ask your health care provider. Keep all follow-up visits as told by your health care provider. This is important. Contact a health care provider if: Your skin feels itchy inside the boot. You have a burning sensation, a   rash, or itchy, red, swollen areas of skin (hives) in the boot area. You have a fever or chills. You have any signs of infection, such as: New redness, swelling, or pain. More fluid or blood coming from the boot. Pus or a bad smell coming from the boot. You have increased numbness or pain in your foot or toes. You have any changes in skin color on your foot or toes, such as the skin turning blue or pale or developing patchy areas with spots. Your boot has been damaged or feels like it is no longer fitting properly. Summary An Unna boot is a type of  bandage (dressing) system for the foot and leg. The dressing is a gauze wrap that is soaked with a type of medicine (zinc oxide) to treat foot, heel, or leg ulcers, swelling from disorders that affect the veins or lymphatic system (lymphedema), and skin conditions caused by poor blood flow (stasis dermatitis). This dressing is applied by a health care provider. After it is applied, the boot will dry and harden. The boot may need to be changed or replaced about twice a week. Let your health care provider know if you have any signs of poor blood flow or infection. This information is not intended to replace advice given to you by your health care provider. Make sure you discuss any questions you have with your health care provider. Document Revised: 12/29/2018 Document Reviewed: 05/20/2018 Elsevier Patient Education  2022 Elsevier Inc.  

## 2021-07-26 ENCOUNTER — Other Ambulatory Visit: Payer: Self-pay

## 2021-07-26 ENCOUNTER — Other Ambulatory Visit: Payer: Medicare Other

## 2021-07-26 ENCOUNTER — Encounter: Payer: Self-pay | Admitting: Nurse Practitioner

## 2021-07-26 ENCOUNTER — Other Ambulatory Visit: Payer: Medicare Other | Admitting: *Deleted

## 2021-07-26 ENCOUNTER — Ambulatory Visit (INDEPENDENT_AMBULATORY_CARE_PROVIDER_SITE_OTHER): Payer: Medicare Other | Admitting: Nurse Practitioner

## 2021-07-26 VITALS — BP 175/91 | HR 100 | Temp 97.3°F | Ht 66.0 in | Wt 190.0 lb

## 2021-07-26 DIAGNOSIS — Z515 Encounter for palliative care: Secondary | ICD-10-CM

## 2021-07-26 DIAGNOSIS — L03116 Cellulitis of left lower limb: Secondary | ICD-10-CM | POA: Diagnosis not present

## 2021-07-26 MED ORDER — FLUCONAZOLE 150 MG PO TABS: 150.0000 mg | ORAL_TABLET | Freq: Once | ORAL | 0 refills | Status: AC

## 2021-07-26 MED ORDER — SULFAMETHOXAZOLE-TRIMETHOPRIM 800-160 MG PO TABS: 1.0000 | ORAL_TABLET | Freq: Two times a day (BID) | ORAL | 0 refills | Status: DC

## 2021-07-26 NOTE — Progress Notes (Signed)
COMMUNITY PALLIATIVE CARE SW NOTE  PATIENT NAME: Danielle Harmon DOB: 02-17-1941 MRN: [id number]  PRIMARY CARE PROVIDER: Bennie Pierini, FNP  RESPONSIBLE PARTY:  Acct ID - Guarantor Home Phone Work Phone Relationship Acct Type  [id number] [name] [phone]  Self P/F     [address], [place], [place] [address]     PLAN OF CARE and INTERVENTIONS:             GOALS OF CARE/ ADVANCE CARE PLANNING:  Goal is for patient to remain in her home as independent  and safe as possible. Patient is a DNR.  SOCIAL/EMOTIONAL/SPIRITUAL ASSESSMENT/ INTERVENTIONS:  SW and RN-M. Dimas Aguas completed a follow-up visit with patient at her home. She was present with her daughter and granddaughter. Patient was observed sitting on her couch with her feet/legs wrapped and elevated. Patient and her daughter commented on how much her swelling has gone down. An additional dosage of lasix along with elevating her legs have been effective with decreasing her swelling. Patient denies pain. She reports having increased congestion with a productive cough (white phlegm observed). Her appetite remains good as she is eating small meals and snacks throughout the day. Patient rolled off the couch last week and used to her life alert button to get help getting up. She continues to have shortness of breath with exertion. She wears her o2 continuously. She turns it up to 6 L at rest and she uses 4 L at rest. Patient remains independent for all ADL's. Patient remains cordial, engaged and open to ongoing supportive visits by the palliative care team.  PATIENT/CAREGIVER EDUCATION/ COPING:  Patient appears to be coping well. Her family is very supportive of patient.  PERSONAL EMERGENCY PLAN:  Patient wears a Life Alert button.  COMMUNITY RESOURCES COORDINATION/ HEALTH CARE NAVIGATION:  None. FINANCIAL/LEGAL CONCERNS/INTERVENTIONS:  None.     SOCIAL HX:  Social History   Tobacco Use   Smoking status: Former    Packs/day: 1.00     Years: 40.00    Pack years: 40.00    Types: Cigarettes    Quit date: 09/23/2002    Years since quitting: 18.8   Smokeless tobacco: Never  Substance Use Topics   Alcohol use: Yes    Comment: occ    CODE STATUS: DNR ADVANCED DIRECTIVES: No MOST FORM COMPLETE:  Yes HOSPICE EDUCATION PROVIDED: No  PPS: Patient is alert and oriented x3, but forgetful. She ambulates with a rolator. She is dependent on o2 continuous.   Duration of visit and documentation: 60 minutes.       [address] [place], [place]

## 2021-07-26 NOTE — Progress Notes (Signed)
   Subjective:    Patient ID: Danielle Harmon, female    DOB: March 21, 1941, 80 y.o.   MRN: [id number]   Chief Complaint: venous stasis  HPI Patient was seen on 07/23/21 with venous stasis in both lower ext. We increased her lasix to 40mg  daily and applied unna boots. Sh esyas she has been voiding a lot. Denies any SOB. Weight is down 3 lbs.  Wt Readings from Last 3 Encounters:  07/26/21 190 lb (86.2 kg)  07/23/21 193 lb (87.5 kg)  07/02/21 195 lb (88.5 kg)         Review of Systems  Respiratory:  Negative for chest tightness and shortness of breath.   Cardiovascular:  Positive for leg swelling. Negative for chest pain and palpitations.  All other systems reviewed and are negative.     Objective:   Physical Exam Constitutional:      Appearance: Normal appearance. She is obese.  Cardiovascular:     Rate and Rhythm: Normal rate and regular rhythm.  Pulmonary:     Breath sounds: Rales (bil bases) present.  Musculoskeletal:     Right lower leg: Edema (1+) present.     Left lower leg: Edema (1+mild erythema and warm to touch) present.  Neurological:     General: No focal deficit present.     Mental Status: She is alert and oriented to person, place, and time.  Psychiatric:        Behavior: Behavior normal.   BP (!) 175/91   Pulse 100   Temp (!) 97.3 F (36.3 C) (Temporal)   Ht 5\' 6"  (1.676 m) Comment: In wheelchair  Wt 190 lb (86.2 kg) Comment: In wheelchair  BMI 30.67 kg/m         Assessment & Plan:  Danielle Harmon in today with chief complaint of No chief complaint on file.   1. Cellulitis of left lower extremity Elevate legs when sitting Compression socks Continue lasix at 40 mg Meds ordered this encounter  Medications   sulfamethoxazole-trimethoprim (BACTRIM DS) 800-160 MG tablet    Sig: Take 1 tablet by mouth 2 (two) times daily.    Dispense:  20 tablet    Refill:  0    Order Specific Question:   Supervising Provider    Answer:   A  [[id number]]       The above assessment and management plan was discussed with the patient. The patient verbalized understanding of and has agreed to the management plan. Patient is aware to call the clinic if symptoms persist or worsen. Patient is aware when to return to the clinic for a follow-up visit. Patient educated on when it is appropriate to go to the emergency department.   Mary-Margaret Danielle Dana, FNP

## 2021-07-31 ENCOUNTER — Other Ambulatory Visit: Payer: Self-pay | Admitting: Emergency Medicine

## 2021-07-31 DIAGNOSIS — J438 Other emphysema: Secondary | ICD-10-CM

## 2021-09-05 ENCOUNTER — Other Ambulatory Visit: Payer: Self-pay | Admitting: Nurse Practitioner

## 2021-09-05 DIAGNOSIS — I10 Essential (primary) hypertension: Secondary | ICD-10-CM

## 2021-09-05 DIAGNOSIS — K219 Gastro-esophageal reflux disease without esophagitis: Secondary | ICD-10-CM

## 2021-09-22 NOTE — Progress Notes (Signed)
[facility] P[facility]IATIVE C[facility] RN NOTE  PATIENT NAME: Danielle Harmon DOB: 15-Jul-1941 MRN: [id number]  PRIMARY CARE PROVIDER: Chevis Pretty, FNP  RESPONSIBLE PARTY:  Acct ID - Guarantor Home Phone Work Phone Relationship Acct Type  [id number] [address] [phone]  Self P/F     [place], [place], [place] [address]   Covid-19 Pre-screening Negative  PLAN OF CARE and INTERVENTION:  ADVANCE CARE PLANNING/GOALS OF CARE: Goal is for patient to remain in her home. She has a DNR. PATIENT/CAREGIVER EDUCATION: Symptom management, edema management, safe mobility DISEASE STATUS: Joint follow-up palliative care meeting made with LCSW, M. Lonon. Met with patient, daughter and granddaughter. Upon arrival, patient is sitting up on her couch awake and alert. She remains able to engage in appropriate conversation. She was recently hospitalized from 07/09/21 to 07/12/21 for left leg cellulitis. She says that her leg is much better. She continues with some lower extremity edema, however this has improved since they have been wrapped and she is elevating them more. She has an appointment with her PCP today. She remains ambulatory using her walker. She is O2 dependent. She uses 4L at rest but will increase up to 6L with exertion. She does have a productive cough with thick white phlegm. Her appetite is good and she is eating several small meals throughout the day. She is ambulatory using her walker. She rolled off her couch last week and had to use her Life Alert button to call for assistance. No apparent injuries. Will continue to monitor.  HISTORY OF PRESENT ILLNESS:   This is a 80 yo female with a diagnosis of COPD. She has a history of hypertension, GERD and peripheral edema. Palliative care team continues to follow patient for symptom management, goals of care and complex decision making  CODE STATUS: DNR ADVANCED DIRECTIVES: Y MOST FORM: yes PPS: 60%   (Duration of visit and  documentation 60 minutes)   Daryl Eastern, RN BSN

## 2021-09-29 ENCOUNTER — Other Ambulatory Visit: Payer: Self-pay | Admitting: Emergency Medicine

## 2021-09-29 DIAGNOSIS — J438 Other emphysema: Secondary | ICD-10-CM

## 2021-11-02 ENCOUNTER — Other Ambulatory Visit: Payer: Self-pay | Admitting: Emergency Medicine

## 2021-11-02 DIAGNOSIS — J438 Other emphysema: Secondary | ICD-10-CM

## 2021-12-02 ENCOUNTER — Other Ambulatory Visit: Payer: Self-pay | Admitting: Nurse Practitioner

## 2021-12-02 DIAGNOSIS — I10 Essential (primary) hypertension: Secondary | ICD-10-CM

## 2021-12-21 ENCOUNTER — Ambulatory Visit (INDEPENDENT_AMBULATORY_CARE_PROVIDER_SITE_OTHER): Payer: Medicare Other | Admitting: Emergency Medicine

## 2021-12-21 ENCOUNTER — Encounter: Payer: Self-pay | Admitting: Emergency Medicine

## 2021-12-21 DIAGNOSIS — J9611 Chronic respiratory failure with hypoxia: Secondary | ICD-10-CM

## 2021-12-21 DIAGNOSIS — J438 Other emphysema: Secondary | ICD-10-CM

## 2021-12-21 NOTE — Assessment & Plan Note (Signed)
Continue your oxygen 4-6 L/min ?

## 2021-12-21 NOTE — Assessment & Plan Note (Signed)
Please continue Breztri 2 puffs twice a day.  Rinse and gargle after using. ?Keep your albuterol available to use 2 puffs when you needed for shortness of breath, chest tightness, wheezing. ?We will hold off on restarting prednisone at this time.  Please let us know if your breathing changes and we will consider whether this would be helpful. ?Video visit in 6 months ?Follow Dr. Delton Coombes in person in 12 months ?Call if you have any new issues or problems. ?

## 2021-12-21 NOTE — Patient Instructions (Signed)
Please continue Breztri 2 puffs twice a day.  Rinse and gargle after using. ?Keep your albuterol available to use 2 puffs when you needed for shortness of breath, chest tightness, wheezing. ?We will hold off on restarting prednisone at this time.  Please let us know if your breathing changes and we will consider whether this would be helpful. ?Continue your oxygen 4-6 L/min ?Video visit in 6 months ?Follow Dr. Delton Coombes in person in 12 months ?Call if you have any new issues or problems. ?

## 2021-12-21 NOTE — Progress Notes (Signed)
? ?  Subjective:  ? ? Patient ID: Danielle Harmon, female    DOB: 09-17-1941, 81 y.o.   MRN: [id number] ? ?HPI ? ?ROV 02/08/21 --follow-up visit for 81 year old woman with a history of severe COPD and associated chronic hypoxemic respiratory failure.  She has support from palliative care.  She also deals with allergic rhinitis.  She is on oxygen at 4-6 L/min.  Maintenance medication is Breztri, prednisone 5 mg daily.  She has albuterol which she uses approximately 4x a week. Usually when she is about to exert herself. She has a lot of LE edema - just had her lasix increased to 20mg  bid x 4 days ?Mucinex twice a day. She has some daily cough,  ? ?ROV 12/21/21 --Danielle Harmon follows up today.  She is 67 and has a history of very severe COPD with associated chronic hypoxemic respiratory failure, allergic rhinitis.  She uses oxygen 4-6 L/min.  She had palliative care support, but no longer connected.  CODE STATUS DNR/DNI.  She has been managed on Breztri, formerly on prednisone 5 mg daily but stopped this a few months ago - sounds like it was not refilled by [place]. She is unclear whether she missed it.  Uses albuterol quite rarely.  ?She is very limited with regard to her activity.  ? ?Review of Systems ?As per HPI ? ?   ?Objective:  ? Physical Exam ? ?Vitals:  ? 12/21/21 1014  ?BP: 136/72  ?Pulse: 84  ?Temp: 98.4 ?F (36.9 ?C)  ?TempSrc: Oral  ?SpO2: 98%  ?Weight: 185 lb 8 oz (84.1 kg)  ?Height: 5\' 4"  (1.626 m)  ? ? ?Gen: Pleasant, well-nourished, in no distress in wheelchair,  normal affect, O2 in place ? ?ENT: No lesions,  mouth clear,  oropharynx clear, no postnasal drip ? ?Neck: No JVD, no stridor ? ?Lungs: No use of accessory muscles, very distant, end expiratory wheezes bilaterally ? ?Cardiovascular: RRR, heart sounds normal, no murmur or gallops, 2+ LE peripheral edema ? ?Musculoskeletal: No deformities, no cyanosis or clubbing ? ?Neuro: alert, non focal ? ?Skin: Warm, no lesions or rashes ? ? ?   ?Assessment & Plan:  ?COPD  (chronic obstructive pulmonary disease) with emphysema ([place]) ?Please continue Breztri 2 puffs twice a day.  Rinse and gargle after using. ?Keep your albuterol available to use 2 puffs when you needed for shortness of breath, chest tightness, wheezing. ?We will hold off on restarting prednisone at this time.  Please let us know if your breathing changes and we will consider whether this would be helpful. ?Video visit in 6 months ?Follow Dr. Lamonte Sakai in person in 12 months ?Call if you have any new issues or problems. ? ?Chronic respiratory failure with hypoxia ([place]) ?Continue your oxygen 4-6 L/min ? ?Baltazar Apo, MD, PhD ?12/21/2021, 10:31 AM ?[facility] ?[phone] or if no answer [phone] ? ?

## 2021-12-23 ENCOUNTER — Other Ambulatory Visit: Payer: Self-pay | Admitting: Nurse Practitioner

## 2021-12-23 DIAGNOSIS — R609 Edema, unspecified: Secondary | ICD-10-CM

## 2021-12-24 NOTE — Telephone Encounter (Signed)
One refill given. Patient will need an appointment in May. ?

## 2021-12-24 NOTE — Telephone Encounter (Signed)
Appt made for 5/5 

## 2022-01-01 ENCOUNTER — Other Ambulatory Visit: Payer: Self-pay | Admitting: Nurse Practitioner

## 2022-01-01 DIAGNOSIS — I10 Essential (primary) hypertension: Secondary | ICD-10-CM

## 2022-01-12 ENCOUNTER — Other Ambulatory Visit: Payer: Self-pay | Admitting: Nurse Practitioner

## 2022-01-12 DIAGNOSIS — I10 Essential (primary) hypertension: Secondary | ICD-10-CM

## 2022-01-16 ENCOUNTER — Other Ambulatory Visit: Payer: Medicare Other

## 2022-01-16 DIAGNOSIS — Z515 Encounter for palliative care: Secondary | ICD-10-CM

## 2022-01-16 NOTE — Progress Notes (Signed)
COMMUNITY PALLIATIVE CARE SW NOTE ? ?PATIENT NAME: Danielle Harmon ?DOB: 11/02/1940 ?MRN: [id number] ? ?PRIMARY CARE PROVIDER: Bennie Pierini, FNP ? ?RESPONSIBLE PARTY:  ?Acct ID - Guarantor Home Phone Work Phone Relationship Acct Type  ?[id number] [name] [phone]  Self P/F  ?   [address] [place], [place] [address]  ? ?Due to the COVID-19 crisis, this virtual check-in visit was done via telephone from my office and it was initiated and consent by this patient and or family. ? ?SOCIAL WORK TELEPHONIC ENCOUNTER  (1 pm- 1:25 pm) ? ?PC SW completed a telephonic visit with patient to assess her status, needs and comfort. Patient reported that she continues to have shortness of breath with any exertion. She reports that she spends her days watching TV, looking at her IPad and Facebook. She remains on 4 L of o2 continuously. She does have swelling in her ankles. Patient report that she keeps her feet elevated and she wears compression stockings. Patient report that she is doing fair overall. She declined to see a provider at this time, but requested a follow-up in 6/8 weeks.  ?Patient is scheduled to see her PCP on May 5th.  ?No other concerns noted. ? ?Clydia Llano, LCSW ? ?

## 2022-01-25 ENCOUNTER — Encounter: Payer: Self-pay | Admitting: Nurse Practitioner

## 2022-01-25 ENCOUNTER — Ambulatory Visit (INDEPENDENT_AMBULATORY_CARE_PROVIDER_SITE_OTHER): Payer: Medicare Other | Admitting: Nurse Practitioner

## 2022-01-25 VITALS — BP 146/82 | HR 80 | Temp 97.7°F | Resp 20 | Wt 185.0 lb

## 2022-01-25 DIAGNOSIS — Z6831 Body mass index (BMI) 31.0-31.9, adult: Secondary | ICD-10-CM

## 2022-01-25 DIAGNOSIS — I10 Essential (primary) hypertension: Secondary | ICD-10-CM | POA: Diagnosis not present

## 2022-01-25 DIAGNOSIS — R609 Edema, unspecified: Secondary | ICD-10-CM

## 2022-01-25 DIAGNOSIS — K219 Gastro-esophageal reflux disease without esophagitis: Secondary | ICD-10-CM

## 2022-01-25 DIAGNOSIS — J438 Other emphysema: Secondary | ICD-10-CM | POA: Diagnosis not present

## 2022-01-25 DIAGNOSIS — J9611 Chronic respiratory failure with hypoxia: Secondary | ICD-10-CM | POA: Diagnosis not present

## 2022-01-25 MED ORDER — AMLODIPINE BESYLATE 5 MG PO TABS: 5.0000 mg | ORAL_TABLET | Freq: Every day | ORAL | 1 refills | Status: DC

## 2022-01-25 MED ORDER — FUROSEMIDE 20 MG PO TABS: 40.0000 mg | ORAL_TABLET | Freq: Every day | ORAL | 1 refills | Status: DC

## 2022-01-25 MED ORDER — BREZTRI AEROSPHERE 160-9-4.8 MCG/ACT IN AERO: INHALATION_SPRAY | RESPIRATORY_TRACT | 5 refills | Status: DC

## 2022-01-25 MED ORDER — POTASSIUM CHLORIDE ER 10 MEQ PO TBCR: 10.0000 meq | EXTENDED_RELEASE_TABLET | Freq: Two times a day (BID) | ORAL | 1 refills | Status: DC

## 2022-01-25 MED ORDER — OMEPRAZOLE 20 MG PO CPDR: DELAYED_RELEASE_CAPSULE | ORAL | 1 refills | Status: DC

## 2022-01-25 NOTE — Patient Instructions (Signed)

## 2022-01-25 NOTE — Progress Notes (Signed)
? ?Subjective:  ? ? Patient ID: Danielle Harmon, female    DOB: 1941-04-29, 81 y.o.   MRN: [id number] ? ? ?Chief Complaint: Medical Management of Chronic Issues ?  ? ?HPI: ? ?Danielle Harmon is a 81 y.o. who identifies as a female who was assigned female at birth.  ? ?Social history: ?Lives with: by herself. Family checks on her daily ?Work history: retired ? ? ?Comes in today for follow up of the following chronic medical issues: ? ?1. Primary hypertension ?No c/o chest pain, or headaches. Does  check her blood pressure at home. Blood pressure is over 001 systolic at home. She has not taken her meds yet today. ?BP Readings from Last 3 Encounters:  ?01/25/22 (!) 148/80  ?12/21/21 136/72  ?07/26/21 (!) 175/91  ? ? ? ?2. Peripheral edema ?Has constant lower ext edema. ? ?3. Other emphysema ([place]) ?4. Chronic respiratory failure with hypoxia (HCC) ?Is on oxygen at 2l 24/7. Last saw pulmonology on 12/21/21. She was doing well at time . He was going to hold off on prednisone until she needs it, ? ?5. Gastro-esophageal reflux disease without esophagitis ?Is on omperazole. Will become symptomatic when she does not take it. ? ?6. BMI 31.0-31.9,adult ?No recent weight changes ?Wt Readings from Last 3 Encounters:  ?01/25/22 185 lb (83.9 kg)  ?12/21/21 185 lb 8 oz (84.1 kg)  ?07/26/21 190 lb (86.2 kg)  ? ?BMI Readings from Last 3 Encounters:  ?01/25/22 31.76 kg/m?  ?12/21/21 31.84 kg/m?  ?07/26/21 30.67 kg/m?  ? ? ? ? ?New complaints: ?None today ? ?Allergies  ?Allergen Reactions  ? Bee Venom Anaphylaxis  ? ?Outpatient Encounter Medications as of 01/25/2022  ?Medication Sig  ? albuterol (PROAIR HFA) 108 (90 Base) MCG/ACT inhaler Inhale 2 puffs into the lungs every 6 (six) hours as needed for wheezing or shortness of breath.  ? albuterol (PROVENTIL) (2.5 MG/3ML) 0.083% nebulizer solution Take 3 mLs (2.5 mg total) by nebulization every 6 (six) hours as needed for wheezing or shortness of breath. Pt may use every 4 to 6 hours as needed  ?  amLODipine (NORVASC) 5 MG tablet TAKE 1 TABLET (5 MG TOTAL) BY MOUTH DAILY.  ? Budeson-Glycopyrrol-Formoterol (BREZTRI AEROSPHERE) 160-9-4.8 MCG/ACT AERO TAKE 2 PUFFS BY MOUTH TWICE A DAY  ? diclofenac Sodium (VOLTAREN) 1 % GEL Apply topically.  ? furosemide (LASIX) 20 MG tablet TAKE 2 TABLETS (40 MG TOTAL) BY MOUTH DAILY.  ? guaiFENesin (MUCINEX) 600 MG 12 hr tablet Take 600 mg by mouth as needed.   ? omeprazole (PRILOSEC) 20 MG capsule TAKE 1 CAPSULE BY MOUTH EVERY DAY  ? potassium chloride (KLOR-CON) 10 MEQ tablet Take 1 tablet (10 mEq total) by mouth 2 (two) times daily.  ? Spacer/Aero [employer] Use as directed  ? triamcinolone (NASACORT) 55 MCG/ACT AERO nasal inhaler Place 2 sprays into the nose daily.  ? [DISCONTINUED] predniSONE (DELTASONE) 10 MG tablet TAKE 1 TABLET BY MOUTH EVERY DAY  ? [DISCONTINUED] sulfamethoxazole-trimethoprim (BACTRIM DS) 800-160 MG tablet Take 1 tablet by mouth 2 (two) times daily.  ? ?No facility-administered encounter medications on file as of 01/25/2022.  ? ? ?Past Surgical History:  ?Procedure Laterality Date  ? CHOLECYSTECTOMY    ? CYSTECTOMY    ? benign cysts removed from head and R hand  ? KNEE ARTHROSCOPY    ? Left  ? ? ?Family History  ?Problem Relation Age of Onset  ? Emphysema Father   ? COPD Father   ? Stroke Mother   ?  Diabetes Son   ? ? ? ? ?Controlled substance contract: n/a ? ? ? ? ?Review of Systems  ?Constitutional:  Negative for diaphoresis.  ?Eyes:  Negative for pain.  ?Respiratory:  Positive for shortness of breath.   ?Cardiovascular:  Negative for chest pain, palpitations and leg swelling.  ?Gastrointestinal:  Negative for abdominal pain.  ?Endocrine: Negative for polydipsia.  ?Skin:  Negative for rash.  ?Neurological:  Positive for weakness. Negative for dizziness and headaches.  ?Hematological:  Does not bruise/bleed easily.  ?All other systems reviewed and are negative. ? ?   ?Objective:  ? Physical Exam ?Vitals and nursing note reviewed.   ?Constitutional:   ?   General: She is not in acute distress. ?   Appearance: Normal appearance. She is well-developed.  ?HENT:  ?   Head: Normocephalic.  ?   Right Ear: Tympanic membrane normal.  ?   Left Ear: Tympanic membrane normal.  ?   Nose: Nose normal.  ?   Mouth/Throat:  ?   Mouth: Mucous membranes are moist.  ?Eyes:  ?   Pupils: Pupils are equal, round, and reactive to light.  ?Neck:  ?   Vascular: No carotid bruit or JVD.  ?Cardiovascular:  ?   Rate and Rhythm: Normal rate and regular rhythm.  ?   Heart sounds: Normal heart sounds.  ?Pulmonary:  ?   Effort: Pulmonary effort is normal. No respiratory distress.  ?   Breath sounds: Normal breath sounds. No wheezing or rales.  ?   Comments: oxygen at 2l via nasal cannula ?Diminished breathe sounds throughout ? ?Chest:  ?   Chest wall: No tenderness.  ?Abdominal:  ?   General: Bowel sounds are normal. There is no distension or abdominal bruit.  ?   Palpations: Abdomen is soft. There is no hepatomegaly, splenomegaly, mass or pulsatile mass.  ?   Tenderness: There is no abdominal tenderness.  ?Musculoskeletal:     ?   General: Normal range of motion.  ?   Cervical back: Normal range of motion and neck supple.  ?   Right lower leg: Edema (2+) present.  ?   Left lower leg: Edema (2+) present.  ?   Comments: in wheel chair  ?Lymphadenopathy:  ?   Cervical: No cervical adenopathy.  ?Skin: ?   General: Skin is warm and dry.  ?Neurological:  ?   Mental Status: She is alert and oriented to person, place, and time.  ?   Deep Tendon Reflexes: Reflexes are normal and symmetric.  ?Psychiatric:     ?   Behavior: Behavior normal.     ?   Thought Content: Thought content normal.     ?   Judgment: Judgment normal.  ? ? ?BP (!) 148/80   Pulse 80   Temp 97.7 ?F (36.5 ?C) (Temporal)   Resp 20   Wt 185 lb (83.9 kg) Comment: In wheelchair  SpO2 100% Comment: 4 liters  BMI 31.76 kg/m?  ? ? ? ?   ?Assessment & Plan:  ?Linda Grimmer comes in today with chief complaint of Medical  Management of Chronic Issues ? ? ?Diagnosis and orders addressed: ? ?1. Primary hypertension ?Low sodium dit ?- amLODipine (NORVASC) 5 MG tablet; Take 1 tablet (5 mg total) by mouth daily.  Dispense: 90 tablet; Refill: 1 ?- potassium chloride (KLOR-CON) 10 MEQ tablet; Take 1 tablet (10 mEq total) by mouth 2 (two) times daily.  Dispense: 180 tablet; Refill: 1 ?- CBC with Differential/Platelet ?- CMP14+EGFR ?-  Lipid panel ? ?2. Peripheral edema ?Elevate legs when sitting ?Knee high compression socks ?- furosemide (LASIX) 20 MG tablet; Take 2 tablets (40 mg total) by mouth daily.  Dispense: 180 tablet; Refill: 1 ? ?3. Other emphysema ([place]) ?Continue oxygen ?Keep follow up with pulmonology ?- Budeson-Glycopyrrol-Formoterol (BREZTRI AEROSPHERE) 160-9-4.8 MCG/ACT AERO; TAKE 2 PUFFS BY MOUTH TWICE A DAY  Dispense: 10.7 g; Refill: 5 ? ?4. Chronic respiratory failure with hypoxia (HCC) ? ?5. Gastro-esophageal reflux disease without esophagitis ?Avoid spicy foods ?Do not eat 2 hours prior to bedtime ?- omeprazole (PRILOSEC) 20 MG capsule; TAKE 1 CAPSULE BY MOUTH EVERY DAY  Dispense: 90 capsule; Refill: 1 ? ?6. BMI 31.0-31.9,adult ?Discussed diet and exercise for person with BMI >25 ?Will recheck weight in 3-6 months ? ? ? ?Labs pending ?Health Maintenance reviewed ?Diet and exercise encouraged ? ?Follow up plan: ?6 months ? ? ?Mary-Margaret Hassell Done, FNP ? ? ?

## 2022-01-26 LAB — CMP14+EGFR
ALT: 12 IU/L (ref 0–32)
AST: 17 IU/L (ref 0–40)
Albumin/Globulin Ratio: 1.4 (ref 1.2–2.2)
Albumin: 3.8 g/dL (ref 3.6–4.6)
Alkaline Phosphatase: 93 IU/L (ref 44–121)
BUN/Creatinine Ratio: 16 (ref 12–28)
BUN: 12 mg/dL (ref 8–27)
Bilirubin Total: 0.4 mg/dL (ref 0.0–1.2)
CO2: 33 mmol/L — ABNORMAL HIGH (ref 20–29)
Calcium: 9.4 mg/dL (ref 8.7–10.3)
Chloride: 95 mmol/L — ABNORMAL LOW (ref 96–106)
Creatinine, Ser: 0.75 mg/dL (ref 0.57–1.00)
Globulin, Total: 2.7 g/dL (ref 1.5–4.5)
Glucose: 103 mg/dL — ABNORMAL HIGH (ref 70–99)
Potassium: 4 mmol/L (ref 3.5–5.2)
Sodium: 140 mmol/L (ref 134–144)
Total Protein: 6.5 g/dL (ref 6.0–8.5)
eGFR: 80 mL/min/{1.73_m2} (ref >59)

## 2022-01-26 LAB — CBC WITH DIFFERENTIAL/PLATELET
Basophils Absolute: 0.1 10*3/uL (ref 0.0–0.2)
Basos: 1 %
EOS (ABSOLUTE): 0.1 10*3/uL (ref 0.0–0.4)
Eos: 1 %
Hematocrit: 39.9 % (ref 34.0–46.6)
Hemoglobin: 13.2 g/dL (ref 11.1–15.9)
Immature Grans (Abs): 0 10*3/uL (ref 0.0–0.1)
Immature Granulocytes: 0 %
Lymphocytes Absolute: 1 10*3/uL (ref 0.7–3.1)
Lymphs: 16 %
MCH: 29.6 pg (ref 26.6–33.0)
MCHC: 33.1 g/dL (ref 31.5–35.7)
MCV: 90 fL (ref 79–97)
Monocytes Absolute: 0.5 10*3/uL (ref 0.1–0.9)
Monocytes: 9 %
Neutrophils Absolute: 4.6 10*3/uL (ref 1.4–7.0)
Neutrophils: 73 %
Platelets: 209 10*3/uL (ref 150–450)
RBC: 4.46 x10E6/uL (ref 3.77–5.28)
RDW: 12.5 % (ref 11.7–15.4)
WBC: 6.3 10*3/uL (ref 3.4–10.8)

## 2022-01-26 LAB — LIPID PANEL
Chol/HDL Ratio: 2.1 ratio (ref 0.0–4.4)
Cholesterol, Total: 193 mg/dL (ref 100–199)
HDL: 94 mg/dL (ref >39)
LDL Chol Calc (NIH): 89 mg/dL (ref 0–99)
Triglycerides: 51 mg/dL (ref 0–149)
VLDL Cholesterol Cal: 10 mg/dL (ref 5–40)

## 2022-05-02 ENCOUNTER — Ambulatory Visit: Payer: Medicare Other | Admitting: Nurse Practitioner

## 2022-05-30 ENCOUNTER — Encounter: Payer: Self-pay | Admitting: Emergency Medicine

## 2022-05-30 ENCOUNTER — Ambulatory Visit (INDEPENDENT_AMBULATORY_CARE_PROVIDER_SITE_OTHER): Payer: Medicare Other | Admitting: Emergency Medicine

## 2022-05-30 DIAGNOSIS — J438 Other emphysema: Secondary | ICD-10-CM

## 2022-05-30 DIAGNOSIS — J9611 Chronic respiratory failure with hypoxia: Secondary | ICD-10-CM

## 2022-05-30 MED ORDER — BREZTRI AEROSPHERE 160-9-4.8 MCG/ACT IN AERO: INHALATION_SPRAY | RESPIRATORY_TRACT | 5 refills | Status: DC

## 2022-05-30 MED ORDER — ALBUTEROL SULFATE HFA 108 (90 BASE) MCG/ACT IN AERS: 2.0000 | INHALATION_SPRAY | Freq: Four times a day (QID) | RESPIRATORY_TRACT | 5 refills | Status: DC | PRN

## 2022-05-30 NOTE — Assessment & Plan Note (Signed)
Continue your oxygen 4-6 L/min.  We confirmed that you need this today.

## 2022-05-30 NOTE — Addendum Note (Signed)
Addended by: Dorisann Frames R on: 05/30/2022 10:48 AM   Modules accepted: Orders

## 2022-05-30 NOTE — Patient Instructions (Signed)
Please continue Breztri 2 puffs twice a day.  Rinse and gargle after using. Keep your albuterol available to use either 2 puffs or 1 nebulizer treatment when you needed for shortness of breath, chest tightness, wheezing. Continue your oxygen 4-6 L/min.  We confirmed that you need this today. We will hold off on restarting prednisone for now. Agree that you would benefit from continued palliative care support.  When they call 9/8 please have them explain which services you received, which you may qualify for.  If we need to change your orders to ensure that you get adequate support then please call our office to let us know and we will communicate with palliative care. Follow with Dr Delton Coombes in 6 months or sooner if you have any problems

## 2022-05-30 NOTE — Progress Notes (Signed)
Subjective:    Patient ID: Danielle Harmon, female    DOB: 1941/01/15, 81 y.o.   MRN: [id number]  HPI  ROV 02/08/21 --follow-up visit for 81 year old woman with a history of severe COPD and associated chronic hypoxemic respiratory failure.  She has support from palliative care.  She also deals with allergic rhinitis.  She is on oxygen at 4-6 L/min.  Maintenance medication is Breztri, prednisone 5 mg daily.  She has albuterol which she uses approximately 4x a week. Usually when she is about to exert herself. She has a lot of LE edema - just had her lasix increased to 20mg  bid x 4 days Mucinex twice a day. She has some daily cough,   ROV 12/21/21 --Danielle Harmon follows up today.  She is 81 and has a history of very severe COPD with associated chronic hypoxemic respiratory failure, allergic rhinitis.  She uses oxygen 4-6 L/min.  She had palliative care support, but no longer connected.  CODE STATUS DNR/DNI.  She has been managed on Breztri, formerly on prednisone 5 mg daily but stopped this a few months ago - sounds like it was not refilled by 96. She is unclear whether she missed it.  Uses albuterol quite rarely.  She is very limited with regard to her activity.   ROV 05/30/2022 --6 months follow-up for Danielle Harmon.  She is 81 and has a history of chronic hypoxemic respiratory failure in the setting of very severe COPD.  She is on oxygen 4-6 L/min.  Previously has had palliative care support, looks like she was last contacted 01/16/2022.  Goals of care clarified: DNR/DNI.  We have been managing her on Breztri.  She has been on maintenance prednisone in the past but not for about a year. Today she reports that she is not very active, sedentary lifestyle.  She is using oxygen 4-6 L/min depending on activity.  Breathing is limiting her activity. No real changes since last time, no pred or abx. No cough or wheeze. She is using mucinex 1-2x a day. Rare albuterol neb use. Uses albuterol HFA few times a week.   Confirmed  on oximetry today that she is desaturated at rest.  Required 4 L/min at rest to saturate adequately.  Uptitrate to 6 L/min with exertion   Review of Systems As per HPI     Objective:   Physical Exam  Vitals:   05/30/22 1009  BP: (!) 144/78  Pulse: 79  Temp: 98.1 F (36.7 C)  TempSrc: Oral  SpO2: 99%  Weight: 190 lb (86.2 kg)  Height: 5\' 5"  (1.651 m)   Gen: Pleasant, well-nourished, in no distress in wheelchair,  normal affect, O2 in place  ENT: No lesions,  mouth clear,  oropharynx clear, no postnasal drip  Neck: No JVD, no stridor  Lungs: No use of accessory muscles, very distant, end expiratory wheezes bilaterally  Cardiovascular: RRR, heart sounds normal, no murmur or gallops, 2+ LE peripheral edema  Musculoskeletal: No deformities, no cyanosis or clubbing  Neuro: alert, non focal  Skin: Warm, no lesions or rashes      Assessment & Plan:  COPD (chronic obstructive pulmonary disease) with emphysema (HCC) Please continue Breztri 2 puffs twice a day.  Rinse and gargle after using. Keep your albuterol available to use either 2 puffs or 1 nebulizer treatment when you needed for shortness of breath, chest tightness, wheezing. We will hold off on restarting prednisone for now. Agree that you would benefit from continued palliative care support.  When they call 9/8 please have them explain which services you received, which you may qualify for.  If we need to change your orders to ensure that you get adequate support then please call our office to let us know and we will communicate with palliative care. Follow with Dr Delton Coombes in 6 months or sooner if you have any problems  Chronic respiratory failure with hypoxia (HCC) Continue your oxygen 4-6 L/min.  We confirmed that you need this today.  Levy Pupa, MD, PhD 05/30/2022, 10:27 AM [facility] [phone] or if no answer [phone]

## 2022-05-30 NOTE — Assessment & Plan Note (Signed)
Please continue Breztri 2 puffs twice a day.  Rinse and gargle after using. Keep your albuterol available to use either 2 puffs or 1 nebulizer treatment when you needed for shortness of breath, chest tightness, wheezing. We will hold off on restarting prednisone for now. Agree that you would benefit from continued palliative care support.  When they call 9/8 please have them explain which services you received, which you may qualify for.  If we need to change your orders to ensure that you get adequate support then please call our office to let us know and we will communicate with palliative care. Follow with Dr Delton Coombes in 6 months or sooner if you have any problems

## 2022-05-31 ENCOUNTER — Telehealth: Payer: Self-pay | Admitting: Emergency Medicine

## 2022-05-31 DIAGNOSIS — J438 Other emphysema: Secondary | ICD-10-CM

## 2022-05-31 DIAGNOSIS — J9611 Chronic respiratory failure with hypoxia: Secondary | ICD-10-CM

## 2022-06-03 NOTE — Telephone Encounter (Signed)
FYI:  Patient called to let us know that palliative care did not show up at her house last week as ordered. They did not call either.  I advised patient to reach out to palliative care to determine what the issues were.

## 2022-06-05 NOTE — Telephone Encounter (Signed)
I believe we should make another referral to palliative care, clarify that they need to evaluate her and determine what services she needs, or appropriate.

## 2022-06-05 NOTE — Telephone Encounter (Signed)
New order has been placed. Called and spoke with pt letting her know that RB wanted [place] to place another referral to palliative care and let her know that this had been done. Pt verbalized understanding. Nothing further needed.

## 2022-06-30 IMAGING — DX DG CHEST 2V
2 series · 2 of 2 positions shown · non-contrast
Comparison: 02/21/2011

CLINICAL DATA: COPD

EXAM:
CHEST - 2 VIEW

[chest pa]
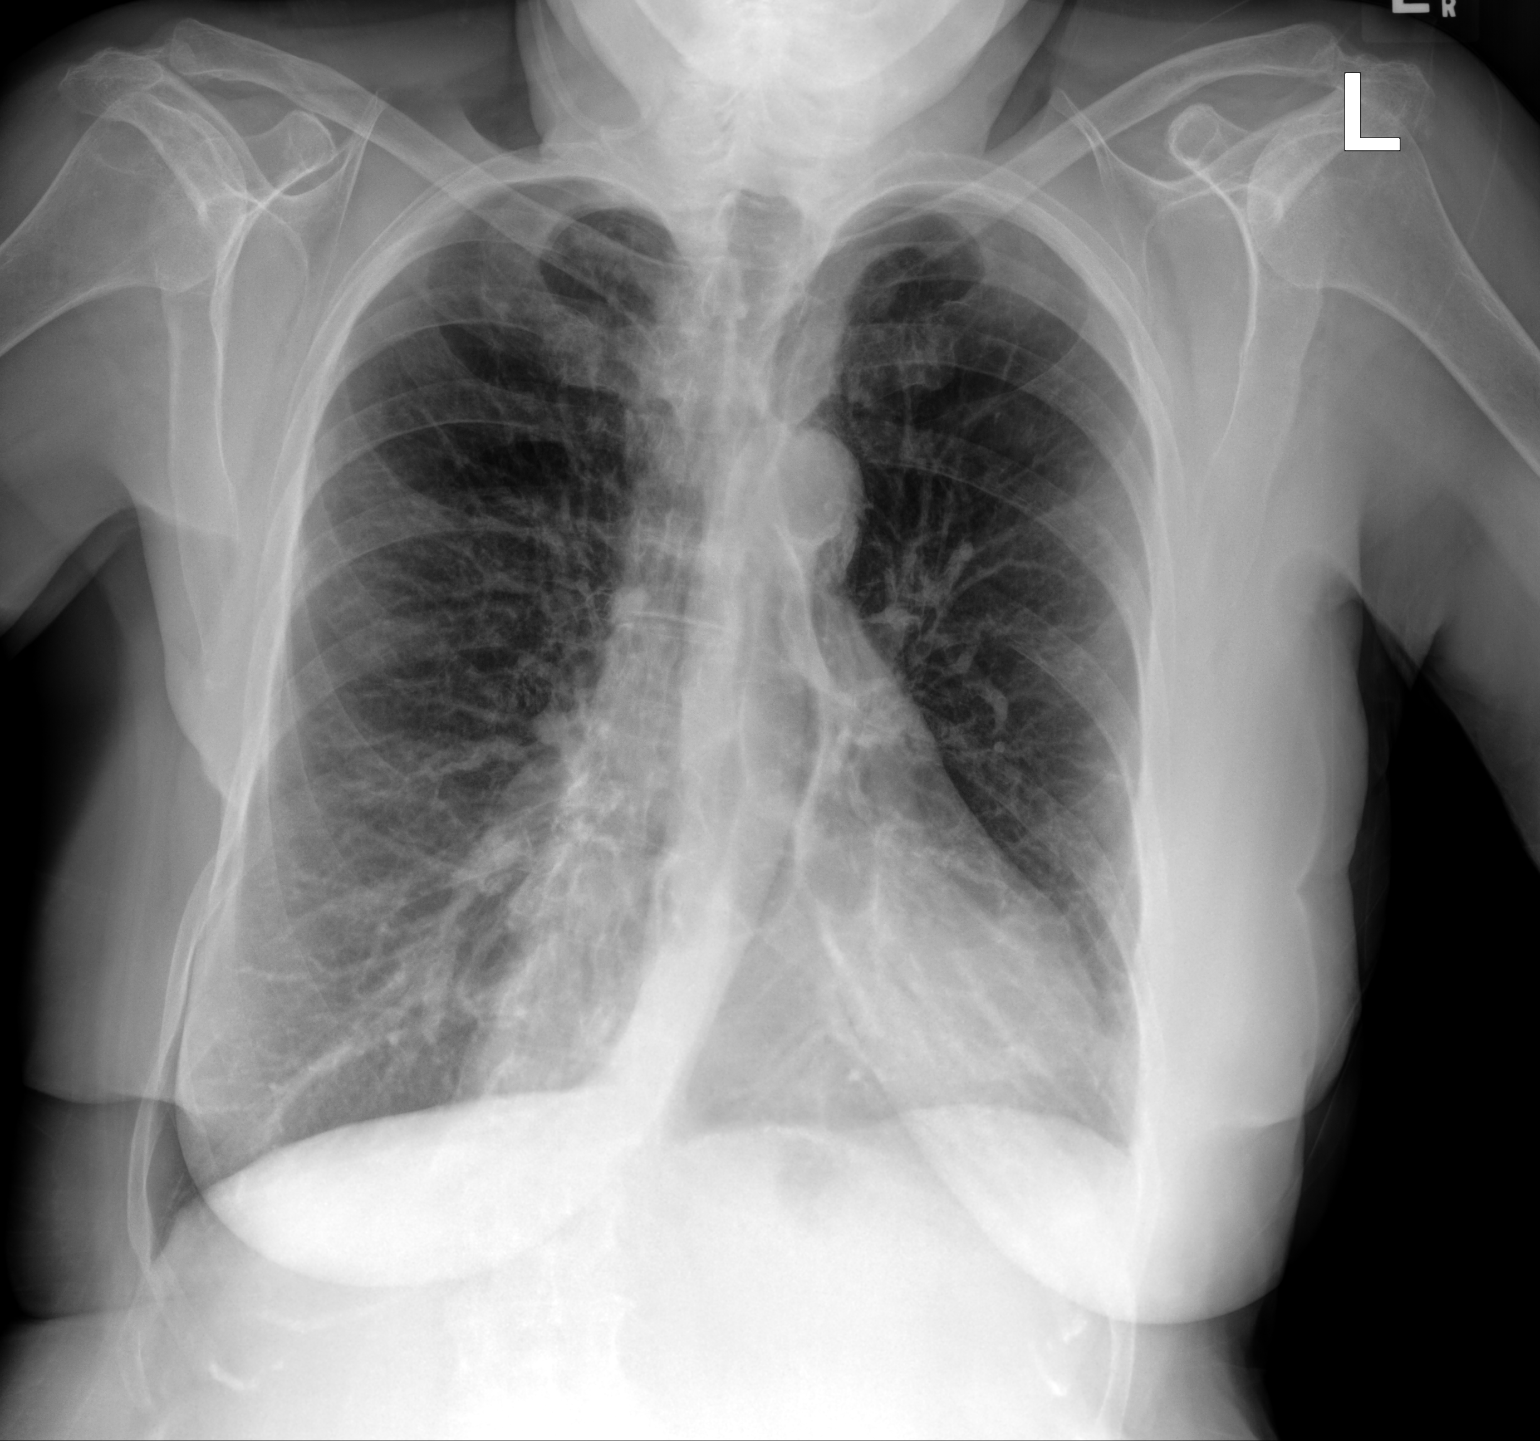

[chest lat]
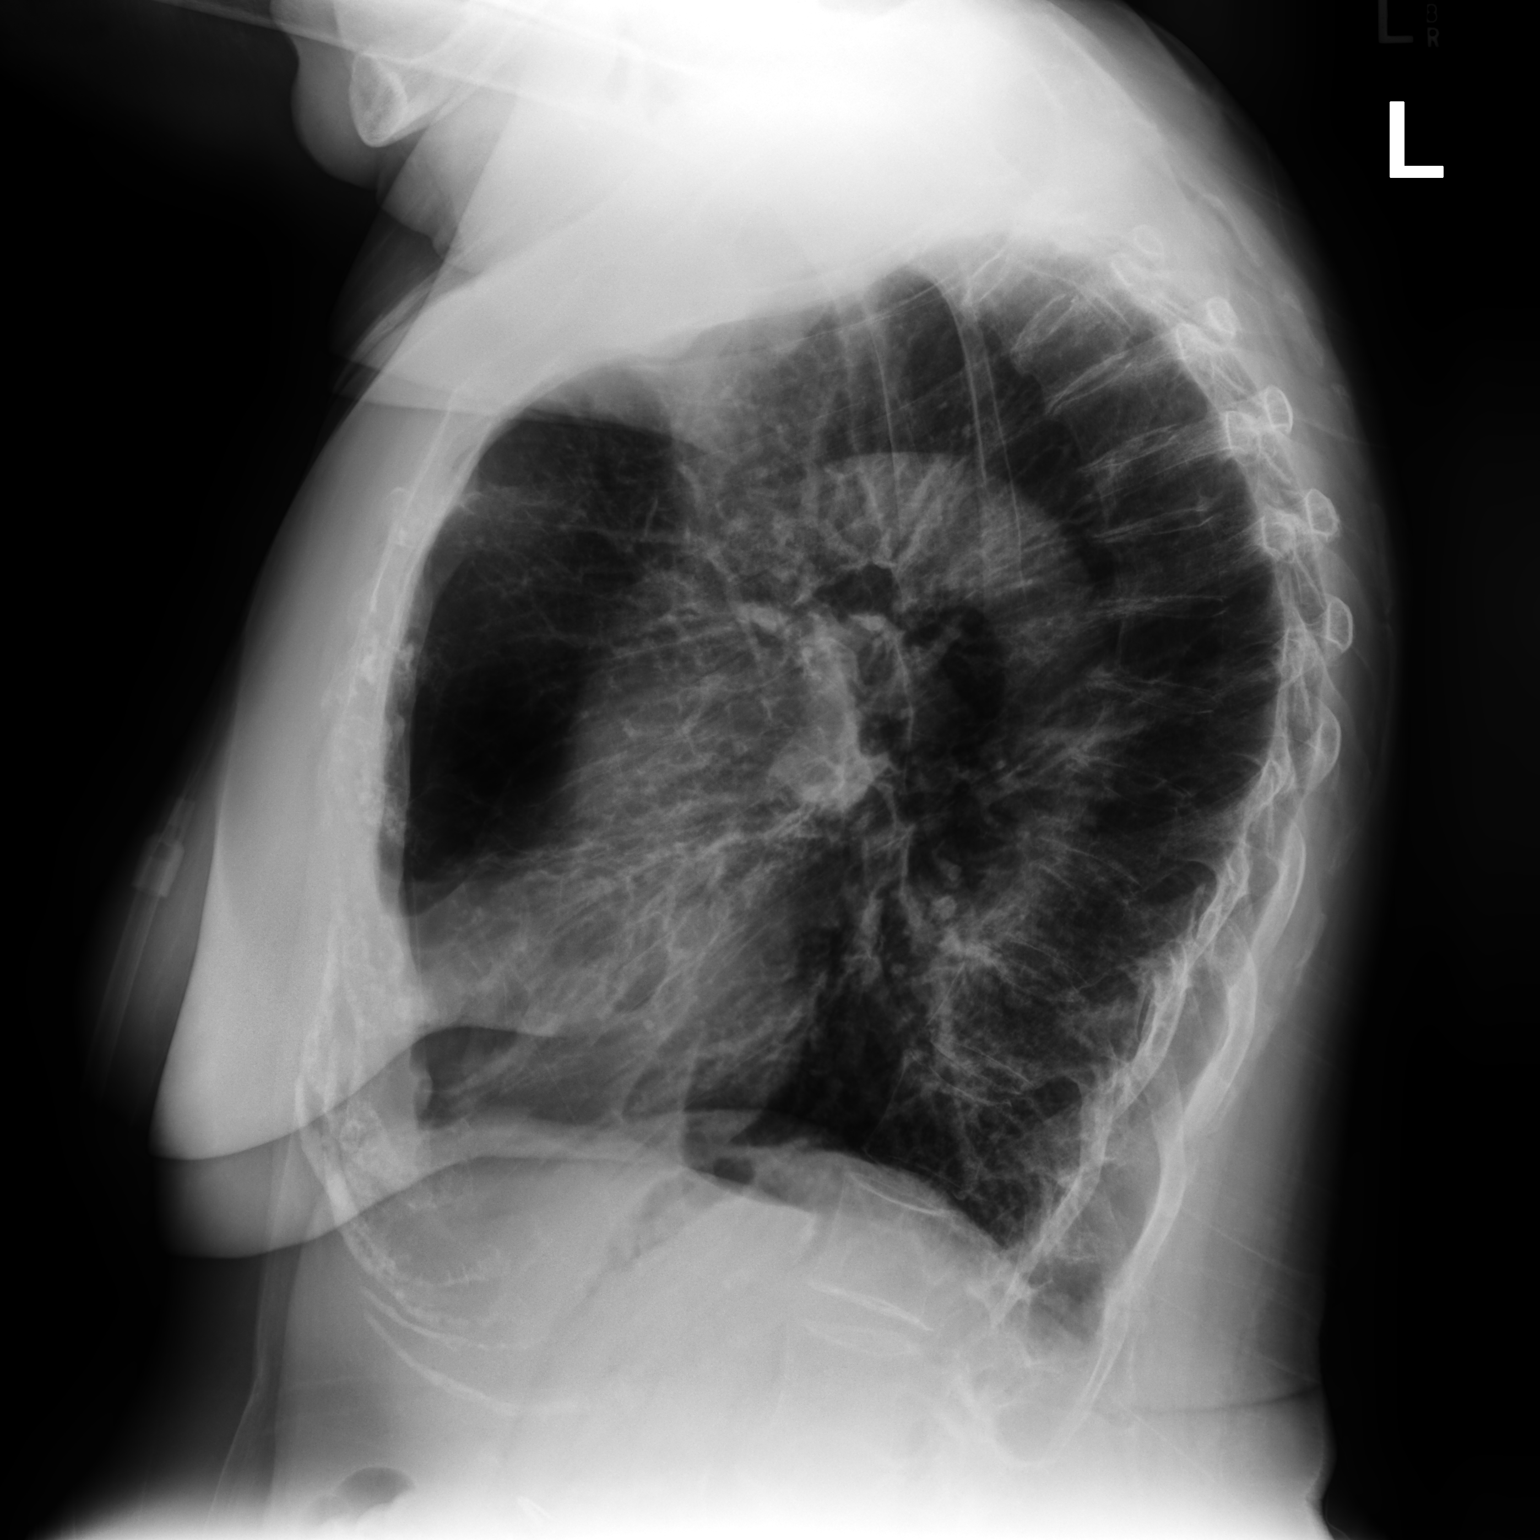

[2 of 2 positions shown; findings below may reference images not displayed]

FINDINGS: Lungs are hyperexpanded. Interstitial markings are diffusely
coarsened with chronic features. The lungs are clear without focal
pneumonia, edema, pneumothorax or pleural effusion.
Cardiopericardial silhouette is at upper limits of normal for size.
Bones are diffusely demineralized.
IMPRESSION: No active cardiopulmonary disease.

## 2022-07-03 ENCOUNTER — Ambulatory Visit (INDEPENDENT_AMBULATORY_CARE_PROVIDER_SITE_OTHER): Payer: Medicare Other

## 2022-07-03 DIAGNOSIS — Z0000 Encounter for general adult medical examination without abnormal findings: Secondary | ICD-10-CM | POA: Diagnosis not present

## 2022-07-03 NOTE — Progress Notes (Signed)
MEDICARE ANNUAL WELLNESS VISIT  07/03/2022  Telephone Visit Disclaimer This Medicare AWV was conducted by telephone due to national recommendations for restrictions regarding the COVID-19 Pandemic (e.g. social distancing).  I verified, using two identifiers, that I am speaking with Danielle Harmon or their authorized healthcare agent. I discussed the limitations, risks, security, and privacy concerns of performing an evaluation and management service by telephone and the potential availability of an in-person appointment in the future. The patient expressed understanding and agreed to proceed.  Location of Patient: Home Location of Provider (nurse):  [facility]  Subjective:    Danielle Harmon is a 81 y.o. female patient of Bennie Pierini, FNP who had a Medicare Annual Wellness Visit today via telephone. Danielle Harmon is Retired and lives alone. She has three children. She reports that she is socially active and does interact with friends/family regularly. She is minimally physically active and enjoys doing crossword puzzles and spending time with her family.  Patient Care Team: Bennie Pierini, FNP as PCP - General (Family Medicine) Leslye Peer, MD as Consulting Physician (Pulmonary Disease)     07/03/2022    9:04 AM 07/02/2021    9:11 AM 05/23/2020   10:51 AM 05/23/2020   10:41 AM 05/19/2019    2:08 PM  Advanced Directives  Does Patient Have a Medical Advance Directive? No Yes Yes No No;Yes  Type of Special educational needs teacher of [place];Out of facility DNR (pink MOST or yellow form);Living will Out of facility DNR (pink MOST or yellow form)  Healthcare Power of [place];Living will  Does patient want to make changes to medical advance directive?   No - Patient declined  No - Patient declined  Copy of Healthcare Power of Attorney in Chart?  No - copy requested   No - copy requested  Would patient like information on creating a medical advance directive? No - Patient  declined   No - Guardian declined Yes (MAU/Ambulatory/Procedural Areas - Information given);No - Patient declined    Hospital Utilization Over the Past 12 Months: # of hospitalizations or ER visits: 1 # of surgeries: 0  Review of Systems    Patient reports that her overall health is worse compared to last year. She feels that her COPD has gotten worse.  History obtained from chart review and the patient  Patient Reported Readings (BP, Pulse, CBG, Weight, etc) none  Pain Assessment Pain : No/denies pain     Current Medications & Allergies (verified) Allergies as of 07/03/2022       Reactions   Bee Venom Anaphylaxis        Medication List        Accurate as of July 03, 2022  9:14 AM. If you have any questions, ask your nurse or doctor.          albuterol (2.5 MG/3ML) 0.083% nebulizer solution Commonly known as: PROVENTIL Take 3 mLs (2.5 mg total) by nebulization every 6 (six) hours as needed for wheezing or shortness of breath. Pt may use every 4 to 6 hours as needed   albuterol 108 (90 Base) MCG/ACT inhaler Commonly known as: ProAir HFA Inhale 2 puffs into the lungs every 6 (six) hours as needed for wheezing or shortness of breath.   amLODipine 5 MG tablet Commonly known as: NORVASC Take 1 tablet (5 mg total) by mouth daily.   Breztri Aerosphere 160-9-4.8 MCG/ACT Aero Generic drug: Budeson-Glycopyrrol-Formoterol TAKE 2 PUFFS BY MOUTH TWICE A DAY   diclofenac Sodium 1 % Gel  Commonly known as: VOLTAREN Apply topically.   furosemide 20 MG tablet Commonly known as: LASIX Take 2 tablets (40 mg total) by mouth daily.   guaiFENesin 600 MG 12 hr tablet Commonly known as: MUCINEX Take 600 mg by mouth as needed.   omeprazole 20 MG capsule Commonly known as: PRILOSEC TAKE 1 CAPSULE BY MOUTH EVERY DAY   potassium chloride 10 MEQ tablet Commonly known as: KLOR-CON Take 1 tablet (10 mEq total) by mouth 2 (two) times daily.   Spacer/Aero Chamber  [employer] Use as directed   triamcinolone 55 MCG/ACT Aero nasal inhaler Commonly known as: NASACORT Place 2 sprays into the nose daily.        History (reviewed): Past Medical History:  Diagnosis Date   COPD (chronic obstructive pulmonary disease) (HCC)    GERD (gastroesophageal reflux disease)    Hypertension    Past Surgical History:  Procedure Laterality Date   CHOLECYSTECTOMY     CYSTECTOMY     benign cysts removed from head and R hand   KNEE ARTHROSCOPY     Left   Family History  Problem Relation Age of Onset   Emphysema Father    COPD Father    Stroke Mother    Diabetes Son    Social History   Socioeconomic History   Marital status: Widowed    Spouse name: Marvell Fuller Macht   Number of children: 3   Years of education: 16   Highest education level: Bachelor's degree (e.g., BA, AB, BS)  Occupational History   Occupation: Engineer, site    Comment: retired  Tobacco Use   Smoking status: Former    Packs/day: 1.00    Years: 40.00    Total pack years: 40.00    Types: Cigarettes    Quit date: 09/23/2002    Years since quitting: 19.7   Smokeless tobacco: Never  Vaping Use   Vaping Use: Never used  Substance and Sexual Activity   Alcohol use: Not Currently    Comment: occ   Drug use: Never   Sexual activity: Not on file  Other Topics Concern   Not on file  Social History Narrative   Lives alone - has a basement, but she doesn't go down there   Social Determinants of Health   Financial Resource Strain: Low Risk  (07/02/2021)   Overall Financial Resource Strain (CARDIA)    Difficulty of Paying Living Expenses: Not hard at all  Food Insecurity: No Food Insecurity (07/02/2021)   Hunger Vital Sign    Worried About Running Out of Food in the Last Year: Never true    Ran Out of Food in the Last Year: Never true  Transportation Needs: No Transportation Needs (07/02/2021)   PRAPARE - Administrator, Civil Service (Medical): No    Lack  of Transportation (Non-Medical): No  Physical Activity: Inactive (07/02/2021)   Exercise Vital Sign    Days of Exercise per Week: 0 days    Minutes of Exercise per Session: 0 min  Stress: No Stress Concern Present (07/02/2021)   [employer] of Occupational Health - Occupational Stress Questionnaire    Feeling of Stress : Not at all  Social Connections: Socially Isolated (07/02/2021)   Social Connection and Isolation Panel [NHANES]    Frequency of Communication with Friends and Family: More than three times a week    Frequency of Social Gatherings with Friends and Family: Twice a week    Attends Religious Services: Never  Active Member of Clubs or Organizations: No    Attends Banker Meetings: Never    Marital Status: Widowed    Activities of Daily Living    07/03/2022    9:05 AM  In your present state of health, do you have any difficulty performing the following activities:  Hearing? 0  Vision? 0  Difficulty concentrating or making decisions? 1  Walking or climbing stairs? 1  Dressing or bathing? 0  Doing errands, shopping? 1  Preparing Food and eating ? N  Using the Toilet? N  In the past six months, have you accidently leaked urine? Y  Do you have problems with loss of bowel control? N  Managing your Medications? N  Managing your Finances? N  Housekeeping or managing your Housekeeping? N  Patient reports shortness of breath when she is walking and/or climbing stairs and she feels that her memory has worsened in the last year.  She is unable to do errands alone because she is on oxygen and needs help with her tank.  One of her children go with her to all appointments and on any errands.    Patient Education/ Literacy How often do you need to have someone help you when you read instructions, pamphlets, or other written materials from your doctor or pharmacy?: 1 - Never What is the last grade level you completed in school?: bachelors  degree  Exercise Current Exercise Habits: The patient does not participate in regular exercise at present, Exercise limited by: respiratory conditions(s);orthopedic condition(s)  Diet Patient reports consuming 2 meals a day and 2 snack(s) a day Patient reports that her primary diet is: Regular Patient reports that she does have regular access to food.   Depression Screen    07/03/2022    9:13 AM 01/25/2022   10:27 AM 07/23/2021   12:33 PM 07/02/2021    9:10 AM 01/12/2021   10:16 AM 07/14/2020   10:23 AM 05/23/2020   10:36 AM  PHQ 2/9 Scores  PHQ - 2 Score 0 0 0 0 0 0 0  PHQ- 9 Score  2 5         Fall Risk    07/03/2022    9:12 AM 01/25/2022   10:27 AM 07/23/2021   12:30 PM 07/02/2021    9:11 AM 01/12/2021   10:16 AM  Fall Risk   Falls in the past year? 1 0 0 0 0  Number falls in past yr: 0   0   Injury with Fall? 0   0   Risk for fall due to : History of fall(s);Impaired balance/gait;Impaired mobility   Impaired balance/gait   Follow up Falls evaluation completed   Falls prevention discussed      Objective:  Danielle Harmon seemed alert and oriented and she participated appropriately during our telephone visit.  Blood Pressure Weight BMI  BP Readings from Last 3 Encounters:  05/30/22 (!) 144/78  01/25/22 (!) 146/82  12/21/21 136/72   Wt Readings from Last 3 Encounters:  05/30/22 190 lb (86.2 kg)  01/25/22 185 lb (83.9 kg)  12/21/21 185 lb 8 oz (84.1 kg)   BMI Readings from Last 1 Encounters:  05/30/22 31.62 kg/m    *Unable to obtain current vital signs, weight, and BMI due to telephone visit type  Hearing/Vision  Danielle Harmon did not seem to have difficulty with hearing/understanding during the telephone conversation Reports that she has not had a formal eye exam by an eye care professional within the past year Reports  that she has not had a formal hearing evaluation within the past year *Unable to fully assess hearing and vision during telephone visit type  Cognitive  Function:    07/03/2022    9:08 AM 05/23/2020   10:50 AM 05/19/2019    1:34 PM  6CIT Screen  What Year? 0 points 0 points 0 points  What month? 0 points 0 points 0 points  What time? 0 points 0 points 0 points  Count back from 20 0 points 0 points 0 points  Months in reverse 0 points 0 points 0 points  Repeat phrase 6 points 0 points 0 points  Total Score 6 points 0 points 0 points   (Normal:0-7, Significant for Dysfunction: >8)  Normal Cognitive Function Screening: Yes   Immunization & Health Maintenance Record Immunization History  Administered Date(s) Administered   Fluad Quad(high Dose 65+) 06/26/2016, 06/02/2017, 07/07/2018, 07/23/2021   Influenza Inj Mdck Quad With Preservative 06/29/2019   Influenza Whole 07/05/2014   Influenza, High Dose Seasonal PF 07/03/2015, 06/26/2016, 06/02/2017, 07/07/2018, 06/15/2020   Influenza, Seasonal, Injecte, Preservative Fre 06/26/2015   Influenza,inj,Quad PF,6+ Mos 06/23/2013, 06/26/2015   Influenza-Unspecified 07/05/2014, 07/20/2014, 06/26/2015   Moderna Sars-Covid-2 Vaccination 10/28/2019, 11/26/2019, 08/12/2020   Pneumococcal Conjugate-13 03/30/2015, 10/10/2015   Pneumococcal Polysaccharide-23 09/24/2011, 06/23/2012   Tdap 09/07/2018    Health Maintenance  Topic Date Due   Zoster Vaccines- Shingrix (1 of 2) Never done   COVID-19 Vaccine (4 - Moderna series) 10/07/2020   INFLUENZA VACCINE  12/22/2022 (Originally 04/23/2022)   DEXA SCAN  01/26/2023 (Originally 01/06/2006)   TETANUS/TDAP  09/07/2028   Pneumonia Vaccine 6765+ Years old  Completed   HPV VACCINES  Aged Out       Assessment  This is a routine wellness examination for Danielle Harmon.  Health Maintenance: Due or Overdue Health Maintenance Due  Topic Date Due   Zoster Vaccines- Shingrix (1 of 2) Never done   COVID-19 Vaccine (4 - Moderna series) 10/07/2020    Danielle DanaBrenda Harmon does not need a referral for Community Assistance: Care Management:   no Social  Work:    no Prescription Assistance:  no Nutrition/Diabetes Education:  no   Plan:  Personalized Goals  Goals Addressed             This Visit's Progress    Patient Stated       07/03/2022 AWV Goal: Fall Prevention  Over the next year, patient will decrease their risk for falls by: Using assistive devices, such as a cane or walker, as needed Identifying fall risks within their home and correcting them by: Removing throw rugs Adding handrails to stairs or ramps Removing clutter and keeping a clear pathway throughout the home Increasing light, especially at night Adding shower handles/bars Raising toilet seat Identifying potential personal risk factors for falls: Medication side effects Incontinence/urgency Vestibular dysfunction Hearing loss Musculoskeletal disorders Neurological disorders Orthostatic hypotension         Personalized Health Maintenance & Screening Recommendations  Influenza vaccine Shingrix vaccine  Lung Cancer Screening Recommended: yes (Low Dose CT Chest recommended if Age 69-80 years, 30 pack-year currently smoking OR have quit w/in past 15 years) Hepatitis C Screening recommended: no HIV Screening recommended: no  Advanced Directives: Written information was not prepared per patient's request.  Referrals & Orders No orders of the defined types were placed in this encounter.   Follow-up Plan Follow-up with Bennie PieriniMartin, Mary-Margaret, FNP as planned    I have personally reviewed and noted the following in the  patient's chart:   Medical and social history Use of alcohol, tobacco or illicit drugs  Current medications and supplements Functional ability and status Nutritional status Physical activity Advanced directives List of other physicians Hospitalizations, surgeries, and ER visits in previous 12 months Vitals Screenings to include cognitive, depression, and falls Referrals and appointments  In addition, I have reviewed and  discussed with Danielle Harmon certain preventive protocols, quality metrics, and best practice recommendations. A written personalized care plan for preventive services as well as general preventive health recommendations is available and can be mailed to the patient at her request.      Felicity Coyer LPN    [id number]  Patient declined after visit summary

## 2022-07-29 ENCOUNTER — Ambulatory Visit (INDEPENDENT_AMBULATORY_CARE_PROVIDER_SITE_OTHER): Payer: Medicare Other | Admitting: Nurse Practitioner

## 2022-07-29 ENCOUNTER — Encounter: Payer: Self-pay | Admitting: Nurse Practitioner

## 2022-07-29 VITALS — BP 150/77 | HR 78 | Temp 97.8°F | Resp 20

## 2022-07-29 DIAGNOSIS — K219 Gastro-esophageal reflux disease without esophagitis: Secondary | ICD-10-CM | POA: Diagnosis not present

## 2022-07-29 DIAGNOSIS — Z6834 Body mass index (BMI) 34.0-34.9, adult: Secondary | ICD-10-CM

## 2022-07-29 DIAGNOSIS — J9611 Chronic respiratory failure with hypoxia: Secondary | ICD-10-CM | POA: Diagnosis not present

## 2022-07-29 DIAGNOSIS — J438 Other emphysema: Secondary | ICD-10-CM

## 2022-07-29 DIAGNOSIS — I10 Essential (primary) hypertension: Secondary | ICD-10-CM | POA: Diagnosis not present

## 2022-07-29 DIAGNOSIS — Z23 Encounter for immunization: Secondary | ICD-10-CM | POA: Diagnosis not present

## 2022-07-29 DIAGNOSIS — R609 Edema, unspecified: Secondary | ICD-10-CM

## 2022-07-29 MED ORDER — FUROSEMIDE 20 MG PO TABS: 40.0000 mg | ORAL_TABLET | Freq: Every day | ORAL | 1 refills | Status: DC

## 2022-07-29 MED ORDER — AMLODIPINE BESYLATE 5 MG PO TABS: 5.0000 mg | ORAL_TABLET | Freq: Every day | ORAL | 1 refills | Status: DC

## 2022-07-29 MED ORDER — POTASSIUM CHLORIDE ER 10 MEQ PO TBCR: 10.0000 meq | EXTENDED_RELEASE_TABLET | Freq: Two times a day (BID) | ORAL | 1 refills | Status: DC

## 2022-07-29 MED ORDER — OMEPRAZOLE 20 MG PO CPDR: DELAYED_RELEASE_CAPSULE | ORAL | 1 refills | Status: DC

## 2022-07-29 NOTE — Progress Notes (Signed)
Subjective:    Patient ID: Danielle Harmon, female    DOB: [birth year], 81 y.o.   MRN: [id number]   Chief Complaint: Medical Management of Chronic Issues    HPI:  Danielle Harmon is a 81 y.o. who identifies as a female who was assigned female at birth.   Social history: Lives with: by herself- family check son her daily Work history: retired   Scientist, forensic in today for follow up of the following chronic medical issues:  1. Primary hypertension No c/o chest ,  or headaches. Does not check blood pressure at  home. BP Readings from Last 3 Encounters:  07/29/22 (!) 150/77  05/30/22 (!) 144/78  01/25/22 (!) 146/82     2. Chronic respiratory failure with hypoxia (HCC) Is on oxygen 24/7.   3. Other emphysema ([place]) Again is on oxygen 24/  4. Gastro-esophageal reflux disease without esophagitis Is on omeprazole daily and is doing well.  5. Peripheral edema Daily. Is on lasix daily  6. BMI 34.0-34.9,adult No recent weight changes Wt Readings from Last 3 Encounters:  05/30/22 190 lb (86.2 kg)  01/25/22 185 lb (83.9 kg)  12/21/21 185 lb 8 oz (84.1 kg)   BMI Readings from Last 3 Encounters:  05/30/22 31.62 kg/m  01/25/22 31.76 kg/m  12/21/21 31.84 kg/m      New complaints: None today  Allergies  Allergen Reactions   Bee Venom Anaphylaxis   Outpatient Encounter Medications as of 07/29/2022  Medication Sig   albuterol (PROAIR HFA) 108 (90 Base) MCG/ACT inhaler Inhale 2 puffs into the lungs every 6 (six) hours as needed for wheezing or shortness of breath.   albuterol (PROVENTIL) (2.5 MG/3ML) 0.083% nebulizer solution Take 3 mLs (2.5 mg total) by nebulization every 6 (six) hours as needed for wheezing or shortness of breath. Pt may use every 4 to 6 hours as needed   amLODipine (NORVASC) 5 MG tablet Take 1 tablet (5 mg total) by mouth daily.   Budeson-Glycopyrrol-Formoterol (BREZTRI AEROSPHERE) 160-9-4.8 MCG/ACT AERO TAKE 2 PUFFS BY MOUTH TWICE A DAY   diclofenac Sodium  (VOLTAREN) 1 % GEL Apply topically.   furosemide (LASIX) 20 MG tablet Take 2 tablets (40 mg total) by mouth daily.   guaiFENesin (MUCINEX) 600 MG 12 hr tablet Take 600 mg by mouth as needed.   omeprazole (PRILOSEC) 20 MG capsule TAKE 1 CAPSULE BY MOUTH EVERY DAY   potassium chloride (KLOR-CON) 10 MEQ tablet Take 1 tablet (10 mEq total) by mouth 2 (two) times daily.   Spacer/Aero Chamber [employer] Use as directed   triamcinolone (NASACORT) 55 MCG/ACT AERO nasal inhaler Place 2 sprays into the nose daily.   No facility-administered encounter medications on file as of 07/29/2022.    Past Surgical History:  Procedure Laterality Date   CHOLECYSTECTOMY     CYSTECTOMY     benign cysts removed from head and R hand   KNEE ARTHROSCOPY     Left    Family History  Problem Relation Age of Onset   Emphysema Father    COPD Father    Stroke Mother    Diabetes Son       Controlled substance contract: n/a     Review of Systems  Constitutional:  Negative for diaphoresis.  Eyes:  Negative for pain.  Respiratory:  Negative for shortness of breath.   Cardiovascular:  Positive for leg swelling. Negative for chest pain and palpitations.  Gastrointestinal:  Negative for abdominal pain.  Endocrine: Negative for polydipsia.  Skin:  Negative for rash.  Neurological:  Negative for dizziness, weakness and headaches.  Hematological:  Does not bruise/bleed easily.  All other systems reviewed and are negative.      Objective:   Physical Exam Vitals and nursing note reviewed.  Constitutional:      General: She is not in acute distress.    Appearance: Normal appearance. She is well-developed.  HENT:     Head: Normocephalic.     Right Ear: Tympanic membrane normal.     Left Ear: Tympanic membrane normal.     Nose: Nose normal.     Mouth/Throat:     Mouth: Mucous membranes are moist.  Eyes:     Pupils: Pupils are equal, round, and reactive to light.  Neck:     Vascular: No carotid  bruit or JVD.  Cardiovascular:     Rate and Rhythm: Normal rate and regular rhythm.     Heart sounds: Normal heart sounds.  Pulmonary:     Effort: Pulmonary effort is normal. No respiratory distress.     Breath sounds: Normal breath sounds. No wheezing or rales.  Chest:     Chest wall: No tenderness.  Abdominal:     General: Bowel sounds are normal. There is no distension or abdominal bruit.     Palpations: Abdomen is soft. There is no hepatomegaly, splenomegaly, mass or pulsatile mass.     Tenderness: There is no abdominal tenderness.  Musculoskeletal:        General: Normal range of motion.     Cervical back: Normal range of motion and neck supple.     Right lower leg: Edema (2+) present.     Left lower leg: Edema (2+) present.     Comments: Sitting in wheel chair  Lymphadenopathy:     Cervical: No cervical adenopathy.  Skin:    General: Skin is warm and dry.  Neurological:     Mental Status: She is alert and oriented to person, place, and time.     Deep Tendon Reflexes: Reflexes are normal and symmetric.  Psychiatric:        Behavior: Behavior normal.        Thought Content: Thought content normal.        Judgment: Judgment normal.     BP (!) 150/77   Pulse 78   Temp 97.8 F (36.6 C) (Temporal)   Resp 20   SpO2 100% Comment: 4-6 liters of O2       Assessment & Plan:   Danielle Harmon comes in today with chief complaint of Medical Management of Chronic Issues   Diagnosis and orders addressed:  1. Primary hypertension Low sodium diet - amLODipine (NORVASC) 5 MG tablet; Take 1 tablet (5 mg total) by mouth daily.  Dispense: 90 tablet; Refill: 1 - potassium chloride (KLOR-CON) 10 MEQ tablet; Take 1 tablet (10 mEq total) by mouth 2 (two) times daily.  Dispense: 180 tablet; Refill: 1  2. Chronic respiratory failure with hypoxia (HCC) Continue oxygen  3. Other emphysema (HCC)  4. Gastro-esophageal reflux disease without esophagitis Avoid spicy foods Do not eat 2  hours prior to bedtime - omeprazole (PRILOSEC) 20 MG capsule; TAKE 1 CAPSULE BY MOUTH EVERY DAY  Dispense: 90 capsule; Refill: 1  5. Peripheral edema Elevate legs when siting Take 2 lasix in morning - furosemide (LASIX) 20 MG tablet; Take 2 tablets (40 mg total) by mouth daily.  Dispense: 180 tablet; Refill: 1  6. BMI 34.0-34.9,adult Discussed diet and exercise for person with  BMI >25 Will recheck weight in 3-6 months    Labs pending Health Maintenance reviewed Diet and exercise encouraged  Follow up plan: 6 months   Mary-Margaret Hassell Done, FNP

## 2022-07-30 LAB — CBC WITH DIFFERENTIAL/PLATELET
Basophils Absolute: 0.1 10*3/uL (ref 0.0–0.2)
Basos: 1 %
EOS (ABSOLUTE): 0 10*3/uL (ref 0.0–0.4)
Eos: 1 %
Hematocrit: 39.1 % (ref 34.0–46.6)
Hemoglobin: 12.6 g/dL (ref 11.1–15.9)
Immature Grans (Abs): 0 10*3/uL (ref 0.0–0.1)
Immature Granulocytes: 0 %
Lymphocytes Absolute: 0.7 10*3/uL (ref 0.7–3.1)
Lymphs: 13 %
MCH: 29.2 pg (ref 26.6–33.0)
MCHC: 32.2 g/dL (ref 31.5–35.7)
MCV: 91 fL (ref 79–97)
Monocytes Absolute: 0.4 10*3/uL (ref 0.1–0.9)
Monocytes: 7 %
Neutrophils Absolute: 4.3 10*3/uL (ref 1.4–7.0)
Neutrophils: 78 %
Platelets: 220 10*3/uL (ref 150–450)
RBC: 4.31 x10E6/uL (ref 3.77–5.28)
RDW: 12.3 % (ref 11.7–15.4)
WBC: 5.6 10*3/uL (ref 3.4–10.8)

## 2022-07-30 LAB — CMP14+EGFR
ALT: 11 IU/L (ref 0–32)
AST: 21 IU/L (ref 0–40)
Albumin/Globulin Ratio: 1.6 (ref 1.2–2.2)
Albumin: 4 g/dL (ref 3.7–4.7)
Alkaline Phosphatase: 88 IU/L (ref 44–121)
BUN/Creatinine Ratio: 20 (ref 12–28)
BUN: 13 mg/dL (ref 8–27)
Bilirubin Total: 0.4 mg/dL (ref 0.0–1.2)
CO2: 33 mmol/L — ABNORMAL HIGH (ref 20–29)
Calcium: 9.5 mg/dL (ref 8.7–10.3)
Chloride: 92 mmol/L — ABNORMAL LOW (ref 96–106)
Creatinine, Ser: 0.66 mg/dL (ref 0.57–1.00)
Globulin, Total: 2.5 g/dL (ref 1.5–4.5)
Glucose: 104 mg/dL — ABNORMAL HIGH (ref 70–99)
Potassium: 4.1 mmol/L (ref 3.5–5.2)
Sodium: 136 mmol/L (ref 134–144)
Total Protein: 6.5 g/dL (ref 6.0–8.5)
eGFR: 88 mL/min/{1.73_m2} (ref >59)

## 2022-07-30 LAB — LIPID PANEL
Chol/HDL Ratio: 2 ratio (ref 0.0–4.4)
Cholesterol, Total: 188 mg/dL (ref 100–199)
HDL: 94 mg/dL (ref >39)
LDL Chol Calc (NIH): 84 mg/dL (ref 0–99)
Triglycerides: 51 mg/dL (ref 0–149)
VLDL Cholesterol Cal: 10 mg/dL (ref 5–40)

## 2022-10-18 ENCOUNTER — Telehealth: Payer: Self-pay | Admitting: Emergency Medicine

## 2022-10-18 NOTE — Telephone Encounter (Signed)
Patient's son, Lilygrace Rodick, called.  He needs to have a FMLA for completed for his employer so he can get paid when he needs to take care of his mother.  He will send a .pdf copy of the form to my email.

## 2022-10-21 ENCOUNTER — Telehealth: Payer: Self-pay | Admitting: Emergency Medicine

## 2022-10-21 NOTE — Telephone Encounter (Signed)
PT said [employer] will not renew script for O2 because she has to come in to see Dr. and have a walking test done.   PT states Dr. Rockey Situ her he would "take care of that" for her at one point because "There is not much he can do for me at this point."  Pls call PT to advise @ [phone]

## 2022-10-22 NOTE — Telephone Encounter (Signed)
PT calling for a 4th time. Pls call. Explained we are busy and Dr. Annamaria Boots must approve. TY

## 2022-10-23 ENCOUNTER — Telehealth: Payer: Self-pay | Admitting: *Deleted

## 2022-10-23 ENCOUNTER — Other Ambulatory Visit: Payer: Self-pay | Admitting: *Deleted

## 2022-10-23 DIAGNOSIS — J9611 Chronic respiratory failure with hypoxia: Secondary | ICD-10-CM

## 2022-10-23 DIAGNOSIS — J438 Other emphysema: Secondary | ICD-10-CM

## 2022-10-23 NOTE — Telephone Encounter (Signed)
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 Lamonte Sakai since his some requires him to do a lot for her.  He lives an hour from him and sometimes he has to take an entire day to do the things she needs.  He takes her to the doctor, assists her with her oxygen needs, does her shopping, ect.  He says that he needs the FMLA to state that he needs 4-5 days per month with up to 8 hours per episode.  His mom was last seen by Dr. Lamonte Sakai on 05/30/2022.  Can  you please reach out to him to see what he needs completed?  Thank you.

## 2022-10-23 NOTE — Telephone Encounter (Signed)
Called and spoke with pt. Let her know that an order was placed for her O2 and she verbalized understanding. Nothing further needed.

## 2022-10-23 NOTE — Telephone Encounter (Signed)
  PT said [employer] will not renew script for O2 because she has to come in to see Dr. and have a walking test done.    PT states Dr. Rockey Situ her he would "take care of that" for her at one point because "There is not much he can do for me at this point."   Pls call PT to advise @ [phone]    Called and spoke with patient's son regarding mother's oxygen.  I reviewed her last visit with Dr. Lamonte Sakai, which was on 05/30/22 and in his note it states to continue her oxygen 4-6 liters and that this liter flow was confirmed in the office today.  He states that she is in the wheelchair most of the time, but she does get up to do some simple things that cause her to exert herself.  She has a 10L home concentrator.  I advised him that I would get the order sent over to [employer] to have her oxygen renewed and to call us back with any further needs.  I also let him know I would make Darilyn aware of the need to renew his FMLA.  He verbalized understanding.  Orders sent to [employer] electronically.

## 2022-10-25 ENCOUNTER — Telehealth: Payer: Self-pay | Admitting: Emergency Medicine

## 2022-10-25 NOTE — Telephone Encounter (Signed)
Called and spoke with patient and she wanted to make sure she was not ordered a POC. She states she understands that she is on too much oxygen to qualify for POC. She states she called [employer] back to cancel that order. And she is just making sure that it did not mess up her current order with oxygen.   Called [employer] to make sure that her oxygen was taken care of. And Early [facility] is canceling the POC part of that order. But her current oxygen order is ok. Nothing further needed

## 2022-10-28 NOTE — Telephone Encounter (Signed)
Spoke to patient's son again today and FMLA form needs to be faxed no later than Friday, Feb 9.  I've prepared the form and will get Dr. Lamonte Sakai to sign it when he is in the office on Feb 9.  Danielle Harmon has requested that after I fax the form to his employer I email him a .pdf copy.

## 2022-10-28 NOTE — Telephone Encounter (Signed)
Nothing further needed . 

## 2022-10-30 DIAGNOSIS — Z0289 Encounter for other administrative examinations: Secondary | ICD-10-CM

## 2022-10-30 NOTE — Telephone Encounter (Signed)
Exchanged an email with patient's son Aaron Edelman - provided him a copy of the form I will have Dr. Lamonte Sakai sign on Fri, Feb 9.  Aaron Edelman return email stated the information on the form is correct and reminded my that it needs to be completed and faxed by Feb 9 in order for him to get paid for his FMLA.

## 2022-11-01 NOTE — Telephone Encounter (Signed)
FMLA for was signed by Dr. Lamonte Sakai and I faxed it to [phone].  Also emailed a hard copy to patient's son, Kimbely Pohle.

## 2022-11-29 ENCOUNTER — Ambulatory Visit: Payer: Medicare Other | Admitting: Emergency Medicine

## 2023-01-07 ENCOUNTER — Telehealth: Payer: Self-pay | Admitting: Emergency Medicine

## 2023-01-07 NOTE — Telephone Encounter (Signed)
On 01/07/23 I received the following email request from the patient's son, Paz Fuentes:    Good Morning Mrs Carolan Clines., Elvina Sidle, this is Shadell Brenn again. You helped me a couple months ago with my FMLA paperwork for my mother(Lorean Harvill). That's working great now., thank u.., I would now like to ask you if you can help me with this matter. I just need a letter from [facility] doctor (Dr Delton Coombes) that states she is indeed homebound and cannot go to the [facility] to get a ID to vote. I read here ([place] DMV website) that my mom could obtain a voting ID with the help of [place] homebound assistance. Once I have the letter, I will request them to come to the house and get her a ID so she will be allowed to vote. If you could just have Doctor Byrum write a letter and email it to this email. I will give it to my mom to give the agency. Thank you again.  I have reached out to Dr. Kavin Leech nurse, Dorisann Frames, to get this letter written and also replied back to Mr. Nazareno that I will let him know when the letter is completed.

## 2023-01-08 ENCOUNTER — Encounter: Payer: Self-pay | Admitting: *Deleted

## 2023-01-09 NOTE — Telephone Encounter (Signed)
Emailed signed letter from Dr. Delton Coombes to patient's son, Shateka Petrea.  Letter states patient has COPD and is on oxygen 24/7.  It asks that she be considered for the [employer].

## 2023-01-13 ENCOUNTER — Telehealth: Payer: Self-pay | Admitting: Emergency Medicine

## 2023-01-13 NOTE — Telephone Encounter (Signed)
Patient's son, Danielle Harmon, called me and he has a new FMLA from his employer for taking care of his mother. Last one was completed in Jan 2024.  He will email the form to me.  He stated the cover letter mentions the time he is taking on Saturdays.  He will fill out his part and explain that Saturdays are the best day to take care of her, and take her grandchildren to see her.   I noticed patient had canceled her March 4 appt and it was not rescheduled. Son is available to bring her on Thurs & Fridays, but not next week. He said Dr. Delton Coombes mentioned doing the next visit via Tele-visit. I don't think an appointment is needed to fill out the FMLA form for RB to sign this week.  I sent a message to Dr. Kavin Leech nurse to work on scheduling the appointment.

## 2023-01-15 NOTE — Telephone Encounter (Signed)
Completed updated FMLA form and sent to Dr. Delton Coombes for signature while he is in office on 4/25.

## 2023-01-17 NOTE — Telephone Encounter (Signed)
Updated FMLA for for patient's son, Zerah Hilyer, was signed by Dr. Delton Coombes and I faxed to his employer at fax# [phone].  Emailed hard copy to Mr. Bledsoe.

## 2023-01-26 ENCOUNTER — Other Ambulatory Visit: Payer: Self-pay | Admitting: Nurse Practitioner

## 2023-01-26 DIAGNOSIS — K219 Gastro-esophageal reflux disease without esophagitis: Secondary | ICD-10-CM

## 2023-01-27 ENCOUNTER — Encounter: Payer: Self-pay | Admitting: Nurse Practitioner

## 2023-01-27 ENCOUNTER — Ambulatory Visit (INDEPENDENT_AMBULATORY_CARE_PROVIDER_SITE_OTHER): Payer: Medicare Other | Admitting: Nurse Practitioner

## 2023-01-27 VITALS — BP 134/73 | HR 73 | Temp 97.5°F | Resp 20 | Ht 65.0 in | Wt 164.0 lb

## 2023-01-27 DIAGNOSIS — J9611 Chronic respiratory failure with hypoxia: Secondary | ICD-10-CM

## 2023-01-27 DIAGNOSIS — K219 Gastro-esophageal reflux disease without esophagitis: Secondary | ICD-10-CM

## 2023-01-27 DIAGNOSIS — Z6834 Body mass index (BMI) 34.0-34.9, adult: Secondary | ICD-10-CM

## 2023-01-27 DIAGNOSIS — I10 Essential (primary) hypertension: Secondary | ICD-10-CM | POA: Diagnosis not present

## 2023-01-27 DIAGNOSIS — R600 Localized edema: Secondary | ICD-10-CM

## 2023-01-27 DIAGNOSIS — J438 Other emphysema: Secondary | ICD-10-CM

## 2023-01-27 MED ORDER — AMLODIPINE BESYLATE 5 MG PO TABS: 5.0000 mg | ORAL_TABLET | Freq: Every day | ORAL | 1 refills | Status: DC

## 2023-01-27 MED ORDER — OMEPRAZOLE 20 MG PO CPDR: 20.0000 mg | DELAYED_RELEASE_CAPSULE | Freq: Every day | ORAL | 1 refills | Status: DC

## 2023-01-27 MED ORDER — BREZTRI AEROSPHERE 160-9-4.8 MCG/ACT IN AERO: INHALATION_SPRAY | RESPIRATORY_TRACT | 5 refills | Status: DC

## 2023-01-27 MED ORDER — POTASSIUM CHLORIDE ER 10 MEQ PO TBCR: 10.0000 meq | EXTENDED_RELEASE_TABLET | Freq: Two times a day (BID) | ORAL | 1 refills | Status: DC

## 2023-01-27 MED ORDER — FUROSEMIDE 20 MG PO TABS: 40.0000 mg | ORAL_TABLET | Freq: Every day | ORAL | 1 refills | Status: DC

## 2023-01-27 NOTE — Progress Notes (Signed)
Subjective:    Patient ID: Danielle Harmon, female    DOB: 1941/02/07, 82 y.o.   MRN: [id number]   Chief Complaint: medical management of chronic issues     HPI:  Danielle Harmon is a 82 y.o. who identifies as a female who was assigned female at birth.   Social history: Lives with: lives by herself and family calls and checks on her daily Work history: retired   Water engineer in today for follow up of the following chronic medical issues:  1. Primary hypertension No c/o chest pian, sob or headache. Does not check blood pressure at home. BP Readings from Last 3 Encounters:  07/29/22 (!) 150/77  05/30/22 (!) 144/78  01/25/22 (!) 146/82     2. Other emphysema (HCC) 3. Chronic respiratory failure with hypoxia (HCC) Is on breztri daily- has been doing well. Denies nay cough or wheezing. Wear oxygen via canula at 5L  4. Gastro-esophageal reflux disease without esophagitis Is on omeprazole daily and is doing well.  5. Peripheral edema Has some swelling daily  6. BMI 34.0-34.9,adult Weight is down 26lbs. Wt Readings from Last 3 Encounters:  01/27/23 164 lb (74.4 kg)  05/30/22 190 lb (86.2 kg)  01/25/22 185 lb (83.9 kg)   BMI Readings from Last 3 Encounters:  01/27/23 27.29 kg/m  05/30/22 31.62 kg/m  01/25/22 31.76 kg/m     New complaints: None today  Allergies  Allergen Reactions   Bee Venom Anaphylaxis   Outpatient Encounter Medications as of 01/27/2023  Medication Sig   albuterol (PROAIR HFA) 108 (90 Base) MCG/ACT inhaler Inhale 2 puffs into the lungs every 6 (six) hours as needed for wheezing or shortness of breath.   albuterol (PROVENTIL) (2.5 MG/3ML) 0.083% nebulizer solution Take 3 mLs (2.5 mg total) by nebulization every 6 (six) hours as needed for wheezing or shortness of breath. Pt may use every 4 to 6 hours as needed   amLODipine (NORVASC) 5 MG tablet Take 1 tablet (5 mg total) by mouth daily.   Budeson-Glycopyrrol-Formoterol (BREZTRI AEROSPHERE) 160-9-4.8  MCG/ACT AERO TAKE 2 PUFFS BY MOUTH TWICE A DAY   diclofenac Sodium (VOLTAREN) 1 % GEL Apply topically.   furosemide (LASIX) 20 MG tablet Take 2 tablets (40 mg total) by mouth daily.   guaiFENesin (MUCINEX) 600 MG 12 hr tablet Take 600 mg by mouth as needed.   omeprazole (PRILOSEC) 20 MG capsule TAKE 1 CAPSULE BY MOUTH EVERY DAY   potassium chloride (KLOR-CON) 10 MEQ tablet Take 1 tablet (10 mEq total) by mouth 2 (two) times daily.   Spacer/Aero Chamber [employer] Use as directed   triamcinolone (NASACORT) 55 MCG/ACT AERO nasal inhaler Place 2 sprays into the nose daily.   No facility-administered encounter medications on file as of 01/27/2023.    Past Surgical History:  Procedure Laterality Date   CHOLECYSTECTOMY     CYSTECTOMY     benign cysts removed from head and R hand   KNEE ARTHROSCOPY     Left    Family History  Problem Relation Age of Onset   Emphysema Father    COPD Father    Stroke Mother    Diabetes Son       Controlled substance contract: n/a     Review of Systems  Constitutional:  Negative for diaphoresis.  Eyes:  Negative for pain.  Respiratory:  Negative for shortness of breath.   Cardiovascular:  Negative for chest pain, palpitations and leg swelling.  Gastrointestinal:  Negative for abdominal pain.  Endocrine:  Negative for polydipsia.  Skin:  Negative for rash.  Neurological:  Negative for dizziness, weakness and headaches.  Hematological:  Does not bruise/bleed easily.  All other systems reviewed and are negative.      Objective:   Physical Exam Vitals and nursing note reviewed.  Constitutional:      General: She is not in acute distress.    Appearance: Normal appearance. She is well-developed.  HENT:     Head: Normocephalic.     Right Ear: Tympanic membrane normal.     Left Ear: Tympanic membrane normal.     Nose: Nose normal.     Mouth/Throat:     Mouth: Mucous membranes are moist.  Eyes:     Pupils: Pupils are equal, round, and  reactive to light.  Neck:     Vascular: No carotid bruit or JVD.  Cardiovascular:     Rate and Rhythm: Normal rate and regular rhythm.     Heart sounds: Normal heart sounds.  Pulmonary:     Effort: Pulmonary effort is normal. No respiratory distress.     Breath sounds: Normal breath sounds. No wheezing or rales.     Comments: O2 at 5l via nasal canula  Chest:     Chest wall: No tenderness.  Abdominal:     General: Bowel sounds are normal. There is no distension or abdominal bruit.     Palpations: Abdomen is soft. There is no hepatomegaly, splenomegaly, mass or pulsatile mass.     Tenderness: There is no abdominal tenderness.  Musculoskeletal:        General: Normal range of motion.     Cervical back: Normal range of motion and neck supple.     Right lower leg: Edema (1+) present.     Left lower leg: Edema (1+) present.  Lymphadenopathy:     Cervical: No cervical adenopathy.  Skin:    General: Skin is warm and dry.  Neurological:     Mental Status: She is alert and oriented to person, place, and time.     Deep Tendon Reflexes: Reflexes are normal and symmetric.  Psychiatric:        Behavior: Behavior normal.        Thought Content: Thought content normal.        Judgment: Judgment normal.    BP 134/73   Pulse 73   Temp (!) 97.5 F (36.4 C) (Temporal)   Resp 20   Ht 5\' 5"  (1.651 m)   Wt 164 lb (74.4 kg)   SpO2 100% Comment: 6 liters O2  BMI 27.29 kg/m         Assessment & Plan:  Crystalin Metze comes in today with chief complaint of Medical Management of Chronic Issues   Diagnosis and orders addressed:  1. Primary hypertension Low sodium diet - amLODipine (NORVASC) 5 MG tablet; Take 1 tablet (5 mg total) by mouth daily.  Dispense: 90 tablet; Refill: 1 - potassium chloride (KLOR-CON) 10 MEQ tablet; Take 1 tablet (10 mEq total) by mouth 2 (two) times daily.  Dispense: 180 tablet; Refill: 1 - CBC with Differential/Platelet - CMP14+EGFR - Lipid panel  2. Other  emphysema (HCC) Continue oxygen - Budeson-Glycopyrrol-Formoterol (BREZTRI AEROSPHERE) 160-9-4.8 MCG/ACT AERO; TAKE 2 PUFFS BY MOUTH TWICE A DAY  Dispense: 10.7 g; Refill: 5  3. Chronic respiratory failure with hypoxia (HCC) Continue oxygen  4. Gastro-esophageal reflux disease without esophagitis Avoid spicy foods Do not eat 2 hours prior to bedtime  - omeprazole (PRILOSEC) 20 MG capsule;  Take 1 capsule (20 mg total) by mouth daily.  Dispense: 90 capsule; Refill: 1  5. Peripheral edema Continue elevate legs - furosemide (LASIX) 20 MG tablet; Take 2 tablets (40 mg total) by mouth daily.  Dispense: 180 tablet; Refill: 1  6. BMI 34.0-34.9,adult Discussed diet and exercise for person with BMI >25 Will recheck weight in 3-6 months    Labs pending Health Maintenance reviewed Diet and exercise encouraged  Follow up plan: 6 months   Mary-Margaret Daphine Deutscher, FNP

## 2023-01-28 LAB — CMP14+EGFR
ALT: 16 IU/L (ref 0–32)
AST: 21 IU/L (ref 0–40)
Albumin/Globulin Ratio: 1.4 (ref 1.2–2.2)
Albumin: 3.6 g/dL — ABNORMAL LOW (ref 3.7–4.7)
Alkaline Phosphatase: 108 IU/L (ref 44–121)
BUN/Creatinine Ratio: 19 (ref 12–28)
BUN: 15 mg/dL (ref 8–27)
Bilirubin Total: 0.3 mg/dL (ref 0.0–1.2)
CO2: 34 mmol/L — ABNORMAL HIGH (ref 20–29)
Calcium: 9.3 mg/dL (ref 8.7–10.3)
Chloride: 92 mmol/L — ABNORMAL LOW (ref 96–106)
Creatinine, Ser: 0.8 mg/dL (ref 0.57–1.00)
Globulin, Total: 2.6 g/dL (ref 1.5–4.5)
Glucose: 107 mg/dL — ABNORMAL HIGH (ref 70–99)
Potassium: 4.4 mmol/L (ref 3.5–5.2)
Sodium: 137 mmol/L (ref 134–144)
Total Protein: 6.2 g/dL (ref 6.0–8.5)
eGFR: 74 mL/min/{1.73_m2} (ref >59)

## 2023-01-28 LAB — CBC WITH DIFFERENTIAL/PLATELET
Basophils Absolute: 0.1 10*3/uL (ref 0.0–0.2)
Basos: 1 %
EOS (ABSOLUTE): 0 10*3/uL (ref 0.0–0.4)
Eos: 1 %
Hematocrit: 37.3 % (ref 34.0–46.6)
Hemoglobin: 12.1 g/dL (ref 11.1–15.9)
Immature Grans (Abs): 0 10*3/uL (ref 0.0–0.1)
Immature Granulocytes: 0 %
Lymphocytes Absolute: 0.9 10*3/uL (ref 0.7–3.1)
Lymphs: 14 %
MCH: 29.3 pg (ref 26.6–33.0)
MCHC: 32.4 g/dL (ref 31.5–35.7)
MCV: 90 fL (ref 79–97)
Monocytes Absolute: 0.5 10*3/uL (ref 0.1–0.9)
Monocytes: 8 %
Neutrophils Absolute: 5 10*3/uL (ref 1.4–7.0)
Neutrophils: 76 %
Platelets: 202 10*3/uL (ref 150–450)
RBC: 4.13 x10E6/uL (ref 3.77–5.28)
RDW: 12.2 % (ref 11.7–15.4)
WBC: 6.5 10*3/uL (ref 3.4–10.8)

## 2023-01-28 LAB — LIPID PANEL
Chol/HDL Ratio: 1.9 ratio (ref 0.0–4.4)
Cholesterol, Total: 205 mg/dL — ABNORMAL HIGH (ref 100–199)
HDL: 109 mg/dL (ref >39)
LDL Chol Calc (NIH): 87 mg/dL (ref 0–99)
Triglycerides: 47 mg/dL (ref 0–149)
VLDL Cholesterol Cal: 9 mg/dL (ref 5–40)

## 2023-03-26 ENCOUNTER — Telehealth: Payer: Self-pay | Admitting: Emergency Medicine

## 2023-03-26 NOTE — Telephone Encounter (Signed)
Patient would like to do a mychart video visit on 04/10/2023 w/ Dr. Delton Coombes. Would need directions. Patient phone number is [phone].

## 2023-03-31 NOTE — Telephone Encounter (Signed)
Okay to change to a video visit.

## 2023-04-03 ENCOUNTER — Telehealth: Payer: Self-pay | Admitting: Emergency Medicine

## 2023-04-03 NOTE — Telephone Encounter (Signed)
PT has a MYCHART visit on 7/18 and states she can not do a computer visit because she is technically impaired.  I offered to  send step by step directions for her ipad and to have her come in for [place] to show her how but she states she has no car and I can tell by speaking with her she would not be able to navigate [facility]. Please call to advise of any alternatives @ [phone].  Also, lives alone.   PT sounds very SOB and is 82 YO

## 2023-04-07 NOTE — Telephone Encounter (Signed)
Spoke with patient. States from someone from our office has been helping her navigate my chart and she states she things she is fine to get on there for her apt. I asked if someone could bring her and she said she didn't have anyone. She is fine with using my chart for now. Closing encounter. NFN

## 2023-04-07 NOTE — Telephone Encounter (Signed)
Hi Amanda- Sending to you, Dr. Izola Price nurse, for Wednesday in case Triage can not get to this. TY.

## 2023-04-09 ENCOUNTER — Telehealth: Payer: Self-pay | Admitting: Emergency Medicine

## 2023-04-09 NOTE — Telephone Encounter (Signed)
Pt called MyChart help desk to get set up for OV for tm, unfortunately pt is having trouble getting set up and will like her visit over the phone. Pt is 82 years old, has no transportation to her visit and no children live nearby pls advise

## 2023-04-09 NOTE — Telephone Encounter (Signed)
Ok to Change it to a phone visit

## 2023-04-10 ENCOUNTER — Telehealth (INDEPENDENT_AMBULATORY_CARE_PROVIDER_SITE_OTHER): Payer: Medicare Other | Admitting: Emergency Medicine

## 2023-04-10 ENCOUNTER — Encounter: Payer: Self-pay | Admitting: Emergency Medicine

## 2023-04-10 DIAGNOSIS — J438 Other emphysema: Secondary | ICD-10-CM | POA: Diagnosis not present

## 2023-04-10 DIAGNOSIS — J301 Allergic rhinitis due to pollen: Secondary | ICD-10-CM

## 2023-04-10 DIAGNOSIS — J9611 Chronic respiratory failure with hypoxia: Secondary | ICD-10-CM

## 2023-04-10 NOTE — Assessment & Plan Note (Signed)
Continue oxygen 5-6 L/min depending on level of exertion.  She is sleeping with 5 L/min.

## 2023-04-10 NOTE — Assessment & Plan Note (Signed)
Continue same regimen 

## 2023-04-10 NOTE — Progress Notes (Signed)
Virtual Visit via Telephone Note  I connected with Danielle Harmon on 04/10/23 at 11:00 AM EDT by telephone and verified that I am speaking with the correct person using two identifiers.  Location: Patient: Home Provider: Office   I discussed the limitations, risks, security and privacy concerns of performing an evaluation and management service by telephone and the availability of in person appointments. I also discussed with the patient that there may be a patient responsible charge related to this service. The patient expressed understanding and agreed to proceed.   History of Present Illness: 82 year old woman with very severe COPD and chronic hypoxemic respiratory failure.  She has been on palliative care in the past and is DNR/DNI.  She is not currently on maintenance prednisone, has been on this in the past.   Observations/Objective: She is using oxygen at 5-6 L/min Currently maintenance is Breztri She uses albuterol HFA and nebulizers rarely, not every day Mucinex as needed She is able to get up and move around slowly, she still lives alone. Does not climb stairs or do her her laundry. She can make meals, kids come over and help w housework.  Minimal cough, no sputum. No flares  She is not getting any help from palliative care, unclear whether they are still following her   Assessment and Plan: COPD (chronic obstructive pulmonary disease) with emphysema (HCC) Very severe COPD.  She was previously getting palliative care assistance although does not appear that that is still ongoing.  As she progresses, needs more help I suspect that she will require help at home.  She does indicate that she will stay at home if at all possible.  She does not sound like she is at a point where she is transitioning to hospice, gets help from her family.  Plan to continue manage her on Breztri, albuterol as needed, Mucinex as needed  Chronic respiratory failure with hypoxia (HCC) Continue oxygen 5-6  L/min depending on level of exertion.  She is sleeping with 5 L/min.  Allergic rhinitis Continue same regimen   Follow Up Instructions: Video visit in 6 months, or as needed   I discussed the assessment and treatment plan with the patient. The patient was provided an opportunity to ask questions and all were answered. The patient agreed with the plan and demonstrated an understanding of the instructions.   The patient was advised to call back or seek an in-person evaluation if the symptoms worsen or if the condition fails to improve as anticipated.  I provided 25 minutes of non-face-to-face time during this encounter.   Levy Pupa, MD, PhD 04/10/2023, 11:18 AM [facility] [phone] or if no answer before 7:00PM call [phone] For any issues after 7:00PM please call eLink [phone]

## 2023-04-10 NOTE — Assessment & Plan Note (Signed)
Very severe COPD.  She was previously getting palliative care assistance although does not appear that that is still ongoing.  As she progresses, needs more help I suspect that she will require help at home.  She does indicate that she will stay at home if at all possible.  She does not sound like she is at a point where she is transitioning to hospice, gets help from her family.  Plan to continue manage her on Breztri, albuterol as needed, Mucinex as needed

## 2023-04-16 NOTE — Telephone Encounter (Signed)
Closing encounter since patient had visit already with Dr. Delton Coombes

## 2023-05-26 ENCOUNTER — Other Ambulatory Visit: Payer: Self-pay | Admitting: Nurse Practitioner

## 2023-05-26 DIAGNOSIS — I10 Essential (primary) hypertension: Secondary | ICD-10-CM

## 2023-07-10 ENCOUNTER — Ambulatory Visit: Payer: Medicare Other

## 2023-07-10 VITALS — Ht 66.0 in | Wt 160.0 lb

## 2023-07-10 DIAGNOSIS — Z0100 Encounter for examination of eyes and vision without abnormal findings: Secondary | ICD-10-CM

## 2023-07-10 DIAGNOSIS — Z0000 Encounter for general adult medical examination without abnormal findings: Secondary | ICD-10-CM

## 2023-07-10 NOTE — Progress Notes (Signed)
Subjective:   Danielle Harmon is a 82 y.o. female who presents for Medicare Annual (Subsequent) preventive examination.  Visit Complete: Virtual I connected with  [name] on 07/10/23 by a audio enabled telemedicine application and verified that I am speaking with the correct person using two identifiers.  Patient Location: Home  Provider Location: Home Office  I discussed the limitations of evaluation and management by telemedicine. The patient expressed understanding and agreed to proceed.  Vital Signs: Because this visit was a virtual/telehealth visit, some criteria may be missing or patient reported. Any vitals not documented were not able to be obtained and vitals that have been documented are patient reported.  Patient Medicare AWV questionnaire was completed by the patient on 07/10/2023; I have confirmed that all information answered by patient is correct and no changes since this date.  Cardiac Risk Factors include: advanced age (>62men, >9 women);dyslipidemia;hypertension;sedentary lifestyle     Objective:    Today's Vitals   07/10/23 0803  Weight: 160 lb (72.6 kg)  Height: 5\' 6"  (1.676 m)   Body mass index is 25.82 kg/m.     07/10/2023    8:08 AM 07/03/2022    9:04 AM 07/02/2021    9:11 AM 05/23/2020   10:51 AM 05/23/2020   10:41 AM 05/19/2019    2:08 PM  Advanced Directives  Does Patient Have a Medical Advance Directive? Yes No Yes Yes No No;Yes  Type of Estate agent of [place];Living will  Healthcare Power of [place];Out of facility DNR (pink MOST or yellow form);Living will Out of facility DNR (pink MOST or yellow form)  Healthcare Power of [place];Living will  Does patient want to make changes to medical advance directive? No - Patient declined   No - Patient declined  No - Patient declined  Copy of Healthcare Power of Attorney in Chart? Yes - validated most recent copy scanned in chart (See row information)  No - copy requested   No -  copy requested  Would patient like information on creating a medical advance directive?  No - Patient declined   No - Guardian declined Yes (MAU/Ambulatory/Procedural Areas - Information given);No - Patient declined    Current Medications (verified) Outpatient Encounter Medications as of 07/10/2023  Medication Sig   albuterol (PROAIR HFA) 108 (90 Base) MCG/ACT inhaler Inhale 2 puffs into the lungs every 6 (six) hours as needed for wheezing or shortness of breath.   albuterol (PROVENTIL) (2.5 MG/3ML) 0.083% nebulizer solution Take 3 mLs (2.5 mg total) by nebulization every 6 (six) hours as needed for wheezing or shortness of breath. Pt may use every 4 to 6 hours as needed   amLODipine (NORVASC) 5 MG tablet TAKE 1 TABLET (5 MG TOTAL) BY MOUTH DAILY.   Budeson-Glycopyrrol-Formoterol (BREZTRI AEROSPHERE) 160-9-4.8 MCG/ACT AERO TAKE 2 PUFFS BY MOUTH TWICE A DAY   diclofenac Sodium (VOLTAREN) 1 % GEL Apply topically.   furosemide (LASIX) 20 MG tablet Take 2 tablets (40 mg total) by mouth daily.   guaiFENesin (MUCINEX) 600 MG 12 hr tablet Take 600 mg by mouth as needed.   omeprazole (PRILOSEC) 20 MG capsule Take 1 capsule (20 mg total) by mouth daily.   potassium chloride (KLOR-CON) 10 MEQ tablet Take 1 tablet (10 mEq total) by mouth 2 (two) times daily.   Spacer/Aero Chamber [employer] Use as directed   triamcinolone (NASACORT) 55 MCG/ACT AERO nasal inhaler Place 2 sprays into the nose daily.   No facility-administered encounter medications on file as of  07/10/2023.    Allergies (verified) Bee venom   History: Past Medical History:  Diagnosis Date   COPD (chronic obstructive pulmonary disease) (HCC)    GERD (gastroesophageal reflux disease)    Hypertension    Past Surgical History:  Procedure Laterality Date   CHOLECYSTECTOMY     CYSTECTOMY     benign cysts removed from head and R hand   KNEE ARTHROSCOPY     Left   Family History  Problem Relation Age of Onset   Emphysema  Father    COPD Father    Stroke Mother    Diabetes Son    Social History   Socioeconomic History   Marital status: Widowed    Spouse name: Marvell Fuller [place]   Number of children: 3   Years of education: 16   Highest education level: Bachelor's degree (e.g., BA, AB, BS)  Occupational History   Occupation: Engineer, site    Comment: retired  Tobacco Use   Smoking status: Former    Current packs/day: 0.00    Average packs/day: 1 pack/day for 40.0 years (40.0 ttl pk-yrs)    Types: Cigarettes    Start date: 09/23/1962    Quit date: 09/23/2002    Years since quitting: 20.8   Smokeless tobacco: Never  Vaping Use   Vaping status: Never Used  Substance and Sexual Activity   Alcohol use: Not Currently    Comment: occ   Drug use: Never   Sexual activity: Not on file  Other Topics Concern   Not on file  Social History Narrative   Lives alone - has a basement, but she doesn't go down there   Social Determinants of Health   Financial Resource Strain: Low Risk  (07/10/2023)   Overall Financial Resource Strain (CARDIA)    Difficulty of Paying Living Expenses: Not hard at all  Food Insecurity: No Food Insecurity (07/10/2023)   Hunger Vital Sign    Worried About Running Out of Food in the Last Year: Never true    Ran Out of Food in the Last Year: Never true  Transportation Needs: No Transportation Needs (07/10/2023)   PRAPARE - Administrator, Civil Service (Medical): No    Lack of Transportation (Non-Medical): No  Physical Activity: Inactive (07/10/2023)   Exercise Vital Sign    Days of Exercise per Week: 0 days    Minutes of Exercise per Session: 0 min  Stress: No Stress Concern Present (07/10/2023)   [employer] of Occupational Health - Occupational Stress Questionnaire    Feeling of Stress : Not at all  Social Connections: Socially Isolated (07/10/2023)   Social Connection and Isolation Panel [NHANES]    Frequency of Communication with Friends and Family:  More than three times a week    Frequency of Social Gatherings with Friends and Family: More than three times a week    Attends Religious Services: Never    Database administrator or Organizations: No    Attends Banker Meetings: Never    Marital Status: Widowed    Tobacco Counseling Counseling given: Not Answered   Clinical Intake:  Pre-visit preparation completed: Yes  Pain : No/denies pain     Nutritional Risks: None Diabetes: No  How often do you need to have someone help you when you read instructions, pamphlets, or other written materials from your doctor or pharmacy?: 1 - Never  Interpreter Needed?: No  Information entered by :: Renie Ora, LPN   Activities of Daily  Living    07/10/2023    8:08 AM  In your present state of health, do you have any difficulty performing the following activities:  Hearing? 0  Vision? 0  Difficulty concentrating or making decisions? 0  Walking or climbing stairs? 0  Dressing or bathing? 0  Doing errands, shopping? 0  Preparing Food and eating ? N  Using the Toilet? N  In the past six months, have you accidently leaked urine? N  Do you have problems with loss of bowel control? N  Managing your Medications? N  Managing your Finances? N  Housekeeping or managing your Housekeeping? N    Patient Care Team: Bennie Pierini, FNP as PCP - General (Family Medicine) Leslye Peer, MD as Consulting Physician (Pulmonary Disease)  Indicate any recent Medical Services you may have received from other than [facility] providers in the past year (date may be approximate).     Assessment:   This is a routine wellness examination for [place].  Hearing/Vision screen Vision Screening - Comments:: Referral 07/10/2023   Goals Addressed             This Visit's Progress    DIET - EAT MORE FRUITS AND VEGETABLES   On track      Depression Screen    07/10/2023    8:06 AM 01/27/2023   10:48 AM 07/29/2022   11:19 AM  07/03/2022    9:13 AM 01/25/2022   10:27 AM 07/23/2021   12:33 PM 07/02/2021    9:10 AM  PHQ 2/9 Scores  PHQ - 2 Score 0 2 0 0 0 0 0  PHQ- 9 Score  9 1  2 5      Fall Risk    07/10/2023    8:05 AM 01/27/2023   10:48 AM 07/29/2022   11:19 AM 07/03/2022    9:12 AM 01/25/2022   10:27 AM  Fall Risk   Falls in the past year? 0 0 0 1 0  Number falls in past yr: 0   0   Injury with Fall? 0   0   Risk for fall due to : No Fall Risks   History of fall(s);Impaired balance/gait;Impaired mobility   Follow up Falls prevention discussed   Falls evaluation completed     MEDICARE RISK AT HOME: Medicare Risk at Home Any stairs in or around the home?: Yes If so, are there any without handrails?: No Home free of loose throw rugs in walkways, pet beds, electrical cords, etc?: Yes Adequate lighting in your home to reduce risk of falls?: Yes Life alert?: No Use of a cane, walker or w/c?: No Grab bars in the bathroom?: Yes Shower chair or bench in shower?: Yes Elevated toilet seat or a handicapped toilet?: Yes  TIMED UP AND GO:  Was the test performed?  No    Cognitive Function:        07/10/2023    8:09 AM 07/03/2022    9:08 AM 05/23/2020   10:50 AM 05/19/2019    1:34 PM  6CIT Screen  What Year? 0 points 0 points 0 points 0 points  What month? 0 points 0 points 0 points 0 points  What time? 0 points 0 points 0 points 0 points  Count back from 20 0 points 0 points 0 points 0 points  Months in reverse 0 points 0 points 0 points 0 points  Repeat phrase 0 points 6 points 0 points 0 points  Total Score 0 points 6 points 0 points 0  points    Immunizations Immunization History  Administered Date(s) Administered   Fluad Quad(high Dose 65+) 06/26/2016, 06/02/2017, 07/07/2018, 07/23/2021, 07/29/2022   Influenza Inj Mdck Quad With Preservative 06/29/2019   Influenza Whole 07/05/2014   Influenza, High Dose Seasonal PF 07/03/2015, 06/26/2016, 06/02/2017, 07/07/2018, 06/15/2020   Influenza,  Seasonal, Injecte, Preservative Fre 06/26/2015   Influenza,inj,Quad PF,6+ Mos 06/23/2013, 06/26/2015   Influenza-Unspecified 07/05/2014, 07/20/2014, 06/26/2015   Moderna Sars-Covid-2 Vaccination 10/28/2019, 11/26/2019, 08/12/2020   Pneumococcal Conjugate-13 03/30/2015, 10/10/2015   Pneumococcal Polysaccharide-23 09/24/2011, 06/23/2012   Tdap 09/07/2018    TDAP status: Up to date  Flu Vaccine status: Due, Education has been provided regarding the importance of this vaccine. Advised may receive this vaccine at local pharmacy or Health Dept. Aware to provide a copy of the vaccination record if obtained from local pharmacy or Health Dept. Verbalized acceptance and understanding.  Pneumococcal vaccine status: Up to date  Covid-19 vaccine status: Completed vaccines  Qualifies for Shingles Vaccine? Yes   Zostavax completed No   Shingrix Completed?: No.    Education has been provided regarding the importance of this vaccine. Patient has been advised to call insurance company to determine out of pocket expense if they have not yet received this vaccine. Advised may also receive vaccine at local pharmacy or Health Dept. Verbalized acceptance and understanding.  Screening Tests Health Maintenance  Topic Date Due   Zoster Vaccines- Shingrix (1 of 2) Never done   DEXA SCAN  Never done   INFLUENZA VACCINE  04/24/2023   COVID-19 Vaccine (4 - 2023-24 season) 05/25/2023   Medicare Annual Wellness (AWV)  07/09/2024   DTaP/Tdap/Td (2 - Td or Tdap) 09/07/2028   Pneumonia Vaccine 32+ Years old  Completed   HPV VACCINES  Aged Out    Health Maintenance  Health Maintenance Due  Topic Date Due   Zoster Vaccines- Shingrix (1 of 2) Never done   DEXA SCAN  Never done   INFLUENZA VACCINE  04/24/2023   COVID-19 Vaccine (4 - 2023-24 season) 05/25/2023    Colorectal cancer screening: No longer required.   Mammogram status: No longer required due to age.  Bone Density status: Ordered declined . Pt  provided with contact info and advised to call to schedule appt.  Lung Cancer Screening: (Low Dose CT Chest recommended if Age 15-80 years, 20 pack-year currently smoking OR have quit w/in 15years.) does not qualify.   Lung Cancer Screening Referral: n/a  Additional Screening:  Hepatitis C Screening: does not qualify;  Vision Screening: Recommended annual ophthalmology exams for early detection of glaucoma and other disorders of the eye. Is the patient up to date with their annual eye exam?  No  Who is the provider or what is the name of the office in which the patient attends annual eye exams? None referral 07/10/2023 If pt is not established with a provider, would they like to be referred to a provider to establish care? No .   Dental Screening: Recommended annual dental exams for proper oral hygiene   Community Resource Referral / Chronic Care Management: CRR required this visit?  No   CCM required this visit?  No     Plan:     I have personally reviewed and noted the following in the patient's chart:   Medical and social history Use of alcohol, tobacco or illicit drugs  Current medications and supplements including opioid prescriptions. Patient is not currently taking opioid prescriptions. Functional ability and status Nutritional status Physical activity Advanced directives List  of other physicians Hospitalizations, surgeries, and ER visits in previous 12 months Vitals Screenings to include cognitive, depression, and falls Referrals and appointments  In addition, I have reviewed and discussed with patient certain preventive protocols, quality metrics, and best practice recommendations. A written personalized care plan for preventive services as well as general preventive health recommendations were provided to patient.     Lorrene Reid, LPN   [id number]/06/[id number]   After Visit Summary: (MyChart) Due to this being a telephonic visit, the after visit summary with patients  personalized plan was offered to patient via MyChart   Nurse Notes: none

## 2023-07-10 NOTE — Patient Instructions (Signed)
Danielle Harmon , Thank you for taking time to come for your Medicare Wellness Visit. I appreciate your ongoing commitment to your health goals. Please review the following plan we discussed and let me know if I can assist you in the future.   Referrals/Orders/Follow-Ups/Clinician Recommendations: Aim for 30 minutes of exercise or brisk walking, 6-8 glasses of water, and 5 servings of fruits and vegetables each day.   This is a list of the screening recommended for you and due dates:  Health Maintenance  Topic Date Due   Zoster (Shingles) Vaccine (1 of 2) Never done   DEXA scan (bone density measurement)  Never done   Flu Shot  04/24/2023   COVID-19 Vaccine (4 - 2023-24 season) 05/25/2023   Medicare Annual Wellness Visit  07/09/2024   DTaP/Tdap/Td vaccine (2 - Td or Tdap) 09/07/2028   Pneumonia Vaccine  Completed   HPV Vaccine  Aged Out    Advanced directives: (In Chart) A copy of your advanced directives are scanned into your chart should your provider ever need it.  Next Medicare Annual Wellness Visit scheduled for next year: Yes  Insert Preventive Care attachment Insert FALL PREVENTION attachment if needed

## 2023-07-19 ENCOUNTER — Other Ambulatory Visit: Payer: Self-pay | Admitting: Nurse Practitioner

## 2023-07-19 DIAGNOSIS — R600 Localized edema: Secondary | ICD-10-CM

## 2023-07-29 ENCOUNTER — Ambulatory Visit: Payer: Medicare Other | Admitting: Nurse Practitioner

## 2023-08-07 ENCOUNTER — Ambulatory Visit: Payer: Medicare Other | Admitting: Nurse Practitioner

## 2023-08-11 ENCOUNTER — Telehealth: Payer: Self-pay | Admitting: Emergency Medicine

## 2023-08-11 NOTE — Telephone Encounter (Signed)
PT calling about her O2. States she had issues with the knob and when she called the supplier they told her she had "given up" getting her O2. She wonders now if the Dr. stopped it. Please call to advise @ [phone]  Last AVS states:   Chronic respiratory failure with hypoxia (HCC) Continue oxygen 5-6 L/min depending on level of exertion.  She is sleeping with 5 L/min.

## 2023-08-12 NOTE — Telephone Encounter (Signed)
Patient needs a new oxygen machine. She never paid for it and now they say she will have to pay for the tanks to be refilled ([facility]). Patient has medicare and [employer]. Please call and advise [phone]

## 2023-08-13 NOTE — Telephone Encounter (Signed)
Olen Pel; Kathe Becton; Randie Heinz, [place] Looks like pt needs to be recertified for O2 message sent to that time to follow up with patient       Previous Messages

## 2023-08-13 NOTE — Telephone Encounter (Signed)
PT calling again in a panic and afraid her O2 machine will break down. I advised her we are working on it and that a message has been sent to [employer]. Please call when we figure out what is going on for her Thanks.

## 2023-08-13 NOTE — Telephone Encounter (Signed)
I called and spoke with the pt  She is aware needs appt to recert for o2 The next available appt 10/07/23- scheduled  Nothing further needed

## 2023-08-13 NOTE — Telephone Encounter (Signed)
Thank you :)

## 2023-08-13 NOTE — Telephone Encounter (Signed)
PT calling again. States she has had her O2 13 years and never requested it be stopped. Third call. Please see if we can assist. TY.

## 2023-08-18 ENCOUNTER — Encounter: Payer: Self-pay | Admitting: Nurse Practitioner

## 2023-08-18 ENCOUNTER — Ambulatory Visit: Payer: Medicare Other | Admitting: Nurse Practitioner

## 2023-08-18 VITALS — BP 148/79 | HR 76 | Temp 97.4°F | Resp 20 | Ht 66.0 in | Wt 157.0 lb

## 2023-08-18 DIAGNOSIS — J9611 Chronic respiratory failure with hypoxia: Secondary | ICD-10-CM

## 2023-08-18 DIAGNOSIS — Z23 Encounter for immunization: Secondary | ICD-10-CM

## 2023-08-18 NOTE — Addendum Note (Signed)
Addended by: Cleda Daub on: 08/18/2023 04:34 PM   Modules accepted: Orders

## 2023-08-18 NOTE — Patient Instructions (Signed)
Home Oxygen Use, Adult When a medical condition keeps you from getting enough oxygen, your health care provider may have you use extra oxygen at home. Your health care provider will let you know: When to use oxygen. How much oxygen to use. The amount is set in liters per minute (LPM or L/M). How long to use oxygen. Home oxygen can be given through: A mask. This covers the nose and mouth or covers a tracheotomy tube. A nasal cannula. This is a device or tube that goes in the nostrils. A transtracheal catheter. This is a small, thin tube placed through the neck and into the windpipe (trachea). A breathing tube (tracheostomy tube) that is surgically placed in the windpipe. These devices have tubing that connects to an oxygen source, such as: A tank. Tanks hold oxygen in gas form. The tank must be replaced when the oxygen is used up. A liquid oxygen device. This holds oxygen in liquid form. This must be replaced when the oxygen is used up. An oxygen concentrator machine. This filters oxygen in the room. It uses electricity so you must have a backup oxygen tank in case the power goes out. Work with your health care provider to find equipment that works best for you and your lifestyle. What are the risks? Your health care provider will talk with you about risks. These may include: Fire. This can happen if the oxygen is exposed to a heat source, flame, or spark. Injury to the skin. This can happen if liquid oxygen touches the skin. Pressure sores may occur if the oxygen tubing presses on the skin. Damage to the lungs or other organs. This can happen from getting too little or too much oxygen. Supplies needed: To use oxygen, you will need: A mask, nasal cannula, transtracheal catheter, or tracheostomy. An oxygen tank, a liquid oxygen device, or an oxygen concentrator. Your health care provider may recommend: A humidifier. This device adds moisture to the oxygen. A pulse oximeter. This device  measures the percentage of oxygen in your blood. How to use oxygen You will be shown how to use your oxygen device. Follow the instructions, which may look something like this: Wash your hands with soap and water for at least 20 seconds. Turn on the oxygen. Make sure the oxygen unit is working right. To do this: Place the end of the oxygen tubing in a cup of water before connecting it to the nasal cannula, mask, or transtracheal catheter. The water will bubble if oxygen is flowing. Place one end of the oxygen tubing into the port on the tank, device, or machine. Connect the other end to the nasal cannula, mask, or transtracheal catheter. Turn the liter-flow setting on the machine to the level you are told. Place the nasal cannula in your nose, or place the mask over your mouth and nose or tracheostomy tube. Turn off the oxygen when you are not using it. How to clean and care for the oxygen supplies Clean or replace your oxygen equipment and supplies as told by the medical device company that supplies the equipment. Safety tips Fire safety tips  Keep your oxygen and oxygen supplies at least 6 ft (2 m) away from sources of heat, flames, and sparks at all times. Do not allow smoking near your oxygen. Put up "no smoking" signs in your home. Avoid smoking areas in public. Do not use materials that can burn (are flammable) while you use oxygen. This includes: Petroleum jelly. Hand sanitizer. Rubbing alcohol. Hair spray  or other aerosol sprays. Keep a Government social research officer nearby. Tell your fire department that you have oxygen in your home. Test your home smoke detectors often. Traveling Secure your oxygen tank in the vehicle so that it does not move. Follow instructions from your medical device company about how to safely secure your tank. Have enough oxygen for the amount of time you will be away from home. If you plan to travel by public transportation such as airplane, train, bus, or boat,  contact the company to arrange a portable oxygen delivery system. You may need documents from your health care provider and medical device company before you travel. General safety tips Keep extra supplies on hand, including extra tubing and an extra cannula or mask. If you use an oxygen cylinder, keep it in a stand or secure it to an object that will not move. If you use liquid oxygen, always keep the container upright. If you use an oxygen concentrator: Tell Museum/gallery curator company. Make sure you are given priority service if your power goes out. Avoid using extension cords if possible. Keep an extra supply of backup oxygen tanks. Follow these instructions at home: Use oxygen only as told by your health care provider. Do not use alcohol or other drugs that make you relax (sedating drugs) unless told. They can slow down your breathing rate and make it hard to get in enough oxygen. Know how and when to order a refill of oxygen. Plan for holidays when you may not be able to get a prescription filled. Use water-based lubricants on your lips or nostrils. Do not use oil-based products like petroleum jelly. Ask your health care provider how to prevent skin irritation on your cheeks or behind your ears. Contact a health care provider if: You are more tired than normal and have little energy. You have dry or irritated skin in your nose or on your face. You have nosebleeds. You are restless, irritable, or anxious. You get headaches often. You are not sleeping well. Get help right away if: You have trouble breathing. You are confused. You are sleepy all the time. You have blue lips or fingernails. These symptoms may be an emergency. Get help right away. Call 911. Do not wait to see if the symptoms will go away. Do not drive yourself to the hospital. This information is not intended to replace advice given to you by your health care provider. Make sure you discuss any questions you have with your  health care provider. Document Revised: 04/09/2022 Document Reviewed: 04/09/2022 Elsevier Patient Education  2024 [employer].

## 2023-08-18 NOTE — Progress Notes (Signed)
   Subjective:    Patient ID: Danielle Harmon, female    DOB: 02/09/1941, 82 y.o.   MRN: [id number]   Chief Complaint: Oxygen certification.  HPI  Patient has been on oxygen for years . She's currently on 6L via nasal canula. She is her to have recertified because he needs new equipment and or service on current machine.  Patient Active Problem List   Diagnosis Date Noted   Peripheral edema 01/12/2021   BMI 34.0-34.9,adult 07/14/2020   Chronic respiratory failure with hypoxia (HCC) 04/06/2020   Hypertension 04/26/2019   Allergic rhinitis 06/03/2016   Gastro-esophageal reflux disease without esophagitis 06/01/2014   COPD (chronic obstructive pulmonary disease) with emphysema (HCC) 02/21/2011       Review of Systems  Constitutional:  Negative for diaphoresis.  Eyes:  Negative for pain.  Respiratory:  Negative for shortness of breath.   Cardiovascular:  Negative for chest pain, palpitations and leg swelling.  Gastrointestinal:  Negative for abdominal pain.  Endocrine: Negative for polydipsia.  Skin:  Negative for rash.  Neurological:  Negative for dizziness, weakness and headaches.  Hematological:  Does not bruise/bleed easily.  All other systems reviewed and are negative.      Objective:   Physical Exam Constitutional:      Appearance: Normal appearance.  Cardiovascular:     Rate and Rhythm: Normal rate and regular rhythm.     Heart sounds: Normal heart sounds.  Pulmonary:     Effort: Pulmonary effort is normal.     Breath sounds: Wheezing present.  Musculoskeletal:     Comments: In wheel chair  Neurological:     Mental Status: She is alert.    BP (!) 148/79   Pulse 76   Temp (!) 97.4 F (36.3 C) (Temporal)   Resp 20   Ht 5\' 6"  (1.676 m)   Wt 157 lb (71.2 kg)   SpO2 100% Comment: 6 liters  BMI 25.34 kg/m   Oxygen sat with no oxygen 87% Not able to exercise due to weakness- stays in wheel chair most of the time Oxygen sat at 6L via canula 96-99 %       Assessment & Plan:  Danielle Harmon in today with chief complaint of Recertification of O2   1. Chronic respiratory failure with hypoxia (HCC) Certify for continued oxygen Continue oxygen at 6L via canula RTO prn    The above assessment and management plan was discussed with the patient. The patient verbalized understanding of and has agreed to the management plan. Patient is aware to call the clinic if symptoms persist or worsen. Patient is aware when to return to the clinic for a follow-up visit. Patient educated on when it is appropriate to go to the emergency department.   Mary-Margaret Daphine Deutscher, FNP

## 2023-08-19 LAB — CBC WITH DIFFERENTIAL/PLATELET
Basophils Absolute: 0 10*3/uL (ref 0.0–0.2)
Basos: 1 %
EOS (ABSOLUTE): 0.1 10*3/uL (ref 0.0–0.4)
Eos: 1 %
Hematocrit: 38.1 % (ref 34.0–46.6)
Hemoglobin: 12.2 g/dL (ref 11.1–15.9)
Immature Grans (Abs): 0 10*3/uL (ref 0.0–0.1)
Immature Granulocytes: 0 %
Lymphocytes Absolute: 0.9 10*3/uL (ref 0.7–3.1)
Lymphs: 15 %
MCH: 29.7 pg (ref 26.6–33.0)
MCHC: 32 g/dL (ref 31.5–35.7)
MCV: 93 fL (ref 79–97)
Monocytes Absolute: 0.5 10*3/uL (ref 0.1–0.9)
Monocytes: 9 %
Neutrophils Absolute: 4.5 10*3/uL (ref 1.4–7.0)
Neutrophils: 74 %
Platelets: 183 10*3/uL (ref 150–450)
RBC: 4.11 x10E6/uL (ref 3.77–5.28)
RDW: 12.9 % (ref 11.7–15.4)
WBC: 6.1 10*3/uL (ref 3.4–10.8)

## 2023-08-19 LAB — CMP14+EGFR
ALT: 14 [IU]/L (ref 0–32)
AST: 19 [IU]/L (ref 0–40)
Albumin: 3.8 g/dL (ref 3.7–4.7)
Alkaline Phosphatase: 104 [IU]/L (ref 44–121)
BUN/Creatinine Ratio: 17 (ref 12–28)
BUN: 14 mg/dL (ref 8–27)
Bilirubin Total: 0.3 mg/dL (ref 0.0–1.2)
CO2: 33 mmol/L — ABNORMAL HIGH (ref 20–29)
Calcium: 9.2 mg/dL (ref 8.7–10.3)
Chloride: 94 mmol/L — ABNORMAL LOW (ref 96–106)
Creatinine, Ser: 0.82 mg/dL (ref 0.57–1.00)
Globulin, Total: 2.6 g/dL (ref 1.5–4.5)
Glucose: 89 mg/dL (ref 70–99)
Potassium: 4.6 mmol/L (ref 3.5–5.2)
Sodium: 141 mmol/L (ref 134–144)
Total Protein: 6.4 g/dL (ref 6.0–8.5)
eGFR: 71 mL/min/{1.73_m2} (ref >59)

## 2023-08-21 ENCOUNTER — Other Ambulatory Visit: Payer: Self-pay | Admitting: Emergency Medicine

## 2023-08-28 NOTE — Telephone Encounter (Signed)
Copied from CRM [id number]. Topic: Clinical - Home Health Verbal Orders >> Aug 27, 2023  3:26 PM Fuller Mandril wrote: Caller/Agency: Hilda Lias from [facility]  Callback Number:  [phone] Service Requested: Call back regarding pending order for oxygen - Additional information needed. Please call to clarify order. Needs to confirm tank or concentrator? Needs At Rest included in process notes.  Frequency: N/A Any new concerns about the patient? N/A

## 2023-08-29 ENCOUNTER — Ambulatory Visit: Payer: Medicare Other | Admitting: Nurse Practitioner

## 2023-09-09 ENCOUNTER — Ambulatory Visit: Payer: Self-pay | Admitting: Nurse Practitioner

## 2023-09-09 NOTE — Telephone Encounter (Signed)
Copied from CRM [id number]. Topic: Clinical - Medical Advice >> Sep 09, 2023  3:28 PM Conni Elliot wrote: Reason for CRM: Pts daughter Pricilla Holm) called in stating pt has been experiencing confusion, pts daughter would like to speak with a nurse and would like a callback, callback number is [phone]   Chief Complaint: Confusion Symptoms: Confusion, trouble sleeping Frequency: Constant for the last 4 days Pertinent Negatives: Patient denies flank pain, itching, or burning with urination.  Disposition: [] ED /[x] Urgent Care (no appt availability in office) / [] Appointment(In office/virtual)/ []  [facility]/ [] Home Care/ [x] Refused Recommended Disposition /[] [facility]/ []  Follow-up with PCP Additional Notes: The patient's daughter reports that the patient has had worsening confusion over the last 4 days after having a problem with her oxygen at night. She reports that the worsened confusion has been constant since that time. She states that she is concerned she may have a urinary tract infection, but does not believe the patient has a history of UTIs. She report that the patient has also had difficulty sleeping. I advised her to have the patient seen today at urgent care for testing and evaluation but she reported she was hoping that she would not need to have the patient seen in person. She states that she will talk to the patient and see if she is willing to be seen.   Reason for Disposition  [1] Longstanding confusion (e.g., dementia, stroke) AND [2] worsening  Answer Assessment - Initial Assessment Questions 1. LEVEL OF CONSCIOUSNESS: "How is he (she, the patient) acting right now?" (e.g., alert-oriented, confused, lethargic, stuporous, comatose)     Confused 2. ONSET: "When did the confusion start?"  (minutes, hours, days)     4 days ago 3. PATTERN "Does this come and go, or has it been constant since it started?"  "Is it present now?"     Constant since Friday 4.  ALCOHOL or DRUGS: "Has he been drinking alcohol or taking any drugs?"      None 5. NARCOTIC MEDICINES: "Has he been receiving any narcotic medications?" (e.g., morphine, Vicodin)     None 6. CAUSE: "What do you think is causing the confusion?"      Believes she might have a urinary tract infection 7. OTHER SYMPTOMS: "Are there any other symptoms?" (e.g., difficulty breathing, headache, fever, weakness)     "Exhaustion, but she doesn't sleep well",  Protocols used: Confusion - Delirium-A-AH

## 2023-09-10 NOTE — Telephone Encounter (Signed)
Spoke with pt's daughter per dpr. Daughter is unsure if pt may have a UTI and was wondering if she could be checked for one without coming into the office. In the triage note it says pt was having issues with her oxygen and confusion started after this. Asked daughter about this and she states last Friday pt went without oxygen for a short period of time and that during this time pt became flustered. Pt wears oxygen constantly and has for years. Daughter states 911 was called that night to evaluate pt and states she is unsure how low pt's oxygen may have dropped but states it was a short amount of time she went without oxygen. Daughter states after this happened is when confusion started. Confusion has gotten better throughout the week but would still like for her to be tested for UTI. Pt denies having any symptoms to daughter. Advised daughter that pt needs to be seen in person because confusion may be related to oxygen dropping rather than UTI. Daughter did not want to make an appt because pt does not like being seen. Informed daughter I would speak with DOD to see what they advise, Dr Dettinger also advised pt needs to be evaluated in person and if she cannot be seen in person by [place] needs to go to ER. Informed daughter of DOD advise and daughter still reluctant on making appt for pt to be seen because "what can you do at this point if there is damage from lack of oxygen" informed daughter we could test pt for UTI to rule that out but it would be up to the provider on next steps if UTI negative. Daughter said she would tell the pt her options but she would let pt make decision if she wants to be seen or not.

## 2023-09-15 ENCOUNTER — Telehealth: Payer: Self-pay

## 2023-09-15 NOTE — Telephone Encounter (Signed)
Are they talking about her 02 testing? Please review

## 2023-09-15 NOTE — Telephone Encounter (Signed)
Copied from CRM [id number]. Topic: Clinical - Lab/Test Results >> Sep 15, 2023 12:46 PM Monisha R wrote: Reason for HYQ:MVHQION is missing the 1st part of the assessment walk test. Calling in from Herb from (unable to distinguish accent) Give call when able to give information  [phone]

## 2023-09-18 NOTE — Telephone Encounter (Signed)
TC back to [facility] Read the below documentation from 11/25 OV note, the first line must include/read ' at rest on room air'.    Oxygen sat with no oxygen 87% Not able to exercise due to weakness- stays in wheel chair most of the time Oxygen sat at 6L via canula 96-99 %

## 2023-09-22 ENCOUNTER — Telehealth: Payer: Self-pay | Admitting: Nurse Practitioner

## 2023-09-22 NOTE — Telephone Encounter (Signed)
Copied from CRM [id number]. Topic: Medical Record Request - Provider/Facility Request >> Sep 22, 2023  2:11 PM Ivette P wrote: Reason for CRM: Hilda Lias from [facility] is calling in for an addendum for the patient. Requesting call a back [phone]

## 2023-09-25 ENCOUNTER — Telehealth: Payer: Self-pay

## 2023-09-25 NOTE — Telephone Encounter (Signed)
 Copied from CRM [id number]. Topic: Clinical - Medical Advice >> Sep 25, 2023  4:20 PM Delon DASEN wrote: Reason for CRM: daughter Rosaline calling about testing done for oxygen, [facility] needs results of room air at rest test if that was done, please call Rosaline [phone].  [facility] Medicare restart team has called about there [phone]

## 2023-09-29 NOTE — Telephone Encounter (Signed)
 TC back to [place] that OV notes were faxed back to [facility] today

## 2023-09-29 NOTE — Telephone Encounter (Signed)
 OV notes faxed back to [facility] at [phone]

## 2023-09-30 ENCOUNTER — Telehealth: Payer: Self-pay

## 2023-09-30 NOTE — Telephone Encounter (Signed)
 Copied from CRM [id number]. Topic: General - Other >> Sep 30, 2023 11:12 AM Susanna ORN wrote: Reason for CRM: Marcey, from [facility], an oxygen provider calling to check if the fax he sent over yesterday has been received. Checked and advised per note in chart, the fax has been received as of today, 09/30/23, and will be sent to Dr. Gladis for her to sign and send back. Marcey stated to please fax documents back to [phone].

## 2023-10-03 NOTE — Telephone Encounter (Signed)
 Form faxed to [facility]

## 2023-10-07 ENCOUNTER — Ambulatory Visit: Payer: Medicare Other | Admitting: Emergency Medicine

## 2023-10-10 ENCOUNTER — Emergency Department (HOSPITAL_COMMUNITY): Payer: Medicare Other

## 2023-10-10 ENCOUNTER — Other Ambulatory Visit: Payer: Self-pay

## 2023-10-10 ENCOUNTER — Encounter (HOSPITAL_COMMUNITY): Payer: Self-pay | Admitting: Emergency Medicine

## 2023-10-10 ENCOUNTER — Inpatient Hospital Stay (HOSPITAL_COMMUNITY): Payer: Medicare Other

## 2023-10-10 ENCOUNTER — Inpatient Hospital Stay (HOSPITAL_COMMUNITY)
Admission: EM | Admit: 2023-10-10 | Discharge: 2023-10-16 | DRG: 069 | Disposition: A | Discharge status: Skilled Nursing Facility | Payer: Medicare Other | Attending: Internal Medicine | Admitting: Internal Medicine

## 2023-10-10 DIAGNOSIS — J479 Bronchiectasis, uncomplicated: Secondary | ICD-10-CM | POA: Diagnosis present

## 2023-10-10 DIAGNOSIS — R680 Hypothermia, not associated with low environmental temperature: Secondary | ICD-10-CM | POA: Diagnosis present

## 2023-10-10 DIAGNOSIS — J9621 Acute and chronic respiratory failure with hypoxia: Secondary | ICD-10-CM | POA: Diagnosis not present

## 2023-10-10 DIAGNOSIS — B965 Pseudomonas (aeruginosa) (mallei) (pseudomallei) as the cause of diseases classified elsewhere: Secondary | ICD-10-CM | POA: Diagnosis present

## 2023-10-10 DIAGNOSIS — Z1152 Encounter for screening for COVID-19: Secondary | ICD-10-CM

## 2023-10-10 DIAGNOSIS — R601 Generalized edema: Secondary | ICD-10-CM

## 2023-10-10 DIAGNOSIS — R54 Age-related physical debility: Secondary | ICD-10-CM | POA: Diagnosis present

## 2023-10-10 DIAGNOSIS — G459 Transient cerebral ischemic attack, unspecified: Secondary | ICD-10-CM | POA: Diagnosis not present

## 2023-10-10 DIAGNOSIS — Z825 Family history of asthma and other chronic lower respiratory diseases: Secondary | ICD-10-CM

## 2023-10-10 DIAGNOSIS — Z833 Family history of diabetes mellitus: Secondary | ICD-10-CM

## 2023-10-10 DIAGNOSIS — N179 Acute kidney failure, unspecified: Secondary | ICD-10-CM | POA: Diagnosis not present

## 2023-10-10 DIAGNOSIS — Z66 Do not resuscitate: Secondary | ICD-10-CM | POA: Diagnosis present

## 2023-10-10 DIAGNOSIS — Z7902 Long term (current) use of antithrombotics/antiplatelets: Secondary | ICD-10-CM

## 2023-10-10 DIAGNOSIS — Z87891 Personal history of nicotine dependence: Secondary | ICD-10-CM | POA: Diagnosis not present

## 2023-10-10 DIAGNOSIS — E8809 Other disorders of plasma-protein metabolism, not elsewhere classified: Secondary | ICD-10-CM | POA: Diagnosis present

## 2023-10-10 DIAGNOSIS — K219 Gastro-esophageal reflux disease without esophagitis: Secondary | ICD-10-CM | POA: Diagnosis present

## 2023-10-10 DIAGNOSIS — Z9981 Dependence on supplemental oxygen: Secondary | ICD-10-CM

## 2023-10-10 DIAGNOSIS — Z79899 Other long term (current) drug therapy: Secondary | ICD-10-CM | POA: Diagnosis not present

## 2023-10-10 DIAGNOSIS — R2990 Unspecified symptoms and signs involving the nervous system: Secondary | ICD-10-CM | POA: Diagnosis present

## 2023-10-10 DIAGNOSIS — T68XXXA Hypothermia, initial encounter: Secondary | ICD-10-CM

## 2023-10-10 DIAGNOSIS — Z9049 Acquired absence of other specified parts of digestive tract: Secondary | ICD-10-CM

## 2023-10-10 DIAGNOSIS — R7881 Bacteremia: Secondary | ICD-10-CM | POA: Diagnosis present

## 2023-10-10 DIAGNOSIS — Z823 Family history of stroke: Secondary | ICD-10-CM | POA: Diagnosis not present

## 2023-10-10 LAB — COMPREHENSIVE METABOLIC PANEL
ALT: 25 U/L (ref 0–44)
AST: 32 U/L (ref 15–41)
Albumin: 3.1 g/dL — ABNORMAL LOW (ref 3.5–5.0)
Alkaline Phosphatase: 81 U/L (ref 38–126)
Anion gap: 4 — ABNORMAL LOW (ref 5–15)
BUN: 18 mg/dL (ref 8–23)
CO2: 38 mmol/L — ABNORMAL HIGH (ref 22–32)
Calcium: 8.6 mg/dL — ABNORMAL LOW (ref 8.9–10.3)
Chloride: 94 mmol/L — ABNORMAL LOW (ref 98–111)
Creatinine, Ser: 0.69 mg/dL (ref 0.44–1.00)
GFR, Estimated: >60 mL/min (ref >60)
Glucose, Bld: 103 mg/dL — ABNORMAL HIGH (ref 70–99)
Potassium: 4.4 mmol/L (ref 3.5–5.1)
Sodium: 136 mmol/L (ref 135–145)
Total Bilirubin: 0.6 mg/dL (ref 0.0–1.2)
Total Protein: 6.1 g/dL — ABNORMAL LOW (ref 6.5–8.1)

## 2023-10-10 LAB — CBC WITH DIFFERENTIAL/PLATELET
Abs Immature Granulocytes: 0.02 10*3/uL (ref 0.00–0.07)
Basophils Absolute: 0 10*3/uL (ref 0.0–0.1)
Basophils Relative: 1 %
Eosinophils Absolute: 0.1 10*3/uL (ref 0.0–0.5)
Eosinophils Relative: 2 %
HCT: 34.2 % — ABNORMAL LOW (ref 36.0–46.0)
Hemoglobin: 10.5 g/dL — ABNORMAL LOW (ref 12.0–15.0)
Immature Granulocytes: 1 %
Lymphocytes Relative: 20 %
Lymphs Abs: 0.8 10*3/uL (ref 0.7–4.0)
MCH: 29.2 pg (ref 26.0–34.0)
MCHC: 30.7 g/dL (ref 30.0–36.0)
MCV: 95.3 fL (ref 80.0–100.0)
Monocytes Absolute: 0.4 10*3/uL (ref 0.1–1.0)
Monocytes Relative: 10 %
Neutro Abs: 2.8 10*3/uL (ref 1.7–7.7)
Neutrophils Relative %: 66 %
Platelets: 131 10*3/uL — ABNORMAL LOW (ref 150–400)
RBC: 3.59 MIL/uL — ABNORMAL LOW (ref 3.87–5.11)
RDW: 14.6 % (ref 11.5–15.5)
WBC: 4.1 10*3/uL (ref 4.0–10.5)
nRBC: 0 % (ref 0.0–0.2)

## 2023-10-10 LAB — TROPONIN I (HIGH SENSITIVITY)
Troponin I (High Sensitivity): 9 ng/L (ref <18)
Troponin I (High Sensitivity): 9 ng/L (ref <18)

## 2023-10-10 LAB — ECHOCARDIOGRAM COMPLETE BUBBLE STUDY
AR max vel: 2.27 cm2
AV Area VTI: 2.55 cm2
AV Area mean vel: 2.83 cm2
AV Mean grad: 4.3 mm[Hg]
AV Peak grad: 11.7 mm[Hg]
Ao pk vel: 1.71 m/s
Area-P 1/2: 3.48 cm2
Calc EF: 62.3 %
S' Lateral: 1.9 cm
Single Plane A2C EF: 56.3 %
Single Plane A4C EF: 68.3 %

## 2023-10-10 LAB — LACTIC ACID, PLASMA
Lactic Acid, Venous: 0.5 mmol/L (ref 0.5–1.9)
Lactic Acid, Venous: 0.4 mmol/L — ABNORMAL LOW (ref 0.5–1.9)

## 2023-10-10 LAB — URINALYSIS, ROUTINE W REFLEX MICROSCOPIC
Bilirubin Urine: NEGATIVE
Glucose, UA: NEGATIVE mg/dL
Hgb urine dipstick: NEGATIVE
Ketones, ur: NEGATIVE mg/dL
Leukocytes,Ua: NEGATIVE
Nitrite: NEGATIVE
Protein, ur: NEGATIVE mg/dL
Specific Gravity, Urine: 1.014 (ref 1.005–1.030)
pH: 7 (ref 5.0–8.0)

## 2023-10-10 LAB — PROCALCITONIN: Procalcitonin: <0.1 ng/mL

## 2023-10-10 LAB — LIPID PANEL
Cholesterol: 199 mg/dL (ref 0–200)
HDL: 115 mg/dL (ref >40)
LDL Cholesterol: 78 mg/dL (ref 0–99)
Total CHOL/HDL Ratio: 1.7 {ratio}
Triglycerides: 29 mg/dL (ref <150)
VLDL: 6 mg/dL (ref 0–40)

## 2023-10-10 LAB — TSH: TSH: 4.491 u[IU]/mL (ref 0.350–4.500)

## 2023-10-10 LAB — SARS CORONAVIRUS 2 BY RT PCR: SARS Coronavirus 2 by RT PCR: NEGATIVE

## 2023-10-10 LAB — BRAIN NATRIURETIC PEPTIDE: B Natriuretic Peptide: 52 pg/mL (ref 0.0–100.0)

## 2023-10-10 MED ORDER — CLOPIDOGREL BISULFATE 75 MG PO TABS
75.0000 mg | ORAL_TABLET | Freq: Every day | ORAL | Status: DC
  Administered 2023-10-10 – 2023-10-16 (×7): 75 mg via ORAL
  Filled 2023-10-10 (×7): qty 1

## 2023-10-10 MED ORDER — ASPIRIN 81 MG PO CHEW
81.0000 mg | CHEWABLE_TABLET | Freq: Every day | ORAL | Status: DC
  Administered 2023-10-10 – 2023-10-16 (×7): 81 mg via ORAL
  Filled 2023-10-10 (×7): qty 1

## 2023-10-10 MED ORDER — MOMETASONE FURO-FORMOTEROL FUM 100-5 MCG/ACT IN AERO
2.0000 | INHALATION_SPRAY | Freq: Two times a day (BID) | RESPIRATORY_TRACT | Status: DC
Start: 2023-10-10 — End: 2023-10-16
  Administered 2023-10-10 – 2023-10-16 (×12): 2 via RESPIRATORY_TRACT
  Filled 2023-10-10 (×2): qty 8.8

## 2023-10-10 MED ORDER — BUDESON-GLYCOPYRROL-FORMOTEROL 160-9-4.8 MCG/ACT IN AERO: 2.0000 | INHALATION_SPRAY | Freq: Two times a day (BID) | RESPIRATORY_TRACT | Status: DC

## 2023-10-10 MED ORDER — SODIUM CHLORIDE 0.9 % IV SOLN
1.0000 g | INTRAVENOUS | Status: DC
  Administered 2023-10-10: 1 g via INTRAVENOUS
  Filled 2023-10-10: qty 10

## 2023-10-10 MED ORDER — IPRATROPIUM-ALBUTEROL 0.5-2.5 (3) MG/3ML IN SOLN
3.0000 mL | Freq: Four times a day (QID) | RESPIRATORY_TRACT | Status: DC
  Administered 2023-10-10 – 2023-10-12 (×10): 3 mL via RESPIRATORY_TRACT
  Filled 2023-10-10 (×10): qty 3

## 2023-10-10 MED ORDER — GUAIFENESIN ER 600 MG PO TB12
600.0000 mg | ORAL_TABLET | Freq: Two times a day (BID) | ORAL | Status: DC | PRN
  Administered 2023-10-12: 600 mg via ORAL
  Filled 2023-10-10: qty 1

## 2023-10-10 MED ORDER — SODIUM CHLORIDE 0.9% FLUSH
3.0000 mL | Freq: Two times a day (BID) | INTRAVENOUS | Status: DC
  Administered 2023-10-10 – 2023-10-16 (×13): 3 mL via INTRAVENOUS

## 2023-10-10 MED ORDER — AZITHROMYCIN 250 MG PO TABS
500.0000 mg | ORAL_TABLET | Freq: Every day | ORAL | Status: AC
  Administered 2023-10-10 – 2023-10-12 (×3): 500 mg via ORAL
  Filled 2023-10-10 (×3): qty 2

## 2023-10-10 MED ORDER — ACETAMINOPHEN 500 MG PO TABS: 1000.0000 mg | ORAL_TABLET | Freq: Four times a day (QID) | ORAL | Status: DC | PRN

## 2023-10-10 MED ORDER — ALBUTEROL SULFATE (2.5 MG/3ML) 0.083% IN NEBU: 2.5000 mg | INHALATION_SOLUTION | RESPIRATORY_TRACT | Status: DC | PRN

## 2023-10-10 MED ORDER — FUROSEMIDE 40 MG PO TABS
40.0000 mg | ORAL_TABLET | Freq: Every day | ORAL | Status: DC
  Administered 2023-10-10: 40 mg via ORAL
  Filled 2023-10-10: qty 1

## 2023-10-10 MED ORDER — PANTOPRAZOLE SODIUM 40 MG PO TBEC
40.0000 mg | DELAYED_RELEASE_TABLET | Freq: Every day | ORAL | Status: DC
Start: 2023-10-10 — End: 2023-10-16
  Administered 2023-10-10 – 2023-10-16 (×7): 40 mg via ORAL
  Filled 2023-10-10 (×7): qty 1

## 2023-10-10 NOTE — ED Notes (Signed)
Patient off of bair hugger at this time due to normothermic temperature.

## 2023-10-10 NOTE — H&P (Signed)
History and Physical    Danielle Harmon WUJ:[id number] DOB: 05-01-1941 DOA: 10/10/2023  PCP: Bennie Pierini, FNP   Patient coming from: Home   Chief Complaint:  Chief Complaint  Patient presents with   Leg Swelling    HPI: History limited as she is a poor historian. Danielle Harmon is a 83 y.o. female with hx of severe COPD, CHRF on 6L O2, HTN, chronic LE edema, who presented due to R side tingling.  LKWT 1/16 around 10 PM.  She says she thinks she was awake at onset although does not remember.  She had reported previously about right arm tingling but on my interview she is not sure if it involved her face arm or leg but was definitely on the right side.  Says that currently it has resolved.  Duration somewhere in the range of 1 hour.  She denies any recent falls, headaches, vision changes.  She is unclear on speech changes not sure if she has occasional word finding difficulty or if she truly had an acute episode of aphasia yesterday.  She lives alone says that typically her house is hot and other people cannot stand it.  Says recently it has been colder in her house and was wearing a sweater, that her son may have turned down the temperature.  Denies any cold exposure outside.  Otherwise recently reports having more rhinorrhea.  She has a chronic cough and is unclear if this is changed recently.  When EMS arrived she initially declined coming to the hospital but they noted she had leg swelling and she decided to come in to have this checked.  Says that she has chronic leg swelling but appears to be worse recently.   Review of Systems:  ROS complete and negative except as marked above   Allergies  Allergen Reactions   Bee Venom Anaphylaxis    Prior to Admission medications   Medication Sig Start Date End Date Taking? Authorizing Provider  albuterol (PROVENTIL) (2.5 MG/3ML) 0.083% nebulizer solution Take 3 mLs (2.5 mg total) by nebulization every 6 (six) hours as needed for wheezing or  shortness of breath. Pt may use every 4 to 6 hours as needed 04/06/20   Coral Ceo, NP  albuterol (VENTOLIN HFA) 108 (90 Base) MCG/ACT inhaler TAKE 2 PUFFS BY MOUTH EVERY 6 HOURS AS NEEDED FOR WHEEZE OR SHORTNESS OF BREATH 08/22/23   Leslye Peer, MD  amLODipine (NORVASC) 5 MG tablet TAKE 1 TABLET (5 MG TOTAL) BY MOUTH DAILY. 05/27/23   Bennie Pierini, FNP  Budeson-Glycopyrrol-Formoterol (BREZTRI AEROSPHERE) 160-9-4.8 MCG/ACT AERO TAKE 2 PUFFS BY MOUTH TWICE A DAY 01/27/23   Daphine Deutscher, Mary-Margaret, FNP  diclofenac Sodium (VOLTAREN) 1 % GEL Apply topically.    [provider]  furosemide (LASIX) 20 MG tablet TAKE 2 TABLETS (40 MG TOTAL) BY MOUTH DAILY. 07/21/23   Daphine Deutscher, Mary-Margaret, FNP  guaiFENesin (MUCINEX) 600 MG 12 hr tablet Take 600 mg by mouth as needed.    [provider]  omeprazole (PRILOSEC) 20 MG capsule Take 1 capsule (20 mg total) by mouth daily. 01/27/23   Bennie Pierini, FNP  Spacer/Aero Chamber Mouthpiece MISC Use as directed 04/16/18   Leslye Peer, MD  triamcinolone (NASACORT) 55 MCG/ACT AERO nasal inhaler Place 2 sprays into the nose daily. 11/05/19   Remus Loffler, PA-C    Past Medical History:  Diagnosis Date   COPD (chronic obstructive pulmonary disease) (HCC)    GERD (gastroesophageal reflux disease)    Hypertension  Past Surgical History:  Procedure Laterality Date   CHOLECYSTECTOMY     CYSTECTOMY     benign cysts removed from head and R hand   KNEE ARTHROSCOPY     Left     reports that she quit smoking about 21 years ago. Her smoking use included cigarettes. She started smoking about 61 years ago. She has a 40 pack-year smoking history. She has never used smokeless tobacco. She reports that she does not currently use alcohol. She reports that she does not use drugs.  Family History  Problem Relation Age of Onset   Emphysema Father    COPD Father    Stroke Mother    Diabetes Son      Physical Exam: Vitals:    10/10/23 0445 10/10/23 0500 10/10/23 0515 10/10/23 0530  BP: 139/84  133/61 131/69  Pulse: 70 70 66 67  Resp: (!) 23 20 20 18   Temp:  (!) 93.3 F (34.1 C)    TempSrc:  Rectal    SpO2: 94% 96% 99% 99%  Weight:      Height:        Gen: Awake, alert, NAD, chronically ill-appearing CV: Regular, normal S1, S2, no murmurs  Resp: Normal WOB, on 4 L O2, diminished air movement, rales in the left posterior base Abd: Flat, normoactive, nontender MSK: Symmetric, there is 2-3+ pitting edema to the knees Skin: No rashes or lesions to exposed skin  Neuro: Alert and interactive, fully oriented, speech is clear no aphasia, CN II through XII intact, motor is 5 out of 5 and symmetric.  Sensation with hypoesthesia in the right upper extremity and right lower extremity compared to the left. Psych: euthymic, appropriate    Data review:   Labs reviewed, notable for:   Lactate 0.5 Bicarb 38, other chemistries unremarkable BNP 52, high-sensitivity troponin 9 WBC 4.1  Micro:  Results for orders placed or performed during the hospital encounter of 10/10/23  Culture, blood (routine x 2)     Status: None (Preliminary result)   Collection Time: 10/10/23  4:31 AM   Specimen: BLOOD  Result Value Ref Range Status   Specimen Description BLOOD BLOOD RIGHT ARM  Final   Special Requests   Final    BOTTLES DRAWN AEROBIC AND ANAEROBIC Blood Culture adequate volume Performed at [facility], [address] [place], [place] [address]    Culture PENDING  Incomplete   Report Status PENDING  Incomplete  Culture, blood (routine x 2)     Status: None (Preliminary result)   Collection Time: 10/10/23  4:33 AM   Specimen: BLOOD  Result Value Ref Range Status   Specimen Description BLOOD BLOOD RIGHT HAND  Final   Special Requests   Final    BOTTLES DRAWN AEROBIC AND ANAEROBIC Blood Culture adequate volume Performed at [facility], [address], [place], [place] [address]    Culture PENDING  Incomplete    Report Status PENDING  Incomplete    Imaging reviewed:  DG Chest Port 1 View Result Date: 10/10/2023 CLINICAL DATA:  Leg swelling, lethargy EXAM: PORTABLE CHEST 1 VIEW COMPARISON:  10/08/2019 FINDINGS: Mild retrocardiac/left lower lobe opacity, atelectasis versus pneumonia. Right lung is clear. No frank interstitial edema. No pleural effusion or pneumothorax. Cardiomegaly. IMPRESSION: Mild retrocardiac/left lower lobe opacity, atelectasis versus pneumonia. Cardiomegaly.  No frank interstitial edema. Electronically Signed   By: Charline Bills M.D.   On: 10/10/2023 03:41    ED course:  Hypothermic, Placed on bair hugger with appropriate rewarming  Assessment/Plan:  83 y.o. female with hx severe COPD, CHRF on 6L O2, HTN, chronic LE edema, who presented due to R side tingling, incidental finding of mild hypothermia, possible CAP.   Stroke-like symptoms  LKWT 1/16 around 10 PM. Developed R sided paresthesia x 1 hr approximately. VS hypothermic to 33.3 C, otherwise wnl. On exam RUE and RLE hypoesthesia. Blood glucose 103. Etiology of her symptoms unclear, she is currently out of window for TNK. Case is equivocal and history is vague, will proceed with MRI to r/o stroke.  -MRI brain without contrast to rule out stroke -Swallow screen if passes okay for heart healthy diet -Serial neurochecks, telemetry -Hold blood pressure medications for permissive hypertension until MRI result -Additional evaluation pending results of MRI  Mild hypothermia On presentation rectal temp 33.3C.  Etiology unclear, possible that she had cold exposure within her home will need to confirm safety at home.  Other considerations possibly central if truly a stroke.  -Data processing manager until greater than 35 c then okay for heating packs and blankets -Check TSH, thiamine.  Less likely adrenal insufficiency -TOC involvement to ensure that she has safe home environment  Possible community-acquired  pneumonia Provides vague history possible URI symptoms.  Currently on less than her home O2, prescribed 6 L.  Currently on 4 L.  Chest x-ray with left retrocardiac opacity. -Start ceftriaxone 1 g IV every 24 hours, azithromycin 500 mg p.o. daily -Check flu/COVID/RSV, sputum if she can produce a sample - Continue home Breztri or equivalent, DuoNeb every 6 hours scheduled, albuterol every 4 hours as needed, I-S, flutter valve.  Lower extremity edema History of chronic lower extremity edema dating back years described similar in severity, chronic diuretic therapy.  Likely lymphedema or venous insufficiency.  BNP within normal limits. - Compression and elevation, continue home Lasix 40 mg p.o. daily  Deconditioning - PT/OT evaluation  Chronic medical problems: COPD, CHRF on 6 L O2: Without COPD exacerbation. Home inhalers and additional breathing treatments per above. Hypertension: Hold home amlodipine pending MRI result.  Body mass index is 25.34 kg/m.    DVT prophylaxis:  SCDs Code Status:  DNR/DNI(Do NOT Intubate); confirmed with patient  Diet:  Diet Orders (From admission, onward)     Start     Ordered   10/10/23 0610  Diet Heart Room service appropriate? Yes; Fluid consistency: Thin  Diet effective now       Comments: OK for HH diet if passes swallow screen  Question Answer Comment  Room service appropriate? Yes   Fluid consistency: Thin      10/10/23 0614           Family Communication:  No   Consults:  None   Admission status:   Inpatient, Telemetry bed  Severity of Illness: The appropriate patient status for this patient is INPATIENT. Inpatient status is judged to be reasonable and necessary in order to provide the required intensity of service to ensure the patient's safety. The patient's presenting symptoms, physical exam findings, and initial radiographic and laboratory data in the context of their chronic comorbidities is felt to place them at high risk for further  clinical deterioration. Furthermore, it is not anticipated that the patient will be medically stable for discharge from the hospital within 2 midnights of admission.   * I certify that at the point of admission it is my clinical judgment that the patient will require inpatient hospital care spanning beyond 2 midnights from the point of admission due to high  intensity of service, high risk for further deterioration and high frequency of surveillance required.*   Dolly Rias, MD [facility]  How to contact the [facility] Attending or Consulting provider 7A - 7P or covering provider during after hours 7P -7A, for this patient.  Check the care team in [facility] and look for a) attending/consulting [facility] provider listed and b) the [facility] team listed Log into [email or web address] and use [place]'s universal password to access. If you do not have the password, please contact the hospital operator. Locate the [facility] provider you are looking for under [facility] and page to a number that you can be directly reached. If you still have difficulty reaching the provider, please page the [facility] (Director on Call) for the Hospitalists listed on amion for assistance.  10/10/2023, 6:14 AM

## 2023-10-10 NOTE — Evaluation (Signed)
Physical Therapy Evaluation Patient Details Name: Danielle Harmon MRN: [id number] DOB: June 20, 1941 Today's Date: 10/10/2023  History of Present Illness  History limited as she is a poor historian.  Danielle Harmon is a 83 y.o. female with hx of severe COPD, CHRF on 6L O2, HTN, chronic LE edema, who presented due to R side tingling.  LKWT 1/16 around 10 PM.  She says she thinks she was awake at onset although does not remember.  She had reported previously about right arm tingling but on my interview she is not sure if it involved her face arm or leg but was definitely on the right side.  Says that currently it has resolved.  Duration somewhere in the range of 1 hour.  She denies any recent falls, headaches, vision changes.  She is unclear on speech changes not sure if she has occasional word finding difficulty or if she truly had an acute episode of aphasia yesterday.  She lives alone says that typically her house is hot and other people cannot stand it.  Says recently it has been colder in her house and was wearing a sweater, that her son may have turned down the temperature.  Denies any cold exposure outside.  Otherwise recently reports having more rhinorrhea.  She has a chronic cough and is unclear if this is changed recently.  When EMS arrived she initially declined coming to the hospital but they noted she had leg swelling and she decided to come in to have this checked.  Says that she has chronic leg swelling but appears to be worse recently.   Clinical Impression  Patient was in bed at beginning of session and was agreeable to therapy. Patient required min/mod assistance with bed mobility. Patient was able to sit EOB with feet supported and BUE unsupported. With the use of RW patient was able to stand. PT gave CTG/min assist to patient when standing and ambulating to the [facility] Following transfer to [facility] patient was able to take a few steps forward before needing to sit back in bed due to fatigue. O2 level was  increased 6 L to help with SpO2 level decreasing. O2 was dropped back down to 4L once SpO2 levels got back to above 90%. Patient was helped back in to bed at conclusion of session. Patient will benefit from continued skilled physical therapy in hospital and recommended venue below to increase strength, balance, endurance for safe ADLs and gait.          If plan is discharge home, recommend the following: Help with stairs or ramp for entrance;Assistance with cooking/housework;A lot of help with bathing/dressing/bathroom;A lot of help with walking and/or transfers   Can travel by private vehicle        Equipment Recommendations None recommended by PT  Recommendations for Other Services       Functional Status Assessment Patient has had a recent decline in their functional status and demonstrates the ability to make significant improvements in function in a reasonable and predictable amount of time.     Precautions / Restrictions Precautions Precautions: Fall Restrictions Weight Bearing Restrictions Per Provider Order: No      Mobility  Bed Mobility Overal bed mobility: Needs Assistance Bed Mobility: Supine to Sit     Supine to sit: Contact guard, Min assist, HOB elevated       Patient Response: Cooperative  Transfers Overall transfer level: Needs assistance Equipment used: Rolling walker (2 wheels) Transfers: Sit to/from Stand, Bed to chair/wheelchair/BSC Sit to Stand: Contact  guard assist, Min assist   Step pivot transfers: Contact guard assist, Min assist       General transfer comment: Labored movement; SOB with O2 desatruation. Poor endurance.    Ambulation/Gait Ambulation/Gait assistance: Min assist, Contact guard assist Gait Distance (Feet): 5 Feet Assistive device: Rolling walker (2 wheels) Gait Pattern/deviations: Step-to pattern, Decreased step length - right, Decreased step length - left, Decreased stride length Gait velocity: slow     General Gait  Details: Slow labored movement  Stairs            Wheelchair Mobility     Tilt Bed Tilt Bed Patient Response: Cooperative  Modified Rankin (Stroke Patients Only)       Balance Overall balance assessment: Needs assistance Sitting-balance support: No upper extremity supported, Feet supported Sitting balance-Leahy Scale: Fair Sitting balance - Comments: seated at EOB   Standing balance support: Bilateral upper extremity supported, During functional activity, Reliant on assistive device for balance Standing balance-Leahy Scale: Fair Standing balance comment: using RW                             Pertinent Vitals/Pain Pain Assessment Pain Assessment: Faces Faces Pain Scale: Hurts little more Pain Location: head Pain Descriptors / Indicators: Headache Pain Intervention(s): Limited activity within patient's tolerance, Monitored during session, Repositioned    Home Living Family/patient expects to be discharged to:: Private residence Living Arrangements: Alone Available Help at Discharge: Family;Available PRN/intermittently Type of Home: House Home Access: Stairs to enter Entrance Stairs-Rails: None Entrance Stairs-Number of Steps: 3   Home Layout: One level Home Equipment: Agricultural consultant (2 wheels);Rollator (4 wheels);BSC/3in1;Wheelchair - manual      Prior Function Prior Level of Function : Needs assist       Physical Assist : ADLs (physical)   ADLs (physical): IADLs Mobility Comments: [place] ambulator with RW ADLs Comments: Independent ADL; assist IADL's.     Extremity/Trunk Assessment   Upper Extremity Assessment Upper Extremity Assessment: Defer to OT evaluation    Lower Extremity Assessment Lower Extremity Assessment: Generalized weakness    Cervical / Trunk Assessment Cervical / Trunk Assessment: Kyphotic  Communication   Communication Communication: No apparent difficulties Cueing Techniques: Verbal cues;Tactile cues   Cognition Arousal: Alert Behavior During Therapy: WFL for tasks assessed/performed Overall Cognitive Status: Within Functional Limits for tasks assessed                                          General Comments      Exercises     Assessment/Plan    PT Assessment Patient needs continued PT services  PT Problem List Decreased strength;Decreased range of motion;Decreased activity tolerance;Decreased balance;Decreased mobility;Decreased coordination       PT Treatment Interventions DME instruction;Gait training;Stair training;Functional mobility training;Therapeutic activities;Therapeutic exercise;Balance training;Patient/family education    PT Goals (Current goals can be found in the Care Plan section)  Acute Rehab PT Goals Patient Stated Goal: To return home PT Goal Formulation: With patient Time For Goal Achievement: 10/10/23 Potential to Achieve Goals: Good    Frequency Min 3X/week     Co-evaluation PT/OT/SLP Co-Evaluation/Treatment: Yes Reason for Co-Treatment: To address functional/ADL transfers PT goals addressed during session: Mobility/safety with mobility         AM-PAC PT "6 Clicks" Mobility  Outcome Measure Help needed turning from your back to your side while  in a flat bed without using bedrails?: A Lot Help needed moving from lying on your back to sitting on the side of a flat bed without using bedrails?: A Lot Help needed moving to and from a bed to a chair (including a wheelchair)?: A Lot Help needed standing up from a chair using your arms (e.g., wheelchair or bedside chair)?: A Lot Help needed to walk in hospital room?: A Lot Help needed climbing 3-5 steps with a railing? : Total 6 Click Score: 11    End of Session Equipment Utilized During Treatment: Oxygen Activity Tolerance: Patient tolerated treatment well Patient left: in bed;with call bell/phone within reach Nurse Communication: Mobility status PT Visit Diagnosis:  Unsteadiness on feet (R26.81);Other abnormalities of gait and mobility (R26.89);Muscle weakness (generalized) (M62.81)    Time: 4098-1191 PT Time Calculation (min) (ACUTE ONLY): 30 min   Charges:   PT Evaluation $PT Eval Moderate Complexity: 1 Mod PT Treatments $Therapeutic Activity: 23-37 mins PT General Charges $$ ACUTE PT VISIT: 1 Visit         Rieley Hausman SPT

## 2023-10-10 NOTE — ED Notes (Signed)
Provider at bedside

## 2023-10-10 NOTE — ED Notes (Signed)
Respiratory at bedside.

## 2023-10-10 NOTE — ED Provider Notes (Signed)
[place] EMERGENCY DEPARTMENT AT [facility] Provider Note   CSN: [id number] Arrival date & time: 10/10/23  0241     History  Chief Complaint  Patient presents with   Leg Swelling    Danielle Harmon is a 83 y.o. female.  Patient is a 83 year old female with past medical history of hypertension, COPD on home oxygen, GERD, prior cholecystectomy.  Patient presenting today for evaluation of of right arm tingling.  She noticed this this evening and was concerned she might be having a stroke.  She then pressed her medical alert button and was transported here.  Initially, her vital signs are unremarkable and patient was declining transport, however decided to come be evaluated due to swollen legs.  Patient has no other complaints, but does arrive here with a temperature of 91.9 rectally.  The history is provided by the patient.       Home Medications Prior to Admission medications   Medication Sig Start Date End Date Taking? Authorizing Provider  albuterol (PROVENTIL) (2.5 MG/3ML) 0.083% nebulizer solution Take 3 mLs (2.5 mg total) by nebulization every 6 (six) hours as needed for wheezing or shortness of breath. Pt may use every 4 to 6 hours as needed 04/06/20   Coral Ceo, NP  albuterol (VENTOLIN HFA) 108 (90 Base) MCG/ACT inhaler TAKE 2 PUFFS BY MOUTH EVERY 6 HOURS AS NEEDED FOR WHEEZE OR SHORTNESS OF BREATH 08/22/23   Leslye Peer, MD  amLODipine (NORVASC) 5 MG tablet TAKE 1 TABLET (5 MG TOTAL) BY MOUTH DAILY. 05/27/23   Bennie Pierini, FNP  Budeson-Glycopyrrol-Formoterol (BREZTRI AEROSPHERE) 160-9-4.8 MCG/ACT AERO TAKE 2 PUFFS BY MOUTH TWICE A DAY 01/27/23   Daphine Deutscher, Mary-Margaret, FNP  diclofenac Sodium (VOLTAREN) 1 % GEL Apply topically.    [provider]  furosemide (LASIX) 20 MG tablet TAKE 2 TABLETS (40 MG TOTAL) BY MOUTH DAILY. 07/21/23   Daphine Deutscher, Mary-Margaret, FNP  guaiFENesin (MUCINEX) 600 MG 12 hr tablet Take 600 mg by mouth as needed.    [provider]  omeprazole (PRILOSEC) 20 MG capsule Take 1 capsule (20 mg total) by mouth daily. 01/27/23   Bennie Pierini, FNP  Spacer/Aero Chamber Mouthpiece MISC Use as directed 04/16/18   Leslye Peer, MD  triamcinolone (NASACORT) 55 MCG/ACT AERO nasal inhaler Place 2 sprays into the nose daily. 11/05/19   Remus Loffler, PA-C      Allergies    Bee venom    Review of Systems   Review of Systems  All other systems reviewed and are negative.   Physical Exam Updated Vital Signs BP 138/63 (BP Location: Right Arm)   Pulse 68   Temp (!) 91.9 F (33.3 C) (Rectal)   Resp 16   Ht 5\' 6"  (1.676 m)   Wt 71.2 kg   SpO2 96%   BMI 25.34 kg/m  Physical Exam Vitals and nursing note reviewed.  Constitutional:      General: She is not in acute distress.    Appearance: She is well-developed. She is not diaphoretic.  HENT:     Head: Normocephalic and atraumatic.  Cardiovascular:     Rate and Rhythm: Normal rate and regular rhythm.     Heart sounds: No murmur heard.    No friction rub. No gallop.  Pulmonary:     Effort: Pulmonary effort is normal. No respiratory distress.     Breath sounds: Normal breath sounds. No wheezing.  Abdominal:     General: Bowel sounds are normal. There  is no distension.     Palpations: Abdomen is soft.     Tenderness: There is no abdominal tenderness.  Musculoskeletal:        General: Normal range of motion.     Cervical back: Normal range of motion and neck supple.     Right lower leg: Edema present.     Left lower leg: Edema present.     Comments: There is 2-3+ pitting edema both lower extremities.  Skin:    General: Skin is warm and dry.  Neurological:     General: No focal deficit present.     Mental Status: She is alert and oriented to person, place, and time.     Cranial Nerves: No cranial nerve deficit.     Motor: No weakness.     ED Results / Procedures / Treatments   Labs (all labs ordered are listed, but only abnormal results are  displayed) Labs Reviewed - No data to display  EKG EKG Interpretation Date/Time:  Friday October 10 2023 03:08:46 EST Ventricular Rate:  68 PR Interval:  238 QRS Duration:  143 QT Interval:  446 QTC Calculation: 475 R Axis:   -55  Text Interpretation: Sinus rhythm Prolonged PR interval Consider left atrial enlargement RBBB and LAFB Confirmed by Geoffery Lyons ([id number]) on 10/10/2023 3:11:05 AM  Radiology No results found.  Procedures Procedures    Medications Ordered in ED Medications - No data to display  ED Course/ Medical Decision Making/ A&P  Patient is an 83 year old female brought by EMS for evaluation of leg swelling and tingling of her right arm.  Patient arrives here with rectal temp of 91.5, but otherwise unremarkable physical examination.  Workup initiated including CBC, CMP, troponin, BNP, and lactate.  All studies have returned and are basically unremarkable.  There is no leukocytosis or electrolyte derangement.  Blood cultures have also been obtained and are pending.  Urinalysis is clear.  Chest x-ray shows possible retrocardiac infiltrate, however patient not reporting any cough or shortness of breath.  Patient was placed on the Bair hugger and body temperature has improved to 93.  Due to the hypothermia, I am uncomfortable sending this patient home and will discuss care with the hospitalist.  I have spoken with Dr. Lazarus Salines who agrees to admit.  Final Clinical Impression(s) / ED Diagnoses Final diagnoses:  None    Rx / DC Orders ED Discharge Orders     None         Geoffery Lyons, MD 10/10/23 [phone]

## 2023-10-10 NOTE — TOC Initial Note (Signed)
Transition of Care [facility]) - Initial/Assessment Note    Patient Details  Name: Danielle Harmon MRN: [id number] Date of Birth: 1941-04-16  Transition of [facility]) CM/SW Contact:    Villa Herb, LCSWA Phone Number: 10/10/2023, 12:18 PM  Clinical Narrative:                 CSW updated that PT is recommending SNF for pt at D/C. CSW spoke with pt at bedside to complete assessment. Pt lives alone. CSW reviewed SNF recommendation with pt, she is agreeable to SNF referral being sent to both [place] and [place] counties. CSW to complete referral and send out for review. TOC to follow.   Expected Discharge Plan: Skilled Nursing Facility Barriers to Discharge: Continued Medical Work up   Patient Goals and CMS Choice Patient states their goals for this hospitalization and ongoing recovery are:: go to SNF CMS Medicare.gov Compare Post Acute Care list provided to:: Patient Choice offered to / list presented to : Patient [place] ownership interest in [facility].provided to:: Patient    Expected Discharge Plan and Services In-house Referral: Clinical Social Work Discharge Planning Services: CM Consult Post Acute Care Choice: Skilled Nursing Facility Living arrangements for the past 2 months: Single Family Home                                      Prior Living Arrangements/Services Living arrangements for the past 2 months: Single Family Home Lives with:: Self Patient language and need for interpreter reviewed:: Yes Do you feel safe going back to the place where you live?: Yes      Need for Family Participation in Patient Care: Yes (Comment) Care giver support system in place?: Yes (comment)   Criminal Activity/Legal Involvement Pertinent to Current Situation/Hospitalization: No - Comment as needed  Activities of Daily Living   ADL Screening (condition at time of admission) Independently performs ADLs?: Yes (appropriate for developmental age) Is the patient  deaf or have difficulty hearing?: No Does the patient have difficulty seeing, even when wearing glasses/contacts?: No Does the patient have difficulty concentrating, remembering, or making decisions?: No  Permission Sought/Granted                  Emotional Assessment Appearance:: Appears stated age Attitude/Demeanor/Rapport: Engaged Affect (typically observed): Accepting Orientation: : Oriented to Self, Oriented to Place, Oriented to  Time, Oriented to Situation Alcohol / Substance Use: Not Applicable Psych Involvement: No (comment)  Admission diagnosis:  Stroke-like episode [R29.90] Hypothermia, initial encounter [T68.XXXA] Patient Active Problem List   Diagnosis Date Noted   Stroke-like episode 10/10/2023   Peripheral edema 01/12/2021   BMI 34.0-34.9,adult 07/14/2020   Chronic respiratory failure with hypoxia (HCC) 04/06/2020   Hypertension 04/26/2019   Allergic rhinitis 06/03/2016   Gastro-esophageal reflux disease without esophagitis 06/01/2014   COPD (chronic obstructive pulmonary disease) with emphysema (HCC) 02/21/2011   PCP:  Bennie Pierini, FNP Pharmacy:   [facility] [id number] - [place],  - [address] [place] [address] Phone: [phone] Fax: [phone]     Social Drivers of Health (SDOH) Social History: SDOH Screenings   Food Insecurity: No Food Insecurity (10/10/2023)  Housing: Low Risk  (10/10/2023)  Transportation Needs: No Transportation Needs (10/10/2023)  Utilities: Not At Risk (10/10/2023)  Alcohol Screen: Low Risk  (07/10/2023)  Depression (PHQ2-9): Low Risk  (08/18/2023)  Financial Resource Strain: Low Risk  (  08/17/2023)  Physical Activity: Unknown (08/17/2023)  Recent Concern: Physical Activity - Inactive (07/10/2023)  Social Connections: Unknown (08/17/2023)  Recent Concern: Social Connections - Socially Isolated (07/10/2023)  Stress: No Stress Concern Present (08/17/2023)  Tobacco Use: Medium  Risk (10/10/2023)  Health Literacy: Adequate Health Literacy (07/10/2023)   SDOH Interventions:     Readmission Risk Interventions    10/10/2023   12:17 PM  Readmission Risk Prevention Plan  Medication Screening Complete  Transportation Screening Complete

## 2023-10-10 NOTE — Plan of Care (Signed)
  Problem: Acute Rehab PT Goals(only PT should resolve) Goal: Pt Will Go Supine/Side To Sit Flowsheets (Taken 10/10/2023 1422) Pt will go Supine/Side to Sit: with contact guard assist Goal: Patient Will Transfer Sit To/From Stand Flowsheets (Taken 10/10/2023 1422) Patient will transfer sit to/from stand: with contact guard assist Goal: Pt Will Transfer Bed To Chair/Chair To Bed Flowsheets (Taken 10/10/2023 1422) Pt will Transfer Bed to Chair/Chair to Bed: with contact guard assist Goal: Pt Will Ambulate Flowsheets (Taken 10/10/2023 1422) Pt will Ambulate:  with rolling walker  with contact guard assist  25 feet   Denis Koppel SPT

## 2023-10-10 NOTE — Progress Notes (Signed)
SLP Cancellation Note  Patient Details Name: Danielle Harmon MRN: [id number] DOB: 1941/09/10   Cancelled treatment:       Reason Eval/Treat Not Completed: Pt passed the [facility] stroke swallowing screen and diet was initiated which negates full BSE. Per RN, Pt is tolerating diet without difficulty. Our service will sign off, thank you,  Jovita Persing H. Romie Levee, CCC-SLP Speech Language Pathologist    Georgetta Haber 10/10/2023, 9:23 AM

## 2023-10-10 NOTE — Consult Note (Signed)
I connected with  Danielle Harmon on 10/10/23 by a video enabled telemedicine application and verified that I am speaking with the correct person using two identifiers.   I discussed the limitations of evaluation and management by telemedicine. The patient expressed understanding and agreed to proceed.  Location [facility] Location of physician: [facility]   Neurology Consultation Reason for Consult: stroke Referring Physician: Dr Hazeline Junker  CC: right sided numbness  History is obtained from: patient, chart review  HPI: Danielle Harmon is a 83 y.o. female with PMH of HTN who presented with right sided numbness. Patient is a poor historian. States yesterday evening around 10pm she noted her right arm and leg felt numb, for about an hour. Denies similar symptoms in past.  LKN 2200 on 10/09/2023 No tpa as outside window and mild resolving symptoms No thrombectomy as outside window and no LVO Event happened at home mRS 3   ROS: All other systems reviewed and negative except as noted in the HPI.   Past Medical History:  Diagnosis Date   COPD (chronic obstructive pulmonary disease) (HCC)    GERD (gastroesophageal reflux disease)    Hypertension     Family History  Problem Relation Age of Onset   Emphysema Father    COPD Father    Stroke Mother    Diabetes Son     Social History:  reports that she quit smoking about 21 years ago. Her smoking use included cigarettes. She started smoking about 61 years ago. She has a 40 pack-year smoking history. She has never used smokeless tobacco. She reports that she does not currently use alcohol. She reports that she does not use drugs.  Medications Prior to Admission  Medication Sig Dispense Refill Last Dose/Taking   albuterol (PROVENTIL) (2.5 MG/3ML) 0.083% nebulizer solution Take 3 mLs (2.5 mg total) by nebulization every 6 (six) hours as needed for wheezing or shortness of breath. Pt may use every 4 to 6 hours as needed 75 mL  12    albuterol (VENTOLIN HFA) 108 (90 Base) MCG/ACT inhaler TAKE 2 PUFFS BY MOUTH EVERY 6 HOURS AS NEEDED FOR WHEEZE OR SHORTNESS OF BREATH 8.5 each 5    amLODipine (NORVASC) 5 MG tablet TAKE 1 TABLET (5 MG TOTAL) BY MOUTH DAILY. 90 tablet 0    Budeson-Glycopyrrol-Formoterol (BREZTRI AEROSPHERE) 160-9-4.8 MCG/ACT AERO TAKE 2 PUFFS BY MOUTH TWICE A DAY 10.7 g 5    diclofenac Sodium (VOLTAREN) 1 % GEL Apply topically.      furosemide (LASIX) 20 MG tablet TAKE 2 TABLETS (40 MG TOTAL) BY MOUTH DAILY. 180 tablet 0    guaiFENesin (MUCINEX) 600 MG 12 hr tablet Take 600 mg by mouth as needed.      omeprazole (PRILOSEC) 20 MG capsule Take 1 capsule (20 mg total) by mouth daily. 90 capsule 1    Spacer/Aero Chamber Mouthpiece MISC Use as directed 1 each 0    triamcinolone (NASACORT) 55 MCG/ACT AERO nasal inhaler Place 2 sprays into the nose daily. 1 Inhaler 12       Exam: Current vital signs: BP 137/65 (BP Location: Right Arm)   Pulse 86   Temp 97.6 F (36.4 C) (Oral)   Resp (!) 23   Ht 5\' 6"  (1.676 m)   Wt 71.2 kg   SpO2 95%   BMI 25.34 kg/m  Vital signs in last 24 hours: Temp:  [91.5 F (33.1 C)-97.6 F (36.4 C)] 97.6 F (36.4 C) (01/17 1204) Pulse Rate:  [65-89]  86 (01/17 1204) Resp:  [15-23] 23 (01/17 1038) BP: (123-144)/(56-84) 137/65 (01/17 1204) SpO2:  [94 %-99 %] 95 % (01/17 1204) Weight:  [71.2 kg] 71.2 kg (01/17 0312)   Physical Exam  Constitutional: Appears well-developed and well-nourished.  Psych: Affect appropriate to situation Neuro: AOX3, CN 2-12 grossly intact, antigravity strength in all extremities without drift, sensory intact to light touch, FTN intact   NIHSS 0  I have reviewed labs in epic and the results pertinent to this consultation are: CBC:  Recent Labs  Lab 10/10/23 0326  WBC 4.1  NEUTROABS 2.8  HGB 10.5*  HCT 34.2*  MCV 95.3  PLT 131*    Basic Metabolic Panel:  Lab Results  Component Value Date   NA 136 10/10/2023   K 4.4 10/10/2023    CO2 38 (H) 10/10/2023   GLUCOSE 103 (H) 10/10/2023   BUN 18 10/10/2023   CREATININE 0.69 10/10/2023   CALCIUM 8.6 (L) 10/10/2023   GFRNONAA >60 10/10/2023   GFRAA 80 07/14/2020   Lipid Panel:  Lab Results  Component Value Date   LDLCALC 87 01/27/2023   HgbA1c: No results found for: "HGBA1C" Urine Drug Screen: No results found for: "LABOPIA", "COCAINSCRNUR", "LABBENZ", "AMPHETMU", "THCU", "LABBARB"  Alcohol Level No results found for: "ETH"   I have reviewed the images obtained:  MRI Brain wo contrast 10/10/2023: No acute intracranial process.   ASSESSMENT/PLAN: 83yo female with transient right sided numbness  TIA - Etiology, likely small vessel disease vs embolic  Recommendations: - MRA head wo contrast to look for intracranial stenosis - carotid US to look for carotid stenosis - TTE to look for thrombus - can do CTA head and neck instead of MRA head and carotid US if patient otherwise ready for dc - ASA 81mg  daily for sec stroke prevention - plavix 75mg  daily for 3 weeks in no intracranial stenosis. Plavix 75mg  for 3 months if intracranial stenosis - atorva 40mg  daily if LDL >70 ( lipid panel ordered and pending) - A1 pending, defer management to primary team - if symptomatic carotid stenosis, please refer to vascular surgery - if TTE negative for thrombus and MRA as well as carotid US unremarkable, recommend cardiac monitor for paroxysmal A fib - goal BP: permissive HTN for 24 hours followed by normotension - PT/OT - Stroke educations - f/u with neuro in 3months ( order placed) - plan discussed with Dr Jarvis Newcomer via secure chat  Thank you for allowing [place] to participate in the care of this patient. If you have any further questions, please contact  me or neurohospitalist.   Lindie Spruce Epilepsy [facility]

## 2023-10-10 NOTE — Progress Notes (Signed)
  Echocardiogram 2D Echocardiogram has been performed.  Danielle Harmon 10/10/2023, 2:21 PM

## 2023-10-10 NOTE — Progress Notes (Signed)
[facility] PROGRESS NOTE  Danielle Harmon (DOB: August 25, 1941) VHQ:[id number] PCP: Bennie Pierini, FNP  Brief Narrative: Danielle Harmon is an 83 y.o. female with a history of 6L O2-dependent COPD, HTN, and GERD who presented to the ED on 10/10/2023 with right arm tingling (per EDP note) and/or right hemibody numbness (current report) with concern for stroke. She also reported increasing leg swelling. Vital signs were stable, requiring 4L O2 (whereas baseline reported to be 6L) with the exception of hypothermia (rectal temperature of 91.15F) without other evidence of sepsis. Blood work was essentially Doctor, general practice. CXR demonstrated a possible retrocardiac opacity (infiltrate vs. atelectasis), UA negative. EDP requested admission for hypothermia this morning. CT head had shown no acute abnormality and subsequent MRI brain was ordered, has resulted without evidence of stroke. Neurology is consulted for further evaluation.  Subjective: No further weakness or tingling or numbness since being here. She does not feel cold or feverish or sick. Her concern is mostly her legs. Says she doesn't see doctors as much as she should because of transportation difficulties and mobility impairment.   Objective: BP 137/65 (BP Location: Right Arm)   Pulse 86   Temp 97.6 F (36.4 C) (Oral)   Resp (!) 23   Ht 5\' 6"  (1.676 m)   Wt 71.2 kg   SpO2 95%   BMI 25.34 kg/m   Gen: Elderly frail female in no distress Pulm: Diminished but no wheezes or crackles. Nonlabored on 3-4L O2  CV: RRR, no MRG or JVD. 2+ pitting LE edema. GI: Soft, NT, ND, +BS  Neuro: Alert, she is oriented but slow to respond at times, diffusely but not focally weak in cranial or peripheral nerves. No sensory deficit reported on exam.  Ext: Warm, dry, LE edema with no discoloration or palpable deformity noted.   Assessment & Plan: Right sided numbness: Transient, no weakness. Has durably resolved. CT nonacute, MRI nonacute with  chronic microvascular changes.  - PT/OT/SLP, echo, lipid panel, A1c - Neurology consulted, adding US carotid, MRA head  LLL CAP: Unable to rule out pneumonia. Pt denies cough but was coughing during our encounter.  - Check full viral panel (negative covid swab) - Monitor blood cultures - Plan to cover with 5 days of typical abx  Chronic hypoxic respiratory failure, COPD:  - Continue supplemental oxygen at her baseline - Continue bronchodilators, scheduled duoneb, so changed breztri to dulera for now, and prn albuterol. No exacerbation currently noted  Hypothermia: No clear etiology, has resolved since arrival. Lactic acid normal, WBC normal, no hypotension.  - Monitor blood cultures - Abx as above.   Lower extremity edema: Pt reports some chronicity to R > L edema but more abrupt LLE edema, currently symmetrical. Hypoalbuminemia likely contributing, also suspect venous insufficiency, though no dermatitis/siderosis, etc. Negative dipstick protein on UA.  - Check echocardiogram (none available for review in our system) to evaluate specifically for RV compromise in setting of chronic hypoxia. Note cardiomegaly on CXR. - R/o DVT w/venous U/S - Elevate, compression - Continue PTA lasix.  - Holding PTA norvasc (for permissive HTN purposes, but also ?if contributing to edema)  GERD:  - Continue PPI  [name], MD [facility] [email or web address] 10/10/2023, 12:47 PM

## 2023-10-10 NOTE — NC FL2 (Signed)
[place] MEDICAID [id number] LEVEL OF CARE FORM     IDENTIFICATION  Patient Name: Danielle Harmon Birthdate: 02/24/1941 Sex: female Admission Date (Current Location): 10/10/2023  [facility] and [place] Number:  [employer] and Address:  [facility],  [address], [place] [address]      Provider Number: [id number]  Attending Physician Name and Address:  [name], MD  Relative Name and Phone Number:       Current Level of Care: Hospital Recommended Level of Care: Skilled Nursing Facility Prior Approval Number:    Date Approved/Denied:   PASRR Number: [id number]  Discharge Plan: SNF    Current Diagnoses: Patient Active Problem List   Diagnosis Date Noted   Stroke-like episode 10/10/2023   Peripheral edema 01/12/2021   BMI 34.0-34.9,adult 07/14/2020   Chronic respiratory failure with hypoxia (HCC) 04/06/2020   Hypertension 04/26/2019   Allergic rhinitis 06/03/2016   Gastro-esophageal reflux disease without esophagitis 06/01/2014   COPD (chronic obstructive pulmonary disease) with emphysema (HCC) 02/21/2011    Orientation RESPIRATION BLADDER Height & Weight     Self, Time, Situation, Place  O2 (See D/C summary) Continent Weight: 156 lb 15.5 oz (71.2 kg) Height:  5\' 6"  (167.6 cm)  BEHAVIORAL SYMPTOMS/MOOD NEUROLOGICAL BOWEL NUTRITION STATUS      Continent Diet (Heart healthy)  AMBULATORY STATUS COMMUNICATION OF NEEDS Skin   Extensive Assist Verbally Normal                       Personal Care Assistance Level of Assistance  Bathing, Feeding, Dressing Bathing Assistance: Limited assistance Feeding assistance: Independent Dressing Assistance: Limited assistance     Functional Limitations Info  Sight, Hearing, Speech Sight Info: Adequate Hearing Info: Adequate Speech Info: Adequate    SPECIAL CARE FACTORS FREQUENCY  PT (By licensed PT), OT (By licensed OT)     PT Frequency: 5 times weekly OT Frequency: 5 times weekly             Contractures Contractures Info: Not present    Additional Factors Info  Code Status, Allergies Code Status Info: DNR Allergies Info: Bee venom           Current Medications (10/10/2023):  This is the current hospital active medication list Current Facility-Administered Medications  Medication Dose Route Frequency Provider Last Rate Last Admin   acetaminophen (TYLENOL) tablet 1,000 mg  1,000 mg Oral Q6H PRN Segars, Christiane Ha, MD       albuterol (PROVENTIL) (2.5 MG/3ML) 0.083% nebulizer solution 2.5 mg  2.5 mg Nebulization Q4H PRN Segars, Christiane Ha, MD       azithromycin (ZITHROMAX) tablet 500 mg  500 mg Oral Daily Segars, Christiane Ha, MD   500 mg at 10/10/23 0910   cefTRIAXone (ROCEPHIN) 1 g in sodium chloride 0.9 % 100 mL IVPB  1 g Intravenous Q24H Dolly Rias, MD   Stopped at 10/10/23 0731   furosemide (LASIX) tablet 40 mg  40 mg Oral Daily Segars, Christiane Ha, MD   40 mg at 10/10/23 0910   guaiFENesin (MUCINEX) 12 hr tablet 600 mg  600 mg Oral BID PRN Dolly Rias, MD       ipratropium-albuterol (DUONEB) 0.5-2.5 (3) MG/3ML nebulizer solution 3 mL  3 mL Nebulization Q6H Segars, Christiane Ha, MD   3 mL at 10/10/23 0923   mometasone-formoterol (DULERA) 100-5 MCG/ACT inhaler 2 puff  2 puff Inhalation BID [name], MD       pantoprazole (PROTONIX) EC tablet 40 mg  40  mg Oral Daily Dolly Rias, MD   40 mg at 10/10/23 0910   sodium chloride flush (NS) 0.9 % injection 3 mL  3 mL Intravenous Q12H Dolly Rias, MD   3 mL at 10/10/23 0911     Discharge Medications: Please see discharge summary for a list of discharge medications.  Relevant Imaging Results:  Relevant Lab Results:   Additional Information SSN: [id number] [place]

## 2023-10-10 NOTE — Evaluation (Signed)
Occupational Therapy Evaluation Patient Details Name: Danielle Harmon MRN: [id number] DOB: 1941-08-10 Today's Date: 10/10/2023   History of Present Illness History limited as she is a poor historian.  Danielle Harmon is a 83 y.o. female with hx of severe COPD, CHRF on 6L O2, HTN, chronic LE edema, who presented due to R side tingling.  LKWT 1/16 around 10 PM.  She says she thinks she was awake at onset although does not remember.  She had reported previously about right arm tingling but on my interview she is not sure if it involved her face arm or leg but was definitely on the right side.  Says that currently it has resolved.  Duration somewhere in the range of 1 hour.  She denies any recent falls, headaches, vision changes.  She is unclear on speech changes not sure if she has occasional word finding difficulty or if she truly had an acute episode of aphasia yesterday.  She lives alone says that typically her house is hot and other people cannot stand it.  Says recently it has been colder in her house and was wearing a sweater, that her son may have turned down the temperature.  Denies any cold exposure outside.  Otherwise recently reports having more rhinorrhea.  She has a chronic cough and is unclear if this is changed recently.  When EMS arrived she initially declined coming to the hospital but they noted she had leg swelling and she decided to come in to have this checked.  Says that she has chronic leg swelling but appears to be worse recently. (per MD)   Clinical Impression   Pt agreeable to OT and PT co-evaluation. Pt on 4 L at start of session but had to be moved up to 5 LPM supplemental O2 due to desaturation and shortness of breath. Noted to desaturate to 82% at one point. In the 90s by the end of the session. Pt required CGA to min A for transfer to [facility] and supervision for bed mobility and lower body dressing. Pt limited by weakness and poor endurance. Pt currently lives alone. Pt left in the bed  with call bell within reach. Pt will benefit from continued OT in the hospital and recommended venue below to increase strength, balance, and endurance for safe ADL's.          If plan is discharge home, recommend the following: A little help with walking and/or transfers;A little help with bathing/dressing/bathroom;Assistance with cooking/housework;Assist for transportation;Help with stairs or ramp for entrance    Functional Status Assessment  Patient has had a recent decline in their functional status and demonstrates the ability to make significant improvements in function in a reasonable and predictable amount of time.  Equipment Recommendations  None recommended by OT    Recommendations for Other Services       Precautions / Restrictions Precautions Precautions: Fall Restrictions Weight Bearing Restrictions Per Provider Order: No      Mobility Bed Mobility Overal bed mobility: Needs Assistance Bed Mobility: Supine to Sit     Supine to sit: Contact guard, Min assist, HOB elevated          Transfers Overall transfer level: Needs assistance Equipment used: Rolling walker (2 wheels) Transfers: Sit to/from Stand, Bed to chair/wheelchair/BSC Sit to Stand: Contact guard assist, Min assist     Step pivot transfers: Contact guard assist, Min assist     General transfer comment: Labored movement; SOB with O2 desatruation. Poor endurance.      Balance  Overall balance assessment: Needs assistance Sitting-balance support: No upper extremity supported, Feet supported Sitting balance-Leahy Scale: Fair Sitting balance - Comments: seated at EOB   Standing balance support: Bilateral upper extremity supported, During functional activity, Reliant on assistive device for balance Standing balance-Leahy Scale: Fair Standing balance comment: using RW                           ADL either performed or assessed with clinical judgement   ADL Overall ADL's : Needs  assistance/impaired Eating/Feeding: Modified independent;Sitting   Grooming: Set up;Sitting       Lower Body Bathing: Set up;Sitting/lateral leans;Supervison/ safety   Upper Body Dressing : Set up;Sitting   Lower Body Dressing: Set up;Sitting/lateral leans;Supervision/safety Lower Body Dressing Details (indicate cue type and reason): Able to don socks seated at [facility] Toilet Transfer: Contact guard assist;Minimal assistance;Rolling walker (2 wheels);Stand-pivot;BSC/3in1 Statistician Details (indicate cue type and reason): EOB to [facility] with RW Toileting- Clothing Manipulation and Hygiene: Supervision/safety;Set up;Sitting/lateral lean       Functional mobility during ADLs: Contact guard assist;Minimal assistance;Rolling walker (2 wheels)       Vision Baseline Vision/History: 1 Wears glasses Ability to See in Adequate Light: 1 Impaired Patient Visual Report: No change from baseline Vision Assessment?: Wears glasses for reading;No apparent visual deficits     Perception Perception: Not tested       Praxis Praxis: Not tested       Pertinent Vitals/Pain Pain Assessment Pain Assessment: Faces Faces Pain Scale: Hurts little more Pain Location: head Pain Descriptors / Indicators: Headache Pain Intervention(s): Limited activity within patient's tolerance, Monitored during session, Repositioned     Extremity/Trunk Assessment Upper Extremity Assessment Upper Extremity Assessment: Generalized weakness   Lower Extremity Assessment Lower Extremity Assessment: Defer to PT evaluation   Cervical / Trunk Assessment Cervical / Trunk Assessment: Kyphotic   Communication Communication Communication: No apparent difficulties   Cognition Arousal: Alert Behavior During Therapy: WFL for tasks assessed/performed Overall Cognitive Status: Within Functional Limits for tasks assessed                                                        Home Living  Family/patient expects to be discharged to:: Private residence Living Arrangements: Alone Available Help at Discharge: Family;Available PRN/intermittently Type of Home: House Home Access: Stairs to enter [employer] of Steps: 3 Entrance Stairs-Rails: None Home Layout: One level     Bathroom Shower/Tub: Sponge bathes at baseline   Bathroom Toilet: Standard Bathroom Accessibility: No   Home Equipment: Agricultural consultant (2 wheels);Rollator (4 wheels);BSC/3in1;Wheelchair - manual          Prior Functioning/Environment Prior Level of Function : Needs assist       Physical Assist : ADLs (physical)   ADLs (physical): IADLs Mobility Comments: [place] ambulator with RW ADLs Comments: Independent ADL; assist IADL's.        OT Problem List: Decreased strength;Decreased activity tolerance;Cardiopulmonary status limiting activity      OT Treatment/Interventions: Self-care/ADL training;Therapeutic exercise;Therapeutic activities;Patient/family education    OT Goals(Current goals can be found in the care plan section) Acute Rehab OT Goals Patient Stated Goal: Improve function OT Goal Formulation: With patient Time For Goal Achievement: 10/24/23 Potential to Achieve Goals: Good  OT Frequency: Min 1X/week    Co-evaluation  PT/OT/SLP Co-Evaluation/Treatment: Yes Reason for Co-Treatment: To address functional/ADL transfers   OT goals addressed during session: ADL's and self-care                       End of Session Equipment Utilized During Treatment: Rolling walker (2 wheels);Oxygen  Activity Tolerance: Patient tolerated treatment well Patient left: in bed;with call bell/phone within reach  OT Visit Diagnosis: Unsteadiness on feet (R26.81);Other abnormalities of gait and mobility (R26.89);Muscle weakness (generalized) (M62.81)                Time: 7829-5621 OT Time Calculation (min): 26 min Charges:  OT General Charges $OT Visit: 1 Visit OT  Evaluation $OT Eval Low Complexity: 1 Low  Huxley Shurley OT, MOT   Danie Chandler 10/10/2023, 9:49 AM

## 2023-10-10 NOTE — ED Triage Notes (Addendum)
Pt arrived from home via EMS [place] cc of swollen legs. EMS endorses they were called to pts home for right arm tingling. EMS endorses that the pts stroke scale was negative for them. EMS endorses that VSS for them with BGL of 116. EMS endorses pt has Hx of COPD with home Oxygen of 8L. EMS placed pt on 5L [place] en route. 20g IV R wrist.  EMS endorses that the pt wanted to refuse transport upon their arrival after noticing that her vitals were stable however she then asked for her swollen legs to be examined. EMS endorses talking to son who endorses that the pt normally has swollen legs and is on medication for this. EMS noted bilateral Lower extremity +4 pitting edema. Son also endorsed to EMS that the pt sounded like her normal self.

## 2023-10-10 NOTE — ED Notes (Signed)
Patient back from MRI.

## 2023-10-10 NOTE — Plan of Care (Signed)
  Problem: Acute Rehab OT Goals (only OT should resolve) Goal: Pt. Will Perform Grooming Flowsheets (Taken 10/10/2023 0952) Pt Will Perform Grooming:  with modified independence  standing Goal: Pt. Will Perform Lower Body Dressing Flowsheets (Taken 10/10/2023 0952) Pt Will Perform Lower Body Dressing: with modified independence Goal: Pt. Will Transfer To Toilet Flowsheets (Taken 10/10/2023 [phone]) Pt Will Transfer to Toilet: with modified independence Goal: Pt. Will Perform Toileting-Clothing Manipulation Flowsheets (Taken 10/10/2023 0952) Pt Will Perform Toileting - Clothing Manipulation and hygiene: with modified independence Goal: Pt/Caregiver Will Perform Home Exercise Program Flowsheets (Taken 10/10/2023 [phone]) Pt/caregiver will Perform Home Exercise Program:  Increased strength  Both right and left upper extremity  Independently  Trevelle Mcgurn OT, MOT

## 2023-10-10 NOTE — ED Notes (Signed)
PT/OT at bedside.

## 2023-10-10 NOTE — ED Notes (Signed)
Patient in MRI at this time. 

## 2023-10-11 ENCOUNTER — Inpatient Hospital Stay (HOSPITAL_COMMUNITY): Payer: Medicare Other

## 2023-10-11 DIAGNOSIS — R2990 Unspecified symptoms and signs involving the nervous system: Secondary | ICD-10-CM | POA: Diagnosis not present

## 2023-10-11 LAB — CBC
HCT: 34.2 % — ABNORMAL LOW (ref 36.0–46.0)
Hemoglobin: 10.7 g/dL — ABNORMAL LOW (ref 12.0–15.0)
MCH: 29.6 pg (ref 26.0–34.0)
MCHC: 31.3 g/dL (ref 30.0–36.0)
MCV: 94.5 fL (ref 80.0–100.0)
Platelets: 146 10*3/uL — ABNORMAL LOW (ref 150–400)
RBC: 3.62 MIL/uL — ABNORMAL LOW (ref 3.87–5.11)
RDW: 14.9 % (ref 11.5–15.5)
WBC: 4.9 10*3/uL (ref 4.0–10.5)
nRBC: 0 % (ref 0.0–0.2)

## 2023-10-11 LAB — BLOOD CULTURE ID PANEL (REFLEXED) - BCID2

## 2023-10-11 LAB — HEMOGLOBIN A1C
Hgb A1c MFr Bld: 4.7 % — ABNORMAL LOW (ref 4.8–5.6)
Mean Plasma Glucose: 88.19 mg/dL

## 2023-10-11 LAB — BASIC METABOLIC PANEL
Anion gap: 8 (ref 5–15)
BUN: 20 mg/dL (ref 8–23)
CO2: 40 mmol/L — ABNORMAL HIGH (ref 22–32)
Calcium: 8.9 mg/dL (ref 8.9–10.3)
Chloride: 88 mmol/L — ABNORMAL LOW (ref 98–111)
Creatinine, Ser: 0.88 mg/dL (ref 0.44–1.00)
GFR, Estimated: >60 mL/min (ref >60)
Glucose, Bld: 87 mg/dL (ref 70–99)
Potassium: 4.4 mmol/L (ref 3.5–5.1)
Sodium: 136 mmol/L (ref 135–145)

## 2023-10-11 LAB — GLUCOSE, CAPILLARY: Glucose-Capillary: 110 mg/dL — ABNORMAL HIGH (ref 70–99)

## 2023-10-11 LAB — PHOSPHORUS: Phosphorus: 3.6 mg/dL (ref 2.5–4.6)

## 2023-10-11 LAB — MRSA NEXT GEN BY PCR, NASAL: MRSA by PCR Next Gen: NOT DETECTED

## 2023-10-11 LAB — MAGNESIUM: Magnesium: 2.1 mg/dL (ref 1.7–2.4)

## 2023-10-11 MED ORDER — FUROSEMIDE 10 MG/ML IJ SOLN: 40.0000 mg | Freq: Once | INTRAMUSCULAR | Status: DC

## 2023-10-11 MED ORDER — ATORVASTATIN CALCIUM 40 MG PO TABS
40.0000 mg | ORAL_TABLET | Freq: Every day | ORAL | Status: DC
Start: 2023-10-11 — End: 2023-10-16
  Administered 2023-10-13 – 2023-10-16 (×4): 40 mg via ORAL
  Filled 2023-10-11 (×5): qty 1

## 2023-10-11 MED ORDER — PIPERACILLIN-TAZOBACTAM 3.375 G IVPB
3.3750 g | Freq: Three times a day (TID) | INTRAVENOUS | Status: DC
  Administered 2023-10-11 – 2023-10-13 (×7): 3.375 g via INTRAVENOUS
  Filled 2023-10-11 (×7): qty 50

## 2023-10-11 MED ORDER — IOHEXOL 300 MG/ML  SOLN
100.0000 mL | Freq: Once | INTRAMUSCULAR | Status: AC | PRN
  Administered 2023-10-11: 100 mL via INTRAVENOUS

## 2023-10-11 MED ORDER — SODIUM CHLORIDE 3 % IN NEBU: 4.0000 mL | INHALATION_SOLUTION | Freq: Once | RESPIRATORY_TRACT | Status: DC

## 2023-10-11 MED ORDER — FUROSEMIDE 10 MG/ML IJ SOLN
40.0000 mg | Freq: Every day | INTRAMUSCULAR | Status: DC
  Administered 2023-10-11: 40 mg via INTRAVENOUS
  Filled 2023-10-11: qty 4

## 2023-10-11 MED ORDER — ALBUTEROL SULFATE (2.5 MG/3ML) 0.083% IN NEBU: 2.5000 mg | INHALATION_SOLUTION | Freq: Once | RESPIRATORY_TRACT | Status: DC

## 2023-10-11 MED ORDER — CHLORHEXIDINE GLUCONATE CLOTH 2 % EX PADS
6.0000 | MEDICATED_PAD | Freq: Every day | CUTANEOUS | Status: DC
  Administered 2023-10-11 – 2023-10-15 (×5): 6 via TOPICAL

## 2023-10-11 NOTE — Progress Notes (Signed)
   10/11/23 0451  Provider Notification  Provider Name/Title Segars, MD  Date Provider Notified 10/11/23  Time Provider Notified 0451  Method of Notification Page  Notification Reason Critical Result  Test performed and critical result BCx bacteria ID- Pseudomonas aeruginosa  Date Critical Result Received 10/11/23  Time Critical Result Received [phone]

## 2023-10-11 NOTE — Progress Notes (Signed)
Pt has desaturated multiple times during the night. Pt yelled out for help again and SP02 was 75% on 5 L. MD notified. Bumped Pt's Oxygen up and was able to wean again. Pt currently 97 on 5 L.

## 2023-10-11 NOTE — Plan of Care (Signed)
  Problem: Elimination: Goal: Will not experience complications related to urinary retention Outcome: Progressing   Problem: Pain Managment: Goal: General experience of comfort will improve and/or be controlled Outcome: Progressing   Problem: Safety: Goal: Ability to remain free from injury will improve Outcome: Progressing   Problem: Skin Integrity: Goal: Risk for impaired skin integrity will decrease Outcome: Progressing

## 2023-10-11 NOTE — TOC Progression Note (Signed)
Transition of Care [facility]) - Progression Note    Patient Details  Name: Danielle Harmon MRN: [id number] Date of Birth: 1941/01/10  Transition of Care [facility] CM/SW Contact  Villa Herb, [place] Phone Number: 10/11/2023, 3:13 PM  Clinical Narrative:    CSW spoke to pts daughter who states she works for [employer] and she prefers her mother go to SNF at [facility] in [place], she has already spoke with facility about this. CSW to speak with facility on Monday to send referral in for review. TOC to follow.   Expected Discharge Plan: Skilled Nursing Facility Barriers to Discharge: Continued Medical Work up  Expected Discharge Plan and Services In-house Referral: Clinical Social Work Discharge Planning Services: CM Consult Post Acute Care Choice: Skilled Nursing Facility Living arrangements for the past 2 months: Single Family Home                                       Social Determinants of Health (SDOH) Interventions SDOH Screenings   Food Insecurity: No Food Insecurity (10/10/2023)  Housing: Low Risk  (10/10/2023)  Transportation Needs: No Transportation Needs (10/10/2023)  Utilities: Not At Risk (10/10/2023)  Alcohol Screen: Low Risk  (07/10/2023)  Depression (PHQ2-9): Low Risk  (08/18/2023)  Financial Resource Strain: Low Risk  (08/17/2023)  Physical Activity: Unknown (08/17/2023)  Recent Concern: Physical Activity - Inactive (07/10/2023)  Social Connections: Unknown (10/10/2023)  Stress: No Stress Concern Present (08/17/2023)  Tobacco Use: Medium Risk (10/10/2023)  Health Literacy: Adequate Health Literacy (07/10/2023)    Readmission Risk Interventions    10/10/2023   12:17 PM  Readmission Risk Prevention Plan  Medication Screening Complete  Transportation Screening Complete

## 2023-10-11 NOTE — Progress Notes (Signed)
Placed patient on CPAP but running settings as Bipap at 10/5 with 5L bled in.  Patient is having some fluid retention with slight swelling of her lower extremities and is getting lasix.  Patient seems to be tolerating well, O2 sat between 96 to 97%.  Will monitor while she sleeps.

## 2023-10-11 NOTE — Progress Notes (Signed)
   10/11/23 0125  Provider Notification  Provider Name/Title Segars, MD  Date Provider Notified 10/11/23  Time Provider Notified 0125  Method of Notification Page  Notification Reason Critical Result  Test performed and critical result BCx- gram negative rods  Date Critical Result Received 10/11/23  Time Critical Result Received 0118

## 2023-10-11 NOTE — Progress Notes (Signed)
[facility] PROGRESS NOTE  Johnnita Fouch (DOB: Mar 13, 1941) ZOX:[id number] PCP: Bennie Pierini, FNP  Brief Narrative: Najha Mofford is an 83 y.o. female with a history of 6L O2-dependent COPD, HTN, and GERD who presented to the ED on 10/10/2023 with right arm tingling/numbness with concern for stroke. She also reported increasing leg swelling. Vital signs were stable, requiring 4L O2 (whereas baseline reported to be 6L) with the exception of hypothermia (rectal temperature of 91.52F) without other evidence of sepsis. Blood work was essentially Doctor, general practice. CXR demonstrated a possible retrocardiac opacity (infiltrate vs. atelectasis), UA negative. EDP requested admission for hypothermia this morning. CT head had shown no acute abnormality and subsequent MRI brain was ordered, has resulted without evidence of stroke. Neurology is consulted for further evaluation.  Subjective: Had desaturation noted overnight, feels ok now. Her daughter says she's been reporting poor vision of late. She ate her breakfast unassisted this morning. No further numbness and no focal weakness. No fevers.   Objective: BP (!) 126/59   Pulse 79   Temp (!) 97.3 F (36.3 C) (Oral)   Resp (!) 26   Ht 5\' 6"  (1.676 m)   Wt 71.8 kg   SpO2 99%   BMI 25.55 kg/m   Gen: Frail, elderly female in no distress.  Pulm: Crackles at left base, no wheezes. Tachypneic with increased effort with nasal cannula dislodged. Once returned to her nose, SpO2 up to mid-high 90%'s and normal effort  CV: RRR, no MRG, stable LE edema, no dermatitis, no JVD GI: Soft, NT, ND, +BS Neuro: Alert and oriented, slowed. No new focal deficits. Ext: Warm, no deformities Skin: Significant ecchymoses UE's, no rashes or ulcers on visualized skin   Assessment & Plan: Right sided numbness, TIA: CT nonacute, MRI nonacute with chronic microvascular changes. MRA without LVO/significant stenosis, carotid U/S with < 49% stenosis. Echo w/bubble  study is negative for IAS/CES.  - PT/OT/SLP > will pursue SNF rehab.  - Neurology consulted, recommending DAPT x3 weeks, then aspirin monotherapy, neurology follow up after discharge.  - With negative work up as above, cardiac monitoring after discharge is recommended. Will remain on telemetry while here.  - LDL is 78 (though HDL is 115!), so will start atorvastatin - HbA1c is in process, typically may be several days.   LLL CAP/bronchiectasis: Unable to rule out pneumonia, ?etiology of Pseudomonas bacteremia. Full viral panel remains pending.  - Zosyn with duration as defined below, azithromycin (planned 5 days)   Pseudomonas bacteremia:  - Changed CTX to zosyn, will tailor to susceptibility data. CT abd/pelvis without nidus for infection, UA was negative. Odd presentation with hypothermia but no other evidence of sepsis (normal WBC, hemodynamics), and the fact that hypothermia resolved with antibiotics that would not be active against this pathogen.    Acute on chronic hypoxic respiratory failure, COPD:  - Continue supplemental oxygen at her baseline. Daughter reports that she often sleeps mouth breathing and had episode inpatient overnight into 1/18 where she was found doing this with SpO2 in 70%'s. She has returned to her baseline which is 6L O2 and is with normal respiratory effort and SpO2 in mid-high 90%'s.  - Continue bronchodilators, scheduled duoneb, so changed breztri to dulera for now, and prn albuterol. No exacerbation currently noted, so avoiding steroids.   Lower extremity edema: This is a chronic issue. LE U/S negative for DVT. Echo without significant overload, small caliber IVC in fact, and NORMAL RVSF. Hypoalbuminemia likely contributing, also suspect venous insufficiency, though no dermatitis/siderosis, etc. Negative  dipstick protein on UA.  - Elevate, compression - Will hold a dose of lasix given the small IVC. Do not see pulmonary edema on CXR. - Holding PTA norvasc (for  permissive HTN purposes, but also ?if contributing to edema)  GERD:  - Continue PPI  Goals of care counseling, DNR (POA):  - DNR confirmed. Pt amenable to current measures, but would not consent to intubation/mechanical ventilation.   Exophytic left renal lesion: D/w pt's daughter the need for nonemergent MRI for further characterization and to r/o RCC.   [name], MD [facility] [email or web address] 10/11/2023, 10:51 AM

## 2023-10-11 NOTE — Significant Event (Addendum)
RRT for hypoxia, found to have sats in 70's. Placed on NRB with recovery to 100%. She is awake and alert. Had been on 2L O2 with appropriate sats previous to event. Exam with increased rales going up 1/2 on the left, previously in the base.   A/P  Hypoxic resp failure,  CAP  - CXR  - Continuous pulse ox  - Will increase from 2L, may have sleep apnea contributing to desat overnight  - Consider CPAP if recurrent although aspiration is a risk with her   Update:  CXR unchanged, stable on 4-5L O2.  Called with critical + GNR in 1/2 Bcx sets. Will change CTX to Zosyn until speciation. Source unclear, possibly has a GNR pna although not typical. Will obtain CT A/P w IV to eval for occult intraabd source of infection   Update:  Changed lasix to IV in case she is slightly volume up, ordered daily for now   Dolly Rias, MD  [facility]

## 2023-10-11 NOTE — Progress Notes (Addendum)
Pt started to yell out for help. Upon entry, pt reported feeling very hot. Vitals checked and SP02 was 70% on on 2 L, and pt was a bit tachy at 105. Non-rebreather placed and SP02 up to 100%. RT placed pt on 4 L and 02 97%. HR now WNL. Rapid response was called, pt placed on continuous pulse ox and chest X-ray was obtained.

## 2023-10-11 NOTE — Progress Notes (Signed)
Pt placed on Nasal CPAP, pressure adjusted for pt comfort and set to 6cm H2O with 3L bleed in. Pt tolerating well at this time

## 2023-10-11 NOTE — Plan of Care (Signed)
  Problem: Clinical Measurements: Goal: Respiratory complications will improve Outcome: Progressing   Problem: Nutrition: Goal: Adequate nutrition will be maintained Outcome: Progressing   Problem: Pain Managment: Goal: General experience of comfort will improve and/or be controlled Outcome: Progressing   Problem: Safety: Goal: Ability to remain free from injury will improve Outcome: Progressing   Problem: Skin Integrity: Goal: Risk for impaired skin integrity will decrease Outcome: Progressing

## 2023-10-11 NOTE — Progress Notes (Signed)
Offered pt atorvastatin per MD order. She states that her MD told her no need to take any medicine for high cholesterol since her good cholestrol was "so good".  She refused dose.

## 2023-10-12 DIAGNOSIS — R2990 Unspecified symptoms and signs involving the nervous system: Secondary | ICD-10-CM | POA: Diagnosis not present

## 2023-10-12 LAB — RESPIRATORY PANEL BY PCR

## 2023-10-12 NOTE — Progress Notes (Signed)
Pt arrived to room 326 via WC from ICU. Pt able to stand and step to bed with standby assist. Pt dyspneic with exertion and cannot lie down flat in bed, requiring HOB up >30 degrees. VSS. O2 @ 3lpm [place] on. Telemetry applied per order. Pt and family oriented to room and safety precautions. Bed alarm on for safety, call bell within reach. Family members remain at bedside.

## 2023-10-12 NOTE — Plan of Care (Signed)
  Problem: Education: Goal: Knowledge of General Education information will improve Description Including pain rating scale, medication(s)/side effects and non-pharmacologic comfort measures Outcome: Progressing   Problem: Nutrition: Goal: Adequate nutrition will be maintained Outcome: Progressing   Problem: Coping: Goal: Level of anxiety will decrease Outcome: Progressing   Problem: Elimination: Goal: Will not experience complications related to urinary retention Outcome: Progressing   Problem: Safety: Goal: Ability to remain free from injury will improve Outcome: Progressing   

## 2023-10-12 NOTE — Progress Notes (Signed)
Patient moved from ICU to 326 around 1125 am. At this time her CPAP machine was left in ICU and was stripped and cleaned by housekeeping. Also dulera inhaler was left in ICU. Inhaler was searched for by this RT at 1930 pm . It was not found. New inhaler obtained from [facility]). Also new set up for CPAP obtained and placed in room. No nasal prongs is available only nasal mask and  not sure if patient will wear.

## 2023-10-12 NOTE — Progress Notes (Signed)
[facility] PROGRESS NOTE  Danielle Harmon (DOB: 05/10/1941) QIH:[id number] PCP: Bennie Pierini, FNP  Brief Narrative: Danielle Harmon is an 83 y.o. female with a history of 6L O2-dependent COPD, HTN, and GERD who presented to the ED on 10/10/2023 with right arm tingling/numbness with concern for stroke. She also reported increasing leg swelling. Vital signs were stable, requiring 4L O2 (whereas baseline reported to be 6L) with the exception of hypothermia (rectal temperature of 91.61F) without other evidence of sepsis. Blood work was essentially Doctor, general practice. CXR demonstrated a possible retrocardiac opacity (infiltrate vs. atelectasis), UA negative. EDP requested admission for hypothermia. Blood cultures ultimately grew Pseudomonas for which ceftriaxone, started at admission, was changed to zosyn.   CT head had shown no acute abnormality and subsequent MRI brain was ordered, has resulted without evidence of stroke. Neurology was consulted, work up completed and cardiac monitoring recommended after discharge. She had an episode of desaturations 1/18 prompting transfer to SDU though has stabilized durably from this. Her breathing status is improved, neurological symptoms subsided.   Subjective: Feeling fine, sitting in chair eating breakfast. Breathing about the same. No new weakness or numbness. No fevers or chills.   Objective: BP 123/62   Pulse 91   Temp (!) 97.3 F (36.3 C) (Axillary)   Resp (!) 22   Ht 5\' 6"  (1.676 m)   Wt 65.6 kg   SpO2 (!) 86%   BMI 23.34 kg/m   Gen: No distress Pulm: Clear, nonlabored  CV: RRR, no MRG, LE edema stable GI: Soft, NT, ND, +BS  Neuro: Alert and oriented. No new focal deficits. Ext: Warm, no deformities. Skin: No rashes, lesions or ulcers on visualized skin   Assessment & Plan: Right sided numbness, TIA: CT nonacute, MRI nonacute with chronic microvascular changes. MRA without LVO/significant stenosis, carotid U/S with < 49%  stenosis. Echo w/bubble study is negative for IAS/CES.  - PT/OT/SLP > will pursue SNF rehab.  - Neurology consulted, recommending DAPT x3 weeks, then aspirin monotherapy, neurology follow up after discharge.  - With negative work up as above, cardiac monitoring after discharge is recommended. Will remain on telemetry while here.  - LDL is 78 (though HDL is 115!), started atorvastatin 40mg  though patient is declining this. - HbA1c is in process, typically may be several days.   LLL CAP/bronchiectasis: Unable to rule out pneumonia, ?etiology of Pseudomonas bacteremia. Full viral panel remains pending.  - Zosyn with duration as defined below, azithromycin (planned 5 days)   Pseudomonas bacteremia: CT abd/pelvis without nidus for infection, UA was negative. Odd presentation with hypothermia but no other evidence of sepsis (normal WBC, hemodynamics), and the fact that hypothermia resolved with antibiotics that would not be active against this pathogen.  - Changed CTX to zosyn, will continue this still and tailor to susceptibility data (remains pending). Will plan to d/w ID once susceptibility data return.   Acute on chronic hypoxic respiratory failure, COPD:  - Continue supplemental oxygen at her baseline. Daughter reports that she often sleeps mouth breathing and had episode inpatient overnight into 1/18 where she was found doing this with SpO2 in 70%'s. She has returned to her baseline which is 6L O2 and is with normal respiratory effort and SpO2 in mid-high 90%'s.  - Continue bronchodilators, scheduled duoneb, so changed breztri to dulera for now, and prn albuterol. No exacerbation currently noted, so avoiding steroids.   Lower extremity edema: This is a chronic issue. LE U/S negative for DVT. Echo without significant overload, small caliber IVC in  fact, and NORMAL RVSF. Hypoalbuminemia likely contributing, also suspect venous insufficiency, though no dermatitis/siderosis, etc. Negative dipstick  protein on UA.  - Elevate, compression - Will hold a dose of lasix given the small IVC. Do not see pulmonary edema on CXR. - Holding PTA norvasc (for permissive HTN purposes, but also ?if contributing to edema)  GERD:  - Continue PPI  Goals of care counseling, DNR (POA):  - DNR confirmed. Pt amenable to current measures, but would not consent to intubation/mechanical ventilation.   Exophytic left renal lesion: D/w pt's daughter the need for nonemergent MRI for further characterization and to r/o RCC.   [name], MD [email or web address] 10/12/2023, 9:34 AM

## 2023-10-13 DIAGNOSIS — R2990 Unspecified symptoms and signs involving the nervous system: Secondary | ICD-10-CM | POA: Diagnosis not present

## 2023-10-13 LAB — CBC
HCT: 33.6 % — ABNORMAL LOW (ref 36.0–46.0)
Hemoglobin: 10.4 g/dL — ABNORMAL LOW (ref 12.0–15.0)
MCH: 28.6 pg (ref 26.0–34.0)
MCHC: 31 g/dL (ref 30.0–36.0)
MCV: 92.3 fL (ref 80.0–100.0)
Platelets: 161 10*3/uL (ref 150–400)
RBC: 3.64 MIL/uL — ABNORMAL LOW (ref 3.87–5.11)
RDW: 14.8 % (ref 11.5–15.5)
WBC: 6.7 10*3/uL (ref 4.0–10.5)
nRBC: 0 % (ref 0.0–0.2)

## 2023-10-13 LAB — BASIC METABOLIC PANEL
Anion gap: 7 (ref 5–15)
BUN: 19 mg/dL (ref 8–23)
CO2: 34 mmol/L — ABNORMAL HIGH (ref 22–32)
Calcium: 8.8 mg/dL — ABNORMAL LOW (ref 8.9–10.3)
Chloride: 90 mmol/L — ABNORMAL LOW (ref 98–111)
Creatinine, Ser: 1.01 mg/dL — ABNORMAL HIGH (ref 0.44–1.00)
GFR, Estimated: 56 mL/min — ABNORMAL LOW (ref >60)
Glucose, Bld: 84 mg/dL (ref 70–99)
Potassium: 3.9 mmol/L (ref 3.5–5.1)
Sodium: 131 mmol/L — ABNORMAL LOW (ref 135–145)

## 2023-10-13 LAB — CULTURE, BLOOD (ROUTINE X 2)
Culture  Setup Time: NO GROWTH
Special Requests: ADEQUATE

## 2023-10-13 MED ORDER — IPRATROPIUM-ALBUTEROL 0.5-2.5 (3) MG/3ML IN SOLN
3.0000 mL | Freq: Two times a day (BID) | RESPIRATORY_TRACT | Status: DC
  Administered 2023-10-13 – 2023-10-16 (×6): 3 mL via RESPIRATORY_TRACT
  Filled 2023-10-13 (×6): qty 3

## 2023-10-13 MED ORDER — SODIUM CHLORIDE 0.9 % IV SOLN
2.0000 g | Freq: Two times a day (BID) | INTRAVENOUS | Status: DC
  Administered 2023-10-13 (×2): 2 g via INTRAVENOUS
  Filled 2023-10-13 (×2): qty 12.5

## 2023-10-13 MED ORDER — IPRATROPIUM-ALBUTEROL 0.5-2.5 (3) MG/3ML IN SOLN
3.0000 mL | Freq: Four times a day (QID) | RESPIRATORY_TRACT | Status: DC
  Administered 2023-10-13: 3 mL via RESPIRATORY_TRACT
  Filled 2023-10-13: qty 3

## 2023-10-13 NOTE — Progress Notes (Signed)
[facility] PROGRESS NOTE  Danielle Harmon (DOB: [birth year]) JYN:[id number] PCP: Bennie Pierini, FNP  Brief Narrative: Danielle Harmon is an 83 y.o. female with a history of 6L O2-dependent COPD, HTN, and GERD who presented to the ED on 10/10/2023 with right arm tingling/numbness with concern for stroke. She also reported increasing leg swelling. Vital signs were stable, requiring 4L O2 (whereas baseline reported to be 6L) with the exception of hypothermia (rectal temperature of 91.52F) without other evidence of sepsis. Blood work was essentially Doctor, general practice. CXR demonstrated a possible retrocardiac opacity (infiltrate vs. atelectasis), UA negative. EDP requested admission for hypothermia. Blood cultures ultimately grew Pseudomonas for which ceftriaxone, started at admission, was changed to zosyn. Creatinine slightly elevated 1/20, changed abx to cefepime pending ID input.   CT head had shown no acute abnormality and subsequent MRI brain was ordered, has resulted without evidence of stroke. Neurology was consulted, work up completed and cardiac monitoring recommended after discharge. She had an episode of desaturations 1/18 prompting transfer to SDU though has stabilized durably from this. Her breathing status is improved, neurological symptoms subsided.   Subjective: No new complaints, feels she's back to her baseline without new weakness or numbness or dyspnea or fevers. No abd/chest pain. Eating ok.   Objective: BP 139/63 (BP Location: Right Arm)   Pulse 80   Temp 97.7 F (36.5 C) (Oral)   Resp 18   Ht 5\' 6"  (1.676 m)   Wt 65.6 kg   SpO2 100%   BMI 23.34 kg/m   Gen: No distress Pulm: Clear, nonlabored  CV: RRR, no MRG, stable LE edema, largely nonpitting. GI: Soft, NT, ND, +BS  Neuro: Alert and oriented. No new focal deficits. Ext: Warm, no deformities Skin: Significant UE ecchymoses and single ecchymosis on right midchest, stable. No other/new rashes, lesions or  ulcers on visualized skin   Assessment & Plan: Right sided numbness, TIA: CT nonacute, MRI nonacute with chronic microvascular changes. MRA without LVO/significant stenosis, carotid U/S with < 49% stenosis. Echo w/bubble study is negative for IAS/CES.  - PT/OT/SLP > will pursue SNF rehab. Likely DC date 1/21. - Neurology consulted, recommending DAPT x3 weeks, then aspirin monotherapy, neurology follow up after discharge.  - With negative work up as above, cardiac monitoring after discharge is recommended. Will remain on telemetry while here (Only NSR again on my review this morning).  - LDL is 78 (though HDL is 115!), started atorvastatin 40mg . Pt amenable. - HbA1c is 4.7%.  LLL CAP/bronchiectasis: Unable to rule out pneumonia, ?etiology of Pseudomonas bacteremia. Full viral panel negative.  - Abx as discussed below.  Pseudomonas bacteremia: CT abd/pelvis without nidus for infection, UA was negative. Odd presentation with hypothermia but no other evidence of sepsis (normal WBC, hemodynamics), and the fact that hypothermia resolved with antibiotics that would not be active against this pathogen.  - Changed CTX to zosyn, will change to cefepime today given otherwise unexplained AKI. Recheck BMP in AM. Later in the morning, susceptibilities returned, pan sensitive. Will d/w ID, Dr. Thedore Mins.  Acute on chronic hypoxic respiratory failure, COPD:  - Continue supplemental oxygen at her baseline. Daughter reports that she often sleeps mouth breathing and had episode inpatient overnight into 1/18 where she was found doing this with SpO2 in 70%'s. She has returned to her baseline which is 6L O2 and is with normal respiratory effort and SpO2 in mid-high 90%'s.  - Continue bronchodilators, scheduled duoneb, so changed breztri to dulera for now, and prn albuterol. No exacerbation currently noted,  so avoiding steroids. Plan to resume home meds at DC.   Lower extremity edema: This is a chronic issue. LE U/S  negative for DVT. Echo without significant overload, small caliber IVC in fact, and NORMAL RVSF. Hypoalbuminemia likely contributing, also suspect venous insufficiency, though no dermatitis/siderosis, etc. Negative dipstick protein on UA.  - Elevate, compression - Will hold a dose of lasix given the small IVC. Do not see pulmonary edema on CXR. - Holding PTA norvasc (for permissive HTN purposes, but also ?if contributing to edema). Remains normotensive.  GERD:  - Continue PPI  Goals of care counseling, DNR (POA):  - DNR confirmed. Pt amenable to current measures, but would not consent to intubation/mechanical ventilation.   Exophytic left renal lesion: D/w pt's daughter the need for nonemergent MRI for further characterization and to r/o RCC.   [name], MD [email or web address] 10/13/2023, 11:27 AM

## 2023-10-13 NOTE — TOC Progression Note (Signed)
Transition of Care [facility]) - Progression Note    Patient Details  Name: Danielle Harmon MRN: [id number] Date of Birth: 1941-05-30  [facility]) CM/SW Contact  Villa Herb, [place] Phone Number: 10/13/2023, 10:17 AM  Clinical Narrative:    CSW spoke to [place] in admissions at [facility], [facility] requested that CSW email referral over. CSW sent secure email with referral packet. CSW updated pts daughter also. TOC to follow.   Expected Discharge Plan: Skilled Nursing Facility Barriers to Discharge: Continued Medical Work up  Expected Discharge Plan and Services In-house Referral: Clinical Social Work Discharge Planning Services: CM Consult Post Acute Care Choice: Skilled Nursing Facility Living arrangements for the past 2 months: Single Family Home   Social Determinants of Health (SDOH) Interventions SDOH Screenings   Food Insecurity: No Food Insecurity (10/10/2023)  Housing: Low Risk  (10/10/2023)  Transportation Needs: No Transportation Needs (10/10/2023)  Utilities: Not At Risk (10/10/2023)  Alcohol Screen: Low Risk  (07/10/2023)  Depression (PHQ2-9): Low Risk  (08/18/2023)  Financial Resource Strain: Low Risk  (08/17/2023)  Physical Activity: Unknown (08/17/2023)  Recent Concern: Physical Activity - Inactive (07/10/2023)  Social Connections: Unknown (10/10/2023)  Stress: No Stress Concern Present (08/17/2023)  Tobacco Use: Medium Risk (10/10/2023)  Health Literacy: Adequate Health Literacy (07/10/2023)    Readmission Risk Interventions    10/10/2023   12:17 PM  Readmission Risk Prevention Plan  Medication Screening Complete  Transportation Screening Complete

## 2023-10-13 NOTE — Progress Notes (Signed)
Patient is off BiPAP back on 4 liters HFNC

## 2023-10-13 NOTE — Progress Notes (Signed)
Pharmacy Antibiotic Note  Danielle Harmon is a 83 y.o. female admitted on 10/10/2023 with bacteremia.  Pharmacy has been consulted for Cefepime dosing. Pseudomonas bacteremia. AKI . F/U sensitivities. LLL/CAP/bronchiectasis.   Plan: Cefepime 2gm IV q12h F/U cxs and clinical progress Monitor V/S, labs  Height: 5\' 6"  (167.6 cm) Weight: 65.6 kg (144 lb 10 oz) IBW/kg (Calculated) : 59.3  Temp (24hrs), Avg:97.7 F (36.5 C), Min:97.3 F (36.3 C), Max:98.2 F (36.8 C)  Recent Labs  Lab 10/10/23 0326 10/10/23 0532 10/11/23 0642 10/13/23 0532  WBC 4.1  --  4.9 6.7  CREATININE 0.69  --  0.88 1.01*  LATICACIDVEN 0.5 0.4*  --   --     Estimated Creatinine Clearance: 40.2 mL/min (A) (by C-G formula based on SCr of 1.01 mg/dL (H)).    Allergies  Allergen Reactions   Bee Venom Anaphylaxis    Antimicrobials this admission: Cefepime 1/20>> Zosyn 1/17> 1/20 Azithromycin 1/17> 1/20 1/17 Ceftriaxone x 1 dose   Microbiology results: 1/17 BCX: BCID : Pseudomonas , sensitivities pending 1/18 MRSA PCR neg 1/17 Viral panel: none detected   Thank you for allowing pharmacy to be a part of this patient's care.  Elder Cyphers, BS Pharm D, BCPS Clinical Pharmacist 10/13/2023 7:53 AM

## 2023-10-13 NOTE — Progress Notes (Signed)
Mobility Specialist Progress Note:    10/13/23 1350  Mobility  Activity Transferred from bed to chair  Level of Assistance Minimal assist, patient does 75% or more  Assistive Device Front wheel walker  Distance Ambulated (ft) 3 ft  Range of Motion/Exercises Active;All extremities  Activity Response Tolerated well  Mobility Referral Yes  Mobility visit 1 Mobility  Mobility Specialist Start Time (ACUTE ONLY) 1330  Mobility Specialist Stop Time (ACUTE ONLY) 1350  Mobility Specialist Time Calculation (min) (ACUTE ONLY) 20 min   Pt received in bed, agreeable to mobility. Required MinA to stand and transfer to chair with RW. Tolerated well, asx throughout. Alarm on, call bell in reach. All needs met.  Merelin Human Mobility Specialist Please contact via Special educational needs teacher or  Rehab office at [phone]

## 2023-10-13 NOTE — Progress Notes (Signed)
Did flutter with patient X10 with fair effort.  Patient was sitting up on side of bed.

## 2023-10-14 ENCOUNTER — Inpatient Hospital Stay (HOSPITAL_COMMUNITY): Payer: Medicare Other

## 2023-10-14 DIAGNOSIS — R2990 Unspecified symptoms and signs involving the nervous system: Secondary | ICD-10-CM | POA: Diagnosis not present

## 2023-10-14 LAB — BASIC METABOLIC PANEL
Anion gap: 8 (ref 5–15)
BUN: 22 mg/dL (ref 8–23)
CO2: 33 mmol/L — ABNORMAL HIGH (ref 22–32)
Calcium: 9 mg/dL (ref 8.9–10.3)
Chloride: 94 mmol/L — ABNORMAL LOW (ref 98–111)
Creatinine, Ser: 0.82 mg/dL (ref 0.44–1.00)
GFR, Estimated: >60 mL/min (ref >60)
Glucose, Bld: 84 mg/dL (ref 70–99)
Potassium: 3.9 mmol/L (ref 3.5–5.1)
Sodium: 135 mmol/L (ref 135–145)

## 2023-10-14 LAB — SARS CORONAVIRUS 2 BY RT PCR: SARS Coronavirus 2 by RT PCR: NEGATIVE

## 2023-10-14 MED ORDER — CIPROFLOXACIN HCL 250 MG PO TABS
750.0000 mg | ORAL_TABLET | Freq: Two times a day (BID) | ORAL | Status: DC
Start: 2023-10-15 — End: 2023-10-16
  Administered 2023-10-15 – 2023-10-16 (×3): 750 mg via ORAL
  Filled 2023-10-14 (×3): qty 3

## 2023-10-14 MED ORDER — SODIUM CHLORIDE 0.9 % IV SOLN
2.0000 g | Freq: Two times a day (BID) | INTRAVENOUS | Status: AC
  Administered 2023-10-14: 2 g via INTRAVENOUS
  Filled 2023-10-14: qty 12.5

## 2023-10-14 NOTE — TOC Progression Note (Signed)
Transition of Care [facility]) - Progression Note    Patient Details  Name: Danielle Harmon MRN: [id number] Date of Birth: [birth year]  Transition of Care [facility]) CM/SW Contact  Villa Herb, [place] Phone Number: 10/14/2023, 2:12 PM  Clinical Narrative:    CSW spoke with pts daughter who confirms they accept bed at [facility]. CSW spoke to [place] in admissions who requested COVID test be completed, MD ordered and it has resulted negative. CSW spoke with pts daughter about [facility] vs EMS. Pts daughter requesting EMS transport for pt. CSW updated that we will try to get this set up however, pt will need to arrive to facility by 4 and there is only one convalescent EMS truck tomorrow. Pt will need to be on EMS list early tomorrow morning. CSW to work with Alvino Chapel at [facility] to make sure everything is in place for pts discharge. TOC to follow.   Expected Discharge Plan: Skilled Nursing Facility Barriers to Discharge: Continued Medical Work up  Expected Discharge Plan and Services In-house Referral: Clinical Social Work Discharge Planning Services: CM Consult Post Acute Care Choice: Skilled Nursing Facility Living arrangements for the past 2 months: Single Family Home                                       Social Determinants of Health (SDOH) Interventions SDOH Screenings   Food Insecurity: No Food Insecurity (10/10/2023)  Housing: Low Risk  (10/10/2023)  Transportation Needs: No Transportation Needs (10/10/2023)  Utilities: Not At Risk (10/10/2023)  Alcohol Screen: Low Risk  (07/10/2023)  Depression (PHQ2-9): Low Risk  (08/18/2023)  Financial Resource Strain: Low Risk  (08/17/2023)  Physical Activity: Unknown (08/17/2023)  Recent Concern: Physical Activity - Inactive (07/10/2023)  Social Connections: Unknown (10/10/2023)  Stress: No Stress Concern Present (08/17/2023)  Tobacco Use: Medium Risk (10/10/2023)  Health Literacy: Adequate Health Literacy (07/10/2023)    Readmission Risk  Interventions    10/10/2023   12:17 PM  Readmission Risk Prevention Plan  Medication Screening Complete  Transportation Screening Complete

## 2023-10-14 NOTE — Plan of Care (Signed)

## 2023-10-14 NOTE — Progress Notes (Signed)
[facility] PROGRESS NOTE  Danielle Harmon (DOB: October 02, 1940) GNF:[id number] PCP: Bennie Pierini, FNP  Brief Narrative: Danielle Harmon is an 83 y.o. female with a history of 6L O2-dependent COPD, HTN, and GERD who presented to the ED on 10/10/2023 with right arm tingling/numbness with concern for stroke. She also reported increasing leg swelling. Vital signs were stable, requiring 4L O2 (whereas baseline reported to be 6L) with the exception of hypothermia (rectal temperature of 91.54F) without other evidence of sepsis. Blood work was essentially Doctor, general practice. CXR demonstrated a possible retrocardiac opacity (infiltrate vs. atelectasis), UA negative. EDP requested admission for hypothermia. Blood cultures ultimately grew Pseudomonas for which ceftriaxone, started at admission, was changed to zosyn. Creatinine slightly elevated 1/20, changed abx to cefepime pending ID input.   CT head had shown no acute abnormality and subsequent MRI brain was ordered, has resulted without evidence of stroke. Neurology was consulted, work up completed and cardiac monitoring recommended after discharge. She had an episode of desaturations 1/18 prompting transfer to SDU though has stabilized durably from this. Her breathing status is improved, neurological symptoms subsided.   Subjective: Eating breakfast, denies shortness of breath.   Objective: BP (!) 144/66 (BP Location: Left Arm)   Pulse 76   Temp 97.7 F (36.5 C) (Oral)   Resp 16   Ht 5\' 6"  (1.676 m)   Wt 65.6 kg   SpO2 100%   BMI 23.34 kg/m   Gen: No distress elderly female  Pulm: Clear bilaterally, no wheezes or crackles, nonlabored  CV: RRR, no MRG, stable LE edema, nontender GI: Soft, NT, ND, +BS  Neuro: Alert and oriented. No new focal deficits. Note pupils are constricted but reactive, +arcus senilis with grossly appearing with clear anterior chamber. Funduscopic exam attempted with limited visualization.  Ext: Warm, no  deformities Skin: No new rashes, lesions or ulcers on visualized skin   Assessment & Plan: Right sided numbness, TIA: CT nonacute, MRI nonacute with chronic microvascular changes. MRA without LVO/significant stenosis, carotid U/S with < 49% stenosis. Echo w/bubble study is negative for IAS/CES.  - PT/OT/SLP > will pursue SNF rehab. Likely DC date 1/22. Facility requested covid test (ordered and negative) - Neurology consulted, recommending DAPT x3 weeks, then aspirin monotherapy, neurology follow up after discharge.  - With negative work up as above, cardiac monitoring after discharge is recommended. Given that she will be in [place], [place], will allow SNF MD to place referral. - LDL is 78 (though HDL is 115!), started atorvastatin 40mg . Pt amenable. - HbA1c is 4.7%.  LLL CAP/bronchiectasis: Unable to rule out pneumonia, ?etiology of Pseudomonas bacteremia. Full viral panel negative.  - Abx as discussed below.  Pseudomonas bacteremia: CT abd/pelvis without nidus for infection, UA was negative. Odd presentation with hypothermia but no other evidence of sepsis (normal WBC, hemodynamics), and the fact that hypothermia resolved with antibiotics that would not be active against this pathogen.  - Changed CTX to zosyn and per discussion with ID, ok to transition to po and complete 7 total days of adequate therapy (1/17 - 1/24 AM final dose) with cipro.   Acute on chronic hypoxic respiratory failure, COPD:  - Continue supplemental oxygen at her baseline. Daughter reports that she often sleeps mouth breathing and had episode inpatient overnight into 1/18 where she was found doing this with SpO2 in 70%'s. She has returned to her baseline which is 6L O2 and is with normal respiratory effort and SpO2 in mid-high 90%'s. Now actually only requiring 4L O2. Could consider BiPAP  qHS with bleed in O2.  - Continue bronchodilators, scheduled duoneb, so changed breztri to dulera for now, and prn albuterol. No exacerbation  currently noted, so avoiding steroids. Plan to resume home meds at DC.   Lower extremity edema: This is a chronic issue. LE U/S negative for DVT. Echo without significant overload, small caliber IVC in fact, and NORMAL RVSF. Hypoalbuminemia likely contributing, also suspect venous insufficiency, though no dermatitis/siderosis, etc. Negative dipstick protein on UA.  - Elevate, compression - Ok to continue lasix after discharge.  - Restart home norvasc as BP is rising.  GERD:  - Continue PPI  Goals of care counseling, DNR (POA):  - DNR confirmed. Pt amenable to current measures, but would not consent to intubation/mechanical ventilation.   Exophytic left renal lesion: D/w pt's daughter the need for nonemergent MRI for further characterization and to r/o RCC.   Decreased visual acuity: No central neurological etiology identified, no APD. Recommend outpatient ophthalmology follow up. Discussed w/ pt, family.   [name], MD [facility] [email or web address] 10/14/2023, 2:11 PM

## 2023-10-14 NOTE — Progress Notes (Signed)
Alert and oriented x 2 but confused about day and location. Voided this morning in [facility] and was bladder scanned this afternoon due to being dry and no out put in canister. Scan showed 380 .  Assisted to bedside commode and voided and had bm.  Assisted to chair with help of students.  Bed alarm set and call bell in lap

## 2023-10-14 NOTE — Progress Notes (Signed)
While discussing plan of care with her daughter, she raised the concern for speech changes on the phone earlier with the patient. I went up to check on her, found her sleeping soundly in the chair. Nasal cannula was dislodged and she was mostly mouth breathing. She roused and we replaced the nasal cannula, she had no specific complaints. After a short discussion I did think her speech was a bit harder to understand than prior, though there were no focal deficits on all tested cranial and peripheral nerves. She also took a little bit of convincing that her oxygen tank was not in the room, that she wasn't at home but in fact in the hospital. She was redirectable and does have intact recall.   CT head ordered stat, confirms stable chronic microvascular disease without acute change.   I relayed these findings including reassuring CT to her daughter. I suspect she's having an element of delirium contributing to her symptoms, which we will treat supportively.   Hazeline Junker, MD 10/14/2023 4:53 PM

## 2023-10-15 DIAGNOSIS — R7881 Bacteremia: Secondary | ICD-10-CM | POA: Diagnosis not present

## 2023-10-15 DIAGNOSIS — R2990 Unspecified symptoms and signs involving the nervous system: Secondary | ICD-10-CM | POA: Diagnosis not present

## 2023-10-15 LAB — CULTURE, BLOOD (ROUTINE X 2)
Culture: NO GROWTH
Special Requests: ADEQUATE

## 2023-10-15 MED ORDER — ATORVASTATIN CALCIUM 40 MG PO TABS: 40.0000 mg | ORAL_TABLET | Freq: Every day | ORAL | Status: DC

## 2023-10-15 MED ORDER — ASPIRIN 81 MG PO CHEW: 81.0000 mg | CHEWABLE_TABLET | Freq: Every day | ORAL | Status: DC

## 2023-10-15 MED ORDER — CLOPIDOGREL BISULFATE 75 MG PO TABS: 75.0000 mg | ORAL_TABLET | Freq: Every day | ORAL | Status: DC

## 2023-10-15 MED ORDER — CIPROFLOXACIN HCL 750 MG PO TABS: 750.0000 mg | ORAL_TABLET | Freq: Two times a day (BID) | ORAL | Status: DC

## 2023-10-15 NOTE — Progress Notes (Signed)
Mobility Specialist Progress Note:    10/15/23 1215  Mobility  Activity Transferred from bed to chair;Stood at bedside  Level of Assistance Minimal assist, patient does 75% or more  Assistive Device Front wheel walker  Distance Ambulated (ft) 5 ft  Range of Motion/Exercises Active;All extremities  Activity Response Tolerated well  Mobility Referral Yes  Mobility visit 1 Mobility  Mobility Specialist Start Time (ACUTE ONLY) 1155  Mobility Specialist Stop Time (ACUTE ONLY) 1215  Mobility Specialist Time Calculation (min) (ACUTE ONLY) 20 min   Pt received in bed, agreeable to mobility. Required MinA to stand and transfer with RW. Tolerated well, asx throughout. Left pt in chair, alarm on. Call bell in hand, all needs met.   Lawerance Bach Mobility Specialist Please contact via Special educational needs teacher or  Rehab office at [phone]

## 2023-10-15 NOTE — TOC Progression Note (Signed)
Transition of Care [facility]) - Progression Note    Patient Details  Name: Danielle Harmon MRN: [id number] Date of Birth: 11/17/1940  Transition [facility]) CM/SW Contact  Villa Herb, [place] Phone Number: 10/15/2023, 10:52 AM  Clinical Narrative:    CSW spoke to EMS transport scheduler who states they do not have a truck to transport pt to [facility] today. CSW has placed pt on the EMS list for tomorrow morning. CSW to send D/C clinicals from today to SNF. TOC to follow.   Expected Discharge Plan: Skilled Nursing Facility Barriers to Discharge: Transportation  Expected Discharge Plan and Services In-house Referral: Clinical Social Work Discharge Planning Services: CM Consult Post Acute Care Choice: Skilled Nursing Facility Living arrangements for the past 2 months: Single Family Home Expected Discharge Date: 10/15/23                                     Social Determinants of Health (SDOH) Interventions SDOH Screenings   Food Insecurity: No Food Insecurity (10/10/2023)  Housing: Low Risk  (10/10/2023)  Transportation Needs: No Transportation Needs (10/10/2023)  Utilities: Not At Risk (10/10/2023)  Alcohol Screen: Low Risk  (07/10/2023)  Depression (PHQ2-9): Low Risk  (08/18/2023)  Financial Resource Strain: Low Risk  (08/17/2023)  Physical Activity: Unknown (08/17/2023)  Recent Concern: Physical Activity - Inactive (07/10/2023)  Social Connections: Unknown (10/10/2023)  Stress: No Stress Concern Present (08/17/2023)  Tobacco Use: Medium Risk (10/10/2023)  Health Literacy: Adequate Health Literacy (07/10/2023)    Readmission Risk Interventions    10/10/2023   12:17 PM  Readmission Risk Prevention Plan  Medication Screening Complete  Transportation Screening Complete

## 2023-10-15 NOTE — Progress Notes (Signed)
Physical Therapy Note  Patient Details  Name: Danielle Harmon MRN: [id number] Date of Birth: 08/06/1941 Today's Date: 10/15/2023    Nursing with pt upon entrance.  Therapist nor nurse could get pt to arouse but able to after a couple minutes.  Assisted nurse with scooting pt up in bed, total assist X2.  Pt refused further participation with therapy today stating she was too tired and requested return.  Therapist returned later in morning and pt refused participation again.    Bascom Levels, Antoinette Borgwardt B 10/15/2023, 1:22 PM

## 2023-10-15 NOTE — Discharge Summary (Signed)
Physician Discharge Summary   Patient: Danielle Harmon MRN: [id number] DOB: 10/15/1940  Admit date:     10/10/2023  Discharge date: 10/15/23  Discharge Physician: Onalee Hua Sabena Winner   PCP: Bennie Pierini, FNP   Recommendations at discharge:   Please follow up with primary care provider within 1-2 weeks  Please repeat BMP and CBC in one week  Please arrange 30 day heart monitor as recommended by neurology as part of her TIA work up     Hospital Course: Danielle Harmon is an 83 y.o. female with a history of 6L O2-dependent COPD, HTN, and GERD who presented to the ED on 10/10/2023 with right arm tingling/numbness with concern for stroke. She also reported increasing leg swelling. Vital signs were stable, requiring 4L O2 (whereas baseline reported to be 6L) with the exception of hypothermia (rectal temperature of 91.52F) without other evidence of sepsis. Blood work was essentially Doctor, general practice. CXR demonstrated a possible retrocardiac opacity (infiltrate vs. atelectasis), UA negative. EDP requested admission for hypothermia. Blood cultures ultimately grew Pseudomonas for which ceftriaxone, started at admission, was changed to zosyn. Creatinine slightly elevated 1/20, changed abx to cefepime pending ID input.    CT head had shown no acute abnormality and subsequent MRI brain was ordered, has resulted without evidence of stroke. Neurology was consulted, work up completed and cardiac monitoring recommended after discharge. She had an episode of desaturations 1/18 prompting transfer to SDU though has stabilized durably from this. Her breathing status is improved, neurological symptoms subsided.  The patient did have some garbled speech in the afternoon of 10/14/23.  Repeat CT of the brain was negative.  The patient does not have any new focal neurologic deficits.  This was felt to be due to hospital delirium. On the morning of discharge, the patient was at her usual baseline as described on 10/14/2023.   Her speech was understandable and clear.  She did not have any further focal neurologic deficits on examination prior to discharge.  Assessment and Plan: Right sided numbness, TIA: CT nonacute, MRI nonacute with chronic microvascular changes. MRA without LVO/significant stenosis, carotid U/S with < 49% stenosis. Echo w/bubble study is negative for IAS/CES.  - PT/OT/SLP > will pursue SNF rehab. Likely DC date 1/22. Facility requested covid test (ordered and negative) - Neurology consulted, recommending DAPT x3 weeks, then aspirin monotherapy, neurology follow up after discharge.  - With negative work up as above, cardiac monitoring after discharge is recommended. Given that she will be in [place], [place], will allow SNF MD to place referral. - LDL is 78 (though HDL is 115!), started atorvastatin 40mg . Pt amenable. - HbA1c is 4.7%. -Continue Plavix with aspirin for 16 more days after discharge, then aspirin 81 mg monotherapy thereafter   LLL CAP/bronchiectasis: Unable to rule out pneumonia, ?etiology of Pseudomonas bacteremia. Full viral panel negative.  - Abx as discussed below.   Pseudomonas bacteremia: CT abd/pelvis without nidus for infection, UA was negative. Odd presentation with hypothermia but no other evidence of sepsis (normal WBC, hemodynamics), and the fact that hypothermia resolved with antibiotics that would not be active against this pathogen.  - Changed CTX to zosyn and per discussion with ID, ok to transition to po and complete 7 total days of adequate therapy (1/17 - 1/24 final dose) with cipro.    Acute on chronic hypoxic respiratory failure, COPD:  - Continue supplemental oxygen at her baseline. Daughter reports that she often sleeps mouth breathing and had episode inpatient overnight into 1/18 where she was found doing  this with SpO2 in 70%'s. She has returned to her baseline which is 6L O2 and is with normal respiratory effort and SpO2 in mid-high 90%'s. Now actually only requiring 4L  O2. Could consider BiPAP qHS with bleed in O2.  - Continue bronchodilators, scheduled duoneb, so changed breztri to dulera for now, and prn albuterol. No exacerbation currently noted, so avoiding steroids. Plan to resume home meds at DC.  -Currently stable on 4 L at the time of discharge   Lower extremity edema: This is a chronic issue. LE U/S negative for DVT. Echo without significant overload, small caliber IVC in fact, and NORMAL RVSF. Hypoalbuminemia likely contributing, also suspect venous insufficiency, though no dermatitis/siderosis, etc. Negative dipstick protein on UA.  - Elevate, compression - Ok to continue lasix after discharge.  - Restart home norvasc as BP is rising.   GERD:  - Continue PPI   Goals of care counseling, DNR (POA):  - DNR confirmed. Pt amenable to current measures, but would not consent to intubation/mechanical ventilation.    Exophytic left renal lesion: D/w pt's daughter the need for nonemergent MRI for further characterization and to r/o RCC.    Decreased visual acuity: No central neurological etiology identified, no APD. Recommend outpatient ophthalmology follow up. Discussed w/ pt, family.      Consultants: neurology Procedures performed: none  Disposition: Skilled nursing facility Diet recommendation:  Cardiac diet DISCHARGE MEDICATION: Allergies as of 10/15/2023       Reactions   Bee Venom Anaphylaxis        Medication List     TAKE these medications    albuterol (2.5 MG/3ML) 0.083% nebulizer solution Commonly known as: PROVENTIL Take 3 mLs (2.5 mg total) by nebulization every 6 (six) hours as needed for wheezing or shortness of breath. Pt may use every 4 to 6 hours as needed What changed: Another medication with the same name was changed. Make sure you understand how and when to take each.   albuterol 108 (90 Base) MCG/ACT inhaler Commonly known as: VENTOLIN HFA TAKE 2 PUFFS BY MOUTH EVERY 6 HOURS AS NEEDED FOR WHEEZE OR SHORTNESS OF  BREATH What changed: See the new instructions.   amLODipine 5 MG tablet Commonly known as: NORVASC TAKE 1 TABLET (5 MG TOTAL) BY MOUTH DAILY.   aspirin 81 MG chewable tablet Chew 1 tablet (81 mg total) by mouth daily. Start taking on: October 16, 2023   atorvastatin 40 MG tablet Commonly known as: LIPITOR Take 1 tablet (40 mg total) by mouth daily. Start taking on: October 16, 2023   Breztri Aerosphere 160-9-4.8 MCG/ACT Aero Generic drug: Budeson-Glycopyrrol-Formoterol TAKE 2 PUFFS BY MOUTH TWICE A DAY   ciprofloxacin 750 MG tablet Commonly known as: CIPRO Take 1 tablet (750 mg total) by mouth 2 (two) times daily. Last day cipro on 10/17/23 (stop after evening dose)   clopidogrel 75 MG tablet Commonly known as: PLAVIX Take 1 tablet (75 mg total) by mouth daily. X 16 more days, then discontinue Start taking on: October 16, 2023   furosemide 20 MG tablet Commonly known as: LASIX TAKE 2 TABLETS (40 MG TOTAL) BY MOUTH DAILY.   guaiFENesin 600 MG 12 hr tablet Commonly known as: MUCINEX Take 600 mg by mouth daily.   omeprazole 20 MG capsule Commonly known as: PRILOSEC Take 1 capsule (20 mg total) by mouth daily.   Spacer/Aero Chamber [employer] Use as directed   triamcinolone 55 MCG/ACT Aero nasal inhaler Commonly known as: NASACORT Place 2 sprays into  the nose daily. What changed:  when to take this reasons to take this        Discharge Exam: Filed Weights   10/10/23 0312 10/11/23 0525 10/12/23 0356  Weight: 71.2 kg 71.8 kg 65.6 kg   HEENT:  [place]/AT, No thrush, no icterus CV:  RRR, no rub, no S3, no S4 Lung:  bibasilar rales.  No wheeze Abd:  soft/+BS, NT Ext:  trace LE edema, no lymphangitis, no synovitis, no rash Neuro:  CN II-XII intact, strength 4/5 in RUE, RLE, strength 4/5 LUE, LLE; sensation intact bilateral; no dysmetria; babinski equivocal    Condition at discharge: stable  The results of significant diagnostics from this hospitalization  (including imaging, microbiology, ancillary and laboratory) are listed below for reference.   Imaging Studies: CT HEAD WO CONTRAST ( ) Result Date: 10/14/2023 CLINICAL DATA:  Transient ischemic attack. Neurological deficit, acute, stroke suspected. EXAM: CT HEAD WITHOUT CONTRAST TECHNIQUE: Contiguous axial images were obtained from the base of the skull through the vertex without intravenous contrast. RADIATION DOSE REDUCTION: This exam was performed according to the departmental dose-optimization program which includes automated exposure control, adjustment of the mA and/or kV according to patient size and/or use of iterative reconstruction technique. COMPARISON:  MRI 10/10/2023 FINDINGS: Brain: No acute CT finding. Chronic small-vessel ischemic changes affect the pons and cerebral hemispheric white matter. No sign of acute stroke, mass, hemorrhage, hydrocephalus or extra-axial collection. Vascular: There is atherosclerotic calcification of the major vessels at the base of the brain. Skull: Negative Sinuses/Orbits: Small amount of fluid in the sphenoid sinus. Other sinuses are clear. Orbits negative. Other: None IMPRESSION: 1. No acute CT finding. Chronic small-vessel ischemic changes of the pons and cerebral hemispheric white matter. 2. Small amount of fluid in the sphenoid sinus. Electronically Signed   By: Paulina Fusi M.D.   On: 10/14/2023 16:43   US Carotid Bilateral Result Date: 10/11/2023 CLINICAL DATA:  Stroke EXAM: BILATERAL CAROTID DUPLEX ULTRASOUND TECHNIQUE: Wallace Cullens scale imaging, color Doppler and duplex ultrasound were performed of bilateral carotid and vertebral arteries in the neck. COMPARISON:  None Available. FINDINGS: Criteria: Quantification of carotid stenosis is based on velocity parameters that correlate the residual internal carotid diameter with NASCET-based stenosis levels, using the diameter of the distal internal carotid lumen as the denominator for stenosis measurement. The  following velocity measurements were obtained: RIGHT ICA: 80/16 cm/sec CCA: 76/13 cm/sec SYSTOLIC ICA/CCA RATIO:  1.0 ECA:  122 cm/sec LEFT ICA: 94/17 cm/sec CCA: 90/11 cm/sec SYSTOLIC ICA/CCA RATIO:  1.0 ECA:  108 cm/sec RIGHT CAROTID ARTERY: Heterogeneous atherosclerotic plaque in the proximal internal carotid artery. By peak systolic velocity criteria, the estimated stenosis is less than 50%. RIGHT VERTEBRAL ARTERY:  Patent with normal antegrade flow. LEFT CAROTID ARTERY: Focal heterogeneous atherosclerotic plaque in the proximal internal carotid artery. By peak systolic velocity criteria, the estimated stenosis is less than 50%. LEFT VERTEBRAL ARTERY:  Patent with normal antegrade flow. IMPRESSION: 1. Mild (1-49%) stenosis proximal right internal carotid artery secondary to heterogenous atherosclerotic plaque. 2. Mild (1-49%) stenosis proximal left internal carotid artery secondary to heterogenous atherosclerotic plaque. 3. Vertebral arteries are patent with normal antegrade flow. Electronically Signed   By: Malachy Moan M.D.   On: 10/11/2023 07:12   CT ABDOMEN PELVIS W CONTRAST Result Date: 10/11/2023 CLINICAL DATA:  83 year old female with history of gram-negative rod bacteremia. Evaluate for intra-abdominal source of infection. EXAM: CT ABDOMEN AND PELVIS WITH CONTRAST TECHNIQUE: Multidetector CT imaging of the abdomen and pelvis was performed using  the standard protocol following bolus administration of intravenous contrast. RADIATION DOSE REDUCTION: This exam was performed according to the departmental dose-optimization program which includes automated exposure control, adjustment of the mA and/or kV according to patient size and/or use of iterative reconstruction technique. CONTRAST:  OMNIPAQUE IOHEXOL 300 MG/ML  SOLN COMPARISON:  None Available. FINDINGS: Lower chest: Scattered areas of bronchiectasis and scarring are noted in the lung bases bilaterally. Atherosclerotic calcifications in the  descending thoracic aorta as well as the right coronary artery. Hepatobiliary: No suspicious cystic or solid hepatic lesions. Status post cholecystectomy. Minimal intrahepatic biliary ductal dilatation. Common bile duct measures 6 mm in the porta hepatis. Pancreas: No definite pancreatic mass or peripancreatic fluid collections or inflammatory changes. Spleen: Small calcified granulomas in the spleen. Otherwise, unremarkable. Adrenals/Urinary Tract: In the upper pole of the left kidney (coronal image 48 of series 3) there is an exophytic 1.8 cm intermediate attenuation (27 HU) lesion which is incompletely characterized. Multiple other low-attenuation lesions in both kidneys are noted, compatible with simple cysts, largest of which is a very large exophytic lesion which appears to arise from the left kidney filling much of the left retroperitoneum (axial image 14 of series 2 and coronal image 56 of series 3) estimated to measure proximally 5.8 x 14.3 x 5.7 cm. Innumerable smaller subcentimeter low-attenuation lesions in both kidneys are too small to definitively characterize, but statistically likely to represent tiny cysts. No hydroureteronephrosis. Urinary bladder is unremarkable in appearance. Bilateral adrenal glands are normal in appearance. Stomach/Bowel: The appearance of the stomach is unremarkable. No pathologic dilatation of small bowel or colon. Extensive colonic diverticulosis is noted, most severe throughout the sigmoid colon, without surrounding inflammatory changes to indicate an acute diverticulitis at this time. Normal appendix. Vascular/Lymphatic: Atherosclerosis throughout the abdominal aorta and pelvic vasculature. No aneurysm or dissection. No lymphadenopathy noted in the abdomen or pelvis. Reproductive: Uterus and ovaries are atrophic and otherwise unremarkable in appearance. Other: No significant volume of ascites.  No pneumoperitoneum. Musculoskeletal: Chronic appearing compression fracture of  L3 with 40% loss of central vertebral body height. There are no aggressive appearing lytic or blastic lesions noted in the visualized portions of the skeleton. IMPRESSION: 1. No acute findings are noted in the abdomen or pelvis to account for the patient's symptoms. 2. Extensive colonic diverticulosis without evidence of acute diverticulitis at this time. 3. Indeterminate 1.8 cm exophytic lesion arising from the upper pole of the left kidney. This is favored to represent a hemorrhagic or proteinaceous cyst, however, further evaluation with nonemergent abdominal MRI with and without IV gadolinium is recommended in the near future to exclude the possibility of a solid renal neoplasm. 4. Aortic atherosclerosis, in addition to at least right coronary artery disease. Aortic Atherosclerosis (ICD10-I70.0). Electronically Signed   By: Trudie Reed M.D.   On: 10/11/2023 06:57   DG CHEST PORT 1 VIEW Result Date: 10/11/2023 CLINICAL DATA:  Hypoxia EXAM: PORTABLE CHEST 1 VIEW COMPARISON:  10/10/2023 FINDINGS: Cardiomegaly. Mild hyperinflation. Linear left lower lobe/retrocardiac opacities, favor atelectasis. No confluent opacity on the right. No effusions or acute bony abnormality. IMPRESSION: Cardiomegaly. Left retrocardiac linear opacities, favor atelectasis. Electronically Signed   By: Charlett Nose M.D.   On: 10/11/2023 01:06   US Venous Img Lower Bilateral (DVT) Result Date: 10/10/2023 CLINICAL DATA:  Bilateral lower extremity edema EXAM: BILATERAL LOWER EXTREMITY VENOUS DOPPLER ULTRASOUND TECHNIQUE: Gray-scale sonography with compression, as well as color and duplex ultrasound, were performed to evaluate the deep venous system(s) from the  level of the common femoral vein through the popliteal and proximal calf veins. COMPARISON:  None Available. FINDINGS: VENOUS Normal compressibility of the common femoral, superficial femoral, and popliteal veins, as well as the visualized calf veins. Visualized portions of  profunda femoral vein and great saphenous vein unremarkable. No filling defects to suggest DVT on grayscale or color Doppler imaging. Doppler waveforms show normal direction of venous flow, normal respiratory plasticity and response to augmentation. OTHER None. Limitations: none IMPRESSION: 1. No evidence of deep venous thrombosis within either lower extremity. Electronically Signed   By: Sharlet Salina M.D.   On: 10/10/2023 18:45   MR ANGIO HEAD WO CONTRAST Result Date: 10/10/2023 CLINICAL DATA:  TIA, follow-up MRI brain without contrast EXAM: MRA HEAD WITHOUT CONTRAST TECHNIQUE: Angiographic images of the Circle of Willis were acquired using MRA technique without intravenous contrast. COMPARISON:  No prior MRA available, correlation is made with 10/10/2023 MRI FINDINGS: Anterior circulation: Both internal carotid arteries are patent to the termini, without significant stenosis. A1 segments patent. Normal anterior communicating artery. Anterior cerebral arteries are patent to their distal aspects without significant stenosis. No M1 stenosis or occlusion. Distal MCA branches perfused to their distal aspects without significant stenosis. Posterior circulation: Vertebral arteries patent to the vertebrobasilar junction without stenosis. Posterior inferior cerebral arteries patent bilaterally. Basilar patent to its distal aspect. Superior cerebellar arteries patent proximally. Patent P1 segments. PCAs perfused to their distal aspects without significant stenosis. The bilateral posterior communicating arteries are not visualized. Anatomic variants: None significant IMPRESSION: No intracranial large vessel occlusion or significant stenosis. Electronically Signed   By: Wiliam Ke M.D.   On: 10/10/2023 16:07   ECHOCARDIOGRAM COMPLETE BUBBLE STUDY Result Date: 10/10/2023    ECHOCARDIOGRAM REPORT   Patient Name:   JOZETTE KAID Date of Exam: 10/10/2023 Medical Rec #:  [id number]    Height:       66.0 in Accession #:     [id number]   Weight:       157.0 lb Date of Birth:  16-Jul-1941    BSA:          1.804 m Patient Age:    82 years     BP:           137/65 mmHg Patient Gender: F            HR:           80 bpm. [facility] Procedure: 2D Echo, Cardiac Doppler, Color Doppler and Saline Contrast Bubble            Study Indications:    Stroke  History:        Patient has no prior history of Echocardiogram examinations.                 Stroke and COPD; Signs/Symptoms:Edema.  Sonographer:    Sheralyn Boatman RDCS Referring Phys: [id number] PRIYANKA O YADAV IMPRESSIONS  1. Left ventricular ejection fraction, by estimation, is 60 to 65%. The left ventricle has normal function. The left ventricle has no regional wall motion abnormalities. There is mild left ventricular hypertrophy. Left ventricular diastolic parameters are consistent with Grade I diastolic dysfunction (impaired relaxation).  2. Right ventricular systolic function is normal. The right ventricular size is normal.  3. The mitral valve is abnormal. Mild mitral valve regurgitation. No evidence of mitral stenosis.  4. The tricuspid valve is abnormal.  5. The aortic valve is tricuspid. There is mild calcification of the aortic valve. There is  mild thickening of the aortic valve. Aortic valve regurgitation is not visualized. No aortic stenosis is present.  6. IVC is small suggesting low RA pressure and hypovolemia.  7. Agitated saline contrast bubble study was negative, with no evidence of any interatrial shunt. FINDINGS  Left Ventricle: Left ventricular ejection fraction, by estimation, is 60 to 65%. The left ventricle has normal function. The left ventricle has no regional wall motion abnormalities. The left ventricular internal cavity size was normal in size. There is  mild left ventricular hypertrophy. Left ventricular diastolic parameters are consistent with Grade I diastolic dysfunction (impaired relaxation). Normal left ventricular filling pressure. Right Ventricle: The  right ventricular size is normal. Right vetricular wall thickness was not well visualized. Right ventricular systolic function is normal. Left Atrium: Left atrial size was normal in size. Right Atrium: Right atrial size was normal in size. Pericardium: There is no evidence of pericardial effusion. Mitral Valve: The mitral valve is abnormal. Mild mitral valve regurgitation. No evidence of mitral valve stenosis. Tricuspid Valve: The tricuspid valve is abnormal. Tricuspid valve regurgitation is mild . No evidence of tricuspid stenosis. Aortic Valve: The aortic valve is tricuspid. There is mild calcification of the aortic valve. There is mild thickening of the aortic valve. There is mild aortic valve annular calcification. Aortic valve regurgitation is not visualized. No aortic stenosis  is present. Aortic valve mean gradient measures 4.3 mmHg. Aortic valve peak gradient measures 11.7 mmHg. Aortic valve area, by VTI measures 2.55 cm. Pulmonic Valve: The pulmonic valve was not well visualized. Pulmonic valve regurgitation is not visualized. No evidence of pulmonic stenosis. Aorta: The aortic root and ascending aorta are structurally normal, with no evidence of dilitation. Venous: IVC is small suggesting low RA pressure and hypovolemia. IAS/Shunts: No atrial level shunt detected by color flow Doppler. Agitated saline contrast was given intravenously to evaluate for intracardiac shunting. Agitated saline contrast bubble study was negative, with no evidence of any interatrial shunt.  LEFT VENTRICLE PLAX 2D LVIDd:         3.80 cm     Diastology LVIDs:         1.90 cm     LV e' medial:    5.87 cm/s LV PW:         1.20 cm     LV E/e' medial:  13.9 LV IVS:        1.20 cm     LV e' lateral:   6.96 cm/s LVOT diam:     2.10 cm     LV E/e' lateral: 11.7 LV SV:         81 LV SV Index:   45 LVOT Area:     3.46 cm  LV Volumes (MOD) LV vol d, MOD A2C: 78.0 ml LV vol d, MOD A4C: 68.5 ml LV vol s, MOD A2C: 34.1 ml LV vol s, MOD A4C:  21.7 ml LV SV MOD A2C:     43.9 ml LV SV MOD A4C:     68.5 ml LV SV MOD BP:      48.4 ml RIGHT VENTRICLE             IVC RV S prime:     10.20 cm/s  IVC diam: 1.60 cm TAPSE (M-mode): 2.1 cm LEFT ATRIUM             Index        RIGHT ATRIUM           Index LA diam:  4.00 cm 2.22 cm/m   RA Area:     11.60 cm LA Vol (A2C):   48.5 ml 26.88 ml/m  RA Volume:   24.80 ml  13.75 ml/m LA Vol (A4C):   22.7 ml 12.58 ml/m LA Biplane Vol: 33.4 ml 18.51 ml/m  AORTIC VALVE AV Area (Vmax):    2.27 cm AV Area (Vmean):   2.83 cm AV Area (VTI):     2.55 cm AV Vmax:           171.03 cm/s AV Vmean:          93.468 cm/s AV VTI:            0.316 m AV Peak Grad:      11.7 mmHg AV Mean Grad:      4.3 mmHg LVOT Vmax:         112.00 cm/s LVOT Vmean:        76.300 cm/s LVOT VTI:          0.233 m LVOT/AV VTI ratio: 0.74  AORTA Ao Root diam: 3.20 cm Ao Asc diam:  3.20 cm MITRAL VALVE MV Area (PHT): 3.48 cm     SHUNTS MV Decel Time: 218 msec     Systemic VTI:  0.23 m MV E velocity: 81.40 cm/s   Systemic Diam: 2.10 cm MV A velocity: 119.00 cm/s MV E/A ratio:  0.68 Dina Rich MD Electronically signed by Dina Rich MD Signature Date/Time: 10/10/2023/2:28:28 PM    Final    MR BRAIN WO CONTRAST Result Date: 10/10/2023 CLINICAL DATA:  Neuro deficit, acute, stroke suspected EXAM: MRI HEAD WITHOUT CONTRAST TECHNIQUE: Multiplanar, multiecho pulse sequences of the brain and surrounding structures were obtained without intravenous contrast. COMPARISON:  None Available. FINDINGS: Brain: Negative for an acute infarct. No hemorrhage. No hydrocephalus. No extra-axial fluid collection. No mass effect. No mass lesion. There is a background of mild-to-moderate chronic microvascular ischemic change. Vascular: Normal flow voids. Skull and upper cervical spine: Normal marrow signal. Sinuses/Orbits: No middle ear or mastoid effusion. Polypoid mucosal thickening in the right maxillary sinus. Orbits are unremarkable. Other: None. IMPRESSION:  No acute intracranial process. Electronically Signed   By: Lorenza Cambridge M.D.   On: 10/10/2023 10:32   DG Chest Port 1 View Result Date: 10/10/2023 CLINICAL DATA:  Leg swelling, lethargy EXAM: PORTABLE CHEST 1 VIEW COMPARISON:  10/08/2019 FINDINGS: Mild retrocardiac/left lower lobe opacity, atelectasis versus pneumonia. Right lung is clear. No frank interstitial edema. No pleural effusion or pneumothorax. Cardiomegaly. IMPRESSION: Mild retrocardiac/left lower lobe opacity, atelectasis versus pneumonia. Cardiomegaly.  No frank interstitial edema. Electronically Signed   By: Charline Bills M.D.   On: 10/10/2023 03:41    Microbiology: Results for orders placed or performed during the hospital encounter of 10/10/23  Culture, blood (routine x 2)     Status: None   Collection Time: 10/10/23  4:31 AM   Specimen: BLOOD  Result Value Ref Range Status   Specimen Description BLOOD BLOOD RIGHT ARM  Final   Special Requests   Final    BOTTLES DRAWN AEROBIC AND ANAEROBIC Blood Culture adequate volume   Culture   Final    NO GROWTH 5 DAYS Performed at [facility], [address], [place], [place] [address]    Report Status 10/15/2023 FINAL  Final  Culture, blood (routine x 2)     Status: Abnormal   Collection Time: 10/10/23  4:33 AM   Specimen: BLOOD  Result Value Ref Range Status   Specimen Description  Final    BLOOD BLOOD RIGHT HAND Performed at [facility], [address], [place], [place] [address]    Special Requests   Final    BOTTLES DRAWN AEROBIC AND ANAEROBIC Blood Culture adequate volume Performed at [facility], [address] [place], [place] [address]    Culture  Setup Time   Final    GRAM NEGATIVE RODS Gram Stain Report Called to,Read Back By and Verified With: REYNOLDS,M @ 0118 ON 10/11/23 BY JUW AEROBIC BOTTLE ONLY GS DONE @ [facility] CRITICAL RESULT CALLED TO, READ BACK BY AND VERIFIED WITH: M REYNOLDS,RN@0447  10/11/23 MK Performed at [facility], [address]  [place], [place] [address]    Culture PSEUDOMONAS AERUGINOSA (A)  Final   Report Status 10/13/2023 FINAL  Final   Organism ID, Bacteria PSEUDOMONAS AERUGINOSA  Final      Susceptibility   Pseudomonas aeruginosa - MIC*    CEFTAZIDIME <=1 SENSITIVE Sensitive     CIPROFLOXACIN <=0.25 SENSITIVE Sensitive     GENTAMICIN <=1 SENSITIVE Sensitive     IMIPENEM <=0.25 SENSITIVE Sensitive     PIP/TAZO <=4 SENSITIVE Sensitive ug/mL    CEFEPIME 2 SENSITIVE Sensitive     * PSEUDOMONAS AERUGINOSA  Blood Culture ID Panel (Reflexed)     Status: Abnormal   Collection Time: 10/10/23  4:33 AM  Result Value Ref Range Status   Enterococcus faecalis NOT DETECTED NOT DETECTED Final   Enterococcus Faecium NOT DETECTED NOT DETECTED Final   Listeria monocytogenes NOT DETECTED NOT DETECTED Final   Staphylococcus species NOT DETECTED NOT DETECTED Final   Staphylococcus aureus (BCID) NOT DETECTED NOT DETECTED Final   Staphylococcus epidermidis NOT DETECTED NOT DETECTED Final   Staphylococcus lugdunensis NOT DETECTED NOT DETECTED Final   Streptococcus species NOT DETECTED NOT DETECTED Final   Streptococcus agalactiae NOT DETECTED NOT DETECTED Final   Streptococcus pneumoniae NOT DETECTED NOT DETECTED Final   Streptococcus pyogenes NOT DETECTED NOT DETECTED Final   A.calcoaceticus-baumannii NOT DETECTED NOT DETECTED Final   Bacteroides fragilis NOT DETECTED NOT DETECTED Final   Enterobacterales NOT DETECTED NOT DETECTED Final   Enterobacter cloacae complex NOT DETECTED NOT DETECTED Final   Escherichia coli NOT DETECTED NOT DETECTED Final   Klebsiella aerogenes NOT DETECTED NOT DETECTED Final   Klebsiella oxytoca NOT DETECTED NOT DETECTED Final   Klebsiella pneumoniae NOT DETECTED NOT DETECTED Final   Proteus species NOT DETECTED NOT DETECTED Final   Salmonella species NOT DETECTED NOT DETECTED Final   Serratia marcescens NOT DETECTED NOT DETECTED Final   Haemophilus influenzae NOT DETECTED NOT DETECTED Final    Neisseria meningitidis NOT DETECTED NOT DETECTED Final   Pseudomonas aeruginosa DETECTED (A) NOT DETECTED Final    Comment: CRITICAL RESULT CALLED TO, READ BACK BY AND VERIFIED WITH: M REYNOLDS,RN@0447  10/11/23 MK    Stenotrophomonas maltophilia NOT DETECTED NOT DETECTED Final   Candida albicans NOT DETECTED NOT DETECTED Final   Candida auris NOT DETECTED NOT DETECTED Final   Candida glabrata NOT DETECTED NOT DETECTED Final   Candida krusei NOT DETECTED NOT DETECTED Final   Candida parapsilosis NOT DETECTED NOT DETECTED Final   Candida tropicalis NOT DETECTED NOT DETECTED Final   Cryptococcus neoformans/gattii NOT DETECTED NOT DETECTED Final   CTX-M ESBL NOT DETECTED NOT DETECTED Final   Carbapenem resistance IMP NOT DETECTED NOT DETECTED Final   Carbapenem resistance KPC NOT DETECTED NOT DETECTED Final   Carbapenem resistance NDM NOT DETECTED NOT DETECTED Final   Carbapenem resistance VIM NOT DETECTED NOT  DETECTED Final    Comment: Performed at [facility], [address], [place], [place] [address]  SARS Coronavirus 2 by RT PCR (hospital order, performed in [facility] hospital lab) *cepheid single result test* Anterior Nasal Swab     Status: None   Collection Time: 10/10/23  6:41 AM   Specimen: Anterior Nasal Swab  Result Value Ref Range Status   SARS Coronavirus 2 by RT PCR NEGATIVE NEGATIVE Final    Comment: (NOTE) SARS-CoV-2 target nucleic acids are NOT DETECTED.  The SARS-CoV-2 RNA is generally detectable in upper and lower respiratory specimens during the acute phase of infection. The lowest concentration of SARS-CoV-2 viral copies this assay can detect is 250 copies / mL. A negative result does not preclude SARS-CoV-2 infection and should not be used as the sole basis for treatment or other patient management decisions.  A negative result may occur with improper specimen collection / handling, submission of specimen other than nasopharyngeal swab, presence of viral  mutation(s) within the areas targeted by this assay, and inadequate number of viral copies (<250 copies / mL). A negative result must be combined with clinical observations, patient history, and epidemiological information.  Fact Sheet for Patients:   RoadLapTop.co.za  Fact Sheet for Healthcare Providers: http://kim-miller.com/  This test is not yet approved or  cleared by the [place] FDA and has been authorized for detection and/or diagnosis of SARS-CoV-2 by FDA under an Emergency Use Authorization (EUA).  This EUA will remain in effect (meaning this test can be used) for the duration of the COVID-19 declaration under Section 564(b)(1) of the Act, 21 U.S.C. section 360bbb-3(b)(1), unless the authorization is terminated or revoked sooner.  Performed at [facility], [address], [place], [place] [address]   Respiratory (~20 pathogens) panel by PCR     Status: None   Collection Time: 10/10/23  1:08 PM   Specimen: Nasopharyngeal Swab; Respiratory  Result Value Ref Range Status   Adenovirus NOT DETECTED NOT DETECTED Final   Coronavirus 229E NOT DETECTED NOT DETECTED Final    Comment: (NOTE) The Coronavirus on the Respiratory Panel, DOES NOT test for the novel  Coronavirus (2019 nCoV)    Coronavirus HKU1 NOT DETECTED NOT DETECTED Final   Coronavirus NL63 NOT DETECTED NOT DETECTED Final   Coronavirus OC43 NOT DETECTED NOT DETECTED Final   Metapneumovirus NOT DETECTED NOT DETECTED Final   Rhinovirus / Enterovirus NOT DETECTED NOT DETECTED Final   Influenza A NOT DETECTED NOT DETECTED Final   Influenza B NOT DETECTED NOT DETECTED Final   Parainfluenza Virus 1 NOT DETECTED NOT DETECTED Final   Parainfluenza Virus 2 NOT DETECTED NOT DETECTED Final   Parainfluenza Virus 3 NOT DETECTED NOT DETECTED Final   Parainfluenza Virus 4 NOT DETECTED NOT DETECTED Final   Respiratory Syncytial Virus NOT DETECTED NOT DETECTED Final   Bordetella  pertussis NOT DETECTED NOT DETECTED Final   Bordetella Parapertussis NOT DETECTED NOT DETECTED Final   Chlamydophila pneumoniae NOT DETECTED NOT DETECTED Final   Mycoplasma pneumoniae NOT DETECTED NOT DETECTED Final    Comment: Performed at [facility], [address] [place], [place] [address]  MRSA Next Gen by PCR, Nasal     Status: None   Collection Time: 10/11/23  5:30 AM   Specimen: Nasal Mucosa; Nasal Swab  Result Value Ref Range Status   MRSA by PCR Next Gen NOT DETECTED NOT DETECTED Final    Comment: (NOTE) The GeneXpert MRSA Assay (FDA approved for  NASAL specimens only), is one component of a comprehensive MRSA colonization surveillance program. It is not intended to diagnose MRSA infection nor to guide or monitor treatment for MRSA infections. Test performance is not FDA approved in patients less than 38 years old. Performed at [facility], [address], [place], [place] [address]   SARS Coronavirus 2 by RT PCR (hospital order, performed in [facility] hospital lab) *cepheid single result test*     Status: None   Collection Time: 10/14/23 12:10 PM  Result Value Ref Range Status   SARS Coronavirus 2 by RT PCR NEGATIVE NEGATIVE Final    Comment: (NOTE) SARS-CoV-2 target nucleic acids are NOT DETECTED.  The SARS-CoV-2 RNA is generally detectable in upper and lower respiratory specimens during the acute phase of infection. The lowest concentration of SARS-CoV-2 viral copies this assay can detect is 250 copies / mL. A negative result does not preclude SARS-CoV-2 infection and should not be used as the sole basis for treatment or other patient management decisions.  A negative result may occur with improper specimen collection / handling, submission of specimen other than nasopharyngeal swab, presence of viral mutation(s) within the areas targeted by this assay, and inadequate number of viral copies (<250 copies / mL). A negative result must be combined with  clinical observations, patient history, and epidemiological information.  Fact Sheet for Patients:   RoadLapTop.co.za  Fact Sheet for Healthcare Providers: http://kim-miller.com/  This test is not yet approved or  cleared by the [place] FDA and has been authorized for detection and/or diagnosis of SARS-CoV-2 by FDA under an Emergency Use Authorization (EUA).  This EUA will remain in effect (meaning this test can be used) for the duration of the COVID-19 declaration under Section 564(b)(1) of the Act, 21 U.S.C. section 360bbb-3(b)(1), unless the authorization is terminated or revoked sooner.  Performed at [facility], [address] [place], [place] [address]     Labs: CBC: Recent Labs  Lab 10/10/23 0326 10/11/23 0642 10/13/23 0532  WBC 4.1 4.9 6.7  NEUTROABS 2.8  --   --   HGB 10.5* 10.7* 10.4*  HCT 34.2* 34.2* 33.6*  MCV 95.3 94.5 92.3  PLT 131* 146* 161   Basic Metabolic Panel: Recent Labs  Lab 10/10/23 0326 10/11/23 0642 10/13/23 0532 10/14/23 0354  NA 136 136 131* 135  K 4.4 4.4 3.9 3.9  CL 94* 88* 90* 94*  CO2 38* 40* 34* 33*  GLUCOSE 103* 87 84 84  BUN 18 20 19 22   CREATININE 0.69 0.88 1.01* 0.82  CALCIUM 8.6* 8.9 8.8* 9.0  MG  --  2.1  --   --   PHOS  --  3.6  --   --    Liver Function Tests: Recent Labs  Lab 10/10/23 0326  AST 32  ALT 25  ALKPHOS 81  BILITOT 0.6  PROT 6.1*  ALBUMIN 3.1*   CBG: Recent Labs  Lab 10/11/23 0026  GLUCAP 110*    Discharge time spent: greater than 30 minutes.  Signed: Catarina Hartshorn, MD [facility] 10/15/2023

## 2023-10-15 NOTE — Care Management Important Message (Signed)
Important Message  Patient Details  Name: Danielle Harmon MRN: [id number] Date of Birth: Dec 09, 1940   Important Message Given:  Yes - Medicare IM     Corey Harold 10/15/2023, 10:37 AM

## 2023-10-15 NOTE — Plan of Care (Signed)
  Problem: Health Behavior/Discharge Planning: Goal: Ability to manage health-related needs will improve Outcome: Progressing   Problem: Clinical Measurements: Goal: Ability to maintain clinical measurements within normal limits will improve Outcome: Progressing Goal: Will remain free from infection Outcome: Progressing Goal: Diagnostic test results will improve Outcome: Progressing Goal: Respiratory complications will improve Outcome: Progressing Goal: Cardiovascular complication will be avoided Outcome: Progressing   Problem: Activity: Goal: Risk for activity intolerance will decrease Outcome: Progressing   Problem: Nutrition: Goal: Adequate nutrition will be maintained Outcome: Progressing   Problem: Elimination: Goal: Will not experience complications related to bowel motility Outcome: Progressing Goal: Will not experience complications related to urinary retention Outcome: Progressing   Problem: Pain Managment: Goal: General experience of comfort will improve and/or be controlled Outcome: Progressing   Problem: Safety: Goal: Ability to remain free from injury will improve Outcome: Progressing   Problem: Skin Integrity: Goal: Risk for impaired skin integrity will decrease Outcome: Progressing   Problem: Education: Goal: Knowledge of disease or condition will improve Outcome: Progressing Goal: Knowledge of secondary prevention will improve (MUST DOCUMENT ALL) Outcome: Progressing Goal: Knowledge of patient specific risk factors will improve (DELETE if not current risk factor) Outcome: Progressing   Problem: Ischemic Stroke/TIA Tissue Perfusion: Goal: Complications of ischemic stroke/TIA will be minimized Outcome: Progressing   Problem: Health Behavior/Discharge Planning: Goal: Ability to manage health-related needs will improve Outcome: Progressing   Problem: Self-Care: Goal: Ability to participate in self-care as condition permits will improve Outcome:  Progressing Goal: Verbalization of feelings and concerns over difficulty with self-care will improve Outcome: Progressing Goal: Ability to communicate needs accurately will improve Outcome: Progressing   Problem: Nutrition: Goal: Risk of aspiration will decrease Outcome: Progressing Goal: Dietary intake will improve Outcome: Progressing

## 2023-10-16 DIAGNOSIS — R2990 Unspecified symptoms and signs involving the nervous system: Secondary | ICD-10-CM | POA: Diagnosis not present

## 2023-10-16 DIAGNOSIS — R7881 Bacteremia: Secondary | ICD-10-CM

## 2023-10-16 MED ORDER — CLOPIDOGREL BISULFATE 75 MG PO TABS: 75.0000 mg | ORAL_TABLET | Freq: Every day | ORAL | Status: DC

## 2023-10-16 NOTE — Progress Notes (Signed)
Spoke with Ladoris Gene, RN at [facility] and provided report for patient.

## 2023-10-16 NOTE — Discharge Summary (Signed)
Physician Discharge Summary   Patient: Danielle Harmon MRN: [id number] DOB: 06-29-1941  Admit date:     10/10/2023  Discharge date: 10/16/23  Discharge Physician: Onalee Hua Jaylene Schrom   PCP: Bennie Pierini, FNP   Recommendations at discharge:   Please follow up with primary care provider within 1-2 weeks  Please repeat BMP and CBC in one week  Please arrange 30 day heart monitor as recommended by neurology as part of her TIA work up      Hospital Course: Danielle Harmon is an 83 y.o. female with a history of 6L O2-dependent COPD, HTN, and GERD who presented to the ED on 10/10/2023 with right arm tingling/numbness with concern for stroke. She also reported increasing leg swelling. Vital signs were stable, requiring 4L O2 (whereas baseline reported to be 6L) with the exception of hypothermia (rectal temperature of 91.49F) without other evidence of sepsis. Blood work was essentially Doctor, general practice. CXR demonstrated a possible retrocardiac opacity (infiltrate vs. atelectasis), UA negative. EDP requested admission for hypothermia. Blood cultures ultimately grew Pseudomonas for which ceftriaxone, started at admission, was changed to zosyn. Creatinine slightly elevated 1/20, changed abx to cefepime pending ID input.    CT head had shown no acute abnormality and subsequent MRI brain was ordered, has resulted without evidence of stroke. Neurology was consulted, work up completed and cardiac monitoring recommended after discharge. She had an episode of desaturations 1/18 prompting transfer to SDU though has stabilized durably from this. Her breathing status is improved, neurological symptoms subsided.  The patient did have some garbled speech in the afternoon of 10/14/23.  Repeat CT of the brain was negative.  The patient does not have any new focal neurologic deficits.  This was felt to be due to hospital delirium. On the morning of discharge, the patient was at her usual baseline as described on 10/14/2023.   Her speech was understandable and clear.  She did not have any further focal neurologic deficits on examination prior to discharge.  The patient continued to remain clinically stable without any further neurologic deficits.  Her discharge was delayed secondary to delay in transportation.  Assessment and Plan: Right sided numbness, TIA: CT nonacute, MRI nonacute with chronic microvascular changes. MRA without LVO/significant stenosis, carotid U/S with < 49% stenosis. Echo w/bubble study is negative for IAS/CES.  - PT/OT/SLP > will pursue SNF rehab. Likely DC date 1/22. Facility requested covid test (ordered and negative) -The patient remained clinically stable for discharge.  Delay in discharge was secondary to delays with transportation - Neurology consulted, recommending DAPT x3 weeks, then aspirin monotherapy, neurology follow up after discharge.  - With negative work up as above, cardiac monitoring after discharge is recommended. Given that she will be in [place], [place], will allow SNF MD to place referral. - LDL is 78 (though HDL is 115!), started atorvastatin 40mg . Pt amenable. - HbA1c is 4.7%. -Continue Plavix with aspirin for 15 more days after discharge, then aspirin 81 mg monotherapy thereafter   LLL CAP/bronchiectasis: Unable to rule out pneumonia, ?etiology of Pseudomonas bacteremia. Full viral panel negative.  - Abx as discussed below>> plan ciprofloxacin through 10/17/2023   Pseudomonas bacteremia: CT abd/pelvis without nidus for infection, UA was negative. Odd presentation with hypothermia but no other evidence of sepsis (normal WBC, hemodynamics), and the fact that hypothermia resolved with antibiotics that would not be active against this pathogen.  - Changed CTX to zosyn and per discussion with ID, ok to transition to po and complete 7 total days of adequate therapy (  1/17 - 1/24 final dose) with cipro.    Acute on chronic hypoxic respiratory failure, COPD:  - Continue supplemental  oxygen at her baseline. Daughter reports that she often sleeps mouth breathing and had episode inpatient overnight into 1/18 where she was found doing this with SpO2 in 70%'s. She has returned to her baseline which is 6L O2 and is with normal respiratory effort and SpO2 in mid-high 90%'s. Now actually only requiring 4L O2. Could consider BiPAP qHS with bleed in O2.  - Continue bronchodilators, scheduled duoneb, so changed breztri to dulera for now, and prn albuterol. No exacerbation currently noted, so avoiding steroids. Plan to resume home meds at DC.  -Currently stable on 4 L at the time of discharge   Lower extremity edema: This is a chronic issue. LE U/S negative for DVT. Echo without significant overload, small caliber IVC in fact, and NORMAL RVSF. Hypoalbuminemia likely contributing, also suspect venous insufficiency, though no dermatitis/siderosis, etc. Negative dipstick protein on UA.  - Elevate, compression - Ok to continue lasix after discharge.  - Restart home norvasc as BP is rising.   GERD:  - Continue PPI   Goals of care counseling, DNR (POA):  - DNR confirmed. Pt amenable to current measures, but would not consent to intubation/mechanical ventilation.    Exophytic left renal lesion: D/w pt's daughter the need for nonemergent MRI for further characterization and to r/o RCC.    Decreased visual acuity: No central neurological etiology identified, no APD. Recommend outpatient ophthalmology follow up. Discussed w/ pt, family.          Consultants: neurology Procedures performed: none  Disposition: Home Diet recommendation:  Cardiac diet DISCHARGE MEDICATION: Allergies as of 10/16/2023       Reactions   Bee Venom Anaphylaxis        Medication List     TAKE these medications    albuterol (2.5 MG/3ML) 0.083% nebulizer solution Commonly known as: PROVENTIL Take 3 mLs (2.5 mg total) by nebulization every 6 (six) hours as needed for wheezing or shortness of breath. Pt  may use every 4 to 6 hours as needed What changed: Another medication with the same name was changed. Make sure you understand how and when to take each.   albuterol 108 (90 Base) MCG/ACT inhaler Commonly known as: VENTOLIN HFA TAKE 2 PUFFS BY MOUTH EVERY 6 HOURS AS NEEDED FOR WHEEZE OR SHORTNESS OF BREATH What changed: See the new instructions.   amLODipine 5 MG tablet Commonly known as: NORVASC TAKE 1 TABLET (5 MG TOTAL) BY MOUTH DAILY.   aspirin 81 MG chewable tablet Chew 1 tablet (81 mg total) by mouth daily.   atorvastatin 40 MG tablet Commonly known as: LIPITOR Take 1 tablet (40 mg total) by mouth daily.   Breztri Aerosphere 160-9-4.8 MCG/ACT Aero Generic drug: Budeson-Glycopyrrol-Formoterol TAKE 2 PUFFS BY MOUTH TWICE A DAY   ciprofloxacin 750 MG tablet Commonly known as: CIPRO Take 1 tablet (750 mg total) by mouth 2 (two) times daily. Last day cipro on 10/17/23 (stop after evening dose)   clopidogrel 75 MG tablet Commonly known as: PLAVIX Take 1 tablet (75 mg total) by mouth daily. X 15 more days, then discontinue   furosemide 20 MG tablet Commonly known as: LASIX TAKE 2 TABLETS (40 MG TOTAL) BY MOUTH DAILY.   guaiFENesin 600 MG 12 hr tablet Commonly known as: MUCINEX Take 600 mg by mouth daily.   omeprazole 20 MG capsule Commonly known as: PRILOSEC Take 1 capsule (20 mg  total) by mouth daily.   Spacer/Aero Chamber [employer] Use as directed   triamcinolone 55 MCG/ACT Aero nasal inhaler Commonly known as: NASACORT Place 2 sprays into the nose daily. What changed:  when to take this reasons to take this        Discharge Exam: Filed Weights   10/10/23 0312 10/11/23 0525 10/12/23 0356  Weight: 71.2 kg 71.8 kg 65.6 kg   HEENT:  [place]/AT, No thrush, no icterus CV:  RRR, no rub, no S3, no S4 Lung:  bibasilar crackles.  No wheeze Abd:  soft/+BS, NT Ext:  trace LE edema, no lymphangitis, no synovitis, no rash Neuro:  CN II-XII intact, strength 4-/5  in RUE, RLE, strength 4/5 LUE, LLE; sensation intact bilateral; no dysmetria; babinski equivocal    Condition at discharge: stable  The results of significant diagnostics from this hospitalization (including imaging, microbiology, ancillary and laboratory) are listed below for reference.   Imaging Studies: CT HEAD WO CONTRAST ( ) Result Date: 10/14/2023 CLINICAL DATA:  Transient ischemic attack. Neurological deficit, acute, stroke suspected. EXAM: CT HEAD WITHOUT CONTRAST TECHNIQUE: Contiguous axial images were obtained from the base of the skull through the vertex without intravenous contrast. RADIATION DOSE REDUCTION: This exam was performed according to the departmental dose-optimization program which includes automated exposure control, adjustment of the mA and/or kV according to patient size and/or use of iterative reconstruction technique. COMPARISON:  MRI 10/10/2023 FINDINGS: Brain: No acute CT finding. Chronic small-vessel ischemic changes affect the pons and cerebral hemispheric white matter. No sign of acute stroke, mass, hemorrhage, hydrocephalus or extra-axial collection. Vascular: There is atherosclerotic calcification of the major vessels at the base of the brain. Skull: Negative Sinuses/Orbits: Small amount of fluid in the sphenoid sinus. Other sinuses are clear. Orbits negative. Other: None IMPRESSION: 1. No acute CT finding. Chronic small-vessel ischemic changes of the pons and cerebral hemispheric white matter. 2. Small amount of fluid in the sphenoid sinus. Electronically Signed   By: Paulina Fusi M.D.   On: 10/14/2023 16:43   US Carotid Bilateral Result Date: 10/11/2023 CLINICAL DATA:  Stroke EXAM: BILATERAL CAROTID DUPLEX ULTRASOUND TECHNIQUE: Wallace Cullens scale imaging, color Doppler and duplex ultrasound were performed of bilateral carotid and vertebral arteries in the neck. COMPARISON:  None Available. FINDINGS: Criteria: Quantification of carotid stenosis is based on velocity  parameters that correlate the residual internal carotid diameter with NASCET-based stenosis levels, using the diameter of the distal internal carotid lumen as the denominator for stenosis measurement. The following velocity measurements were obtained: RIGHT ICA: 80/16 cm/sec CCA: 76/13 cm/sec SYSTOLIC ICA/CCA RATIO:  1.0 ECA:  122 cm/sec LEFT ICA: 94/17 cm/sec CCA: 90/11 cm/sec SYSTOLIC ICA/CCA RATIO:  1.0 ECA:  108 cm/sec RIGHT CAROTID ARTERY: Heterogeneous atherosclerotic plaque in the proximal internal carotid artery. By peak systolic velocity criteria, the estimated stenosis is less than 50%. RIGHT VERTEBRAL ARTERY:  Patent with normal antegrade flow. LEFT CAROTID ARTERY: Focal heterogeneous atherosclerotic plaque in the proximal internal carotid artery. By peak systolic velocity criteria, the estimated stenosis is less than 50%. LEFT VERTEBRAL ARTERY:  Patent with normal antegrade flow. IMPRESSION: 1. Mild (1-49%) stenosis proximal right internal carotid artery secondary to heterogenous atherosclerotic plaque. 2. Mild (1-49%) stenosis proximal left internal carotid artery secondary to heterogenous atherosclerotic plaque. 3. Vertebral arteries are patent with normal antegrade flow. Electronically Signed   By: Malachy Moan M.D.   On: 10/11/2023 07:12   CT ABDOMEN PELVIS W CONTRAST Result Date: 10/11/2023 CLINICAL DATA:  83 year old female with history  of gram-negative rod bacteremia. Evaluate for intra-abdominal source of infection. EXAM: CT ABDOMEN AND PELVIS WITH CONTRAST TECHNIQUE: Multidetector CT imaging of the abdomen and pelvis was performed using the standard protocol following bolus administration of intravenous contrast. RADIATION DOSE REDUCTION: This exam was performed according to the departmental dose-optimization program which includes automated exposure control, adjustment of the mA and/or kV according to patient size and/or use of iterative reconstruction technique. CONTRAST:   OMNIPAQUE IOHEXOL 300 MG/ML  SOLN COMPARISON:  None Available. FINDINGS: Lower chest: Scattered areas of bronchiectasis and scarring are noted in the lung bases bilaterally. Atherosclerotic calcifications in the descending thoracic aorta as well as the right coronary artery. Hepatobiliary: No suspicious cystic or solid hepatic lesions. Status post cholecystectomy. Minimal intrahepatic biliary ductal dilatation. Common bile duct measures 6 mm in the porta hepatis. Pancreas: No definite pancreatic mass or peripancreatic fluid collections or inflammatory changes. Spleen: Small calcified granulomas in the spleen. Otherwise, unremarkable. Adrenals/Urinary Tract: In the upper pole of the left kidney (coronal image 48 of series 3) there is an exophytic 1.8 cm intermediate attenuation (27 HU) lesion which is incompletely characterized. Multiple other low-attenuation lesions in both kidneys are noted, compatible with simple cysts, largest of which is a very large exophytic lesion which appears to arise from the left kidney filling much of the left retroperitoneum (axial image 14 of series 2 and coronal image 56 of series 3) estimated to measure proximally 5.8 x 14.3 x 5.7 cm. Innumerable smaller subcentimeter low-attenuation lesions in both kidneys are too small to definitively characterize, but statistically likely to represent tiny cysts. No hydroureteronephrosis. Urinary bladder is unremarkable in appearance. Bilateral adrenal glands are normal in appearance. Stomach/Bowel: The appearance of the stomach is unremarkable. No pathologic dilatation of small bowel or colon. Extensive colonic diverticulosis is noted, most severe throughout the sigmoid colon, without surrounding inflammatory changes to indicate an acute diverticulitis at this time. Normal appendix. Vascular/Lymphatic: Atherosclerosis throughout the abdominal aorta and pelvic vasculature. No aneurysm or dissection. No lymphadenopathy noted in the abdomen or  pelvis. Reproductive: Uterus and ovaries are atrophic and otherwise unremarkable in appearance. Other: No significant volume of ascites.  No pneumoperitoneum. Musculoskeletal: Chronic appearing compression fracture of L3 with 40% loss of central vertebral body height. There are no aggressive appearing lytic or blastic lesions noted in the visualized portions of the skeleton. IMPRESSION: 1. No acute findings are noted in the abdomen or pelvis to account for the patient's symptoms. 2. Extensive colonic diverticulosis without evidence of acute diverticulitis at this time. 3. Indeterminate 1.8 cm exophytic lesion arising from the upper pole of the left kidney. This is favored to represent a hemorrhagic or proteinaceous cyst, however, further evaluation with nonemergent abdominal MRI with and without IV gadolinium is recommended in the near future to exclude the possibility of a solid renal neoplasm. 4. Aortic atherosclerosis, in addition to at least right coronary artery disease. Aortic Atherosclerosis (ICD10-I70.0). Electronically Signed   By: Trudie Reed M.D.   On: 10/11/2023 06:57   DG CHEST PORT 1 VIEW Result Date: 10/11/2023 CLINICAL DATA:  Hypoxia EXAM: PORTABLE CHEST 1 VIEW COMPARISON:  10/10/2023 FINDINGS: Cardiomegaly. Mild hyperinflation. Linear left lower lobe/retrocardiac opacities, favor atelectasis. No confluent opacity on the right. No effusions or acute bony abnormality. IMPRESSION: Cardiomegaly. Left retrocardiac linear opacities, favor atelectasis. Electronically Signed   By: Charlett Nose M.D.   On: 10/11/2023 01:06   US Venous Img Lower Bilateral (DVT) Result Date: 10/10/2023 CLINICAL DATA:  Bilateral lower extremity edema  EXAM: BILATERAL LOWER EXTREMITY VENOUS DOPPLER ULTRASOUND TECHNIQUE: Gray-scale sonography with compression, as well as color and duplex ultrasound, were performed to evaluate the deep venous system(s) from the level of the common femoral vein through the popliteal and  proximal calf veins. COMPARISON:  None Available. FINDINGS: VENOUS Normal compressibility of the common femoral, superficial femoral, and popliteal veins, as well as the visualized calf veins. Visualized portions of profunda femoral vein and great saphenous vein unremarkable. No filling defects to suggest DVT on grayscale or color Doppler imaging. Doppler waveforms show normal direction of venous flow, normal respiratory plasticity and response to augmentation. OTHER None. Limitations: none IMPRESSION: 1. No evidence of deep venous thrombosis within either lower extremity. Electronically Signed   By: Sharlet Salina M.D.   On: 10/10/2023 18:45   MR ANGIO HEAD WO CONTRAST Result Date: 10/10/2023 CLINICAL DATA:  TIA, follow-up MRI brain without contrast EXAM: MRA HEAD WITHOUT CONTRAST TECHNIQUE: Angiographic images of the Circle of Willis were acquired using MRA technique without intravenous contrast. COMPARISON:  No prior MRA available, correlation is made with 10/10/2023 MRI FINDINGS: Anterior circulation: Both internal carotid arteries are patent to the termini, without significant stenosis. A1 segments patent. Normal anterior communicating artery. Anterior cerebral arteries are patent to their distal aspects without significant stenosis. No M1 stenosis or occlusion. Distal MCA branches perfused to their distal aspects without significant stenosis. Posterior circulation: Vertebral arteries patent to the vertebrobasilar junction without stenosis. Posterior inferior cerebral arteries patent bilaterally. Basilar patent to its distal aspect. Superior cerebellar arteries patent proximally. Patent P1 segments. PCAs perfused to their distal aspects without significant stenosis. The bilateral posterior communicating arteries are not visualized. Anatomic variants: None significant IMPRESSION: No intracranial large vessel occlusion or significant stenosis. Electronically Signed   By: Wiliam Ke M.D.   On: 10/10/2023  16:07   ECHOCARDIOGRAM COMPLETE BUBBLE STUDY Result Date: 10/10/2023    ECHOCARDIOGRAM REPORT   Patient Name:   LEONITA SIEBEL Date of Exam: 10/10/2023 Medical Rec #:  [id number]    Height:       66.0 in Accession #:    [id number]   Weight:       157.0 lb Date of Birth:  Nov 10, 1940    BSA:          1.804 m Patient Age:    82 years     BP:           137/65 mmHg Patient Gender: F            HR:           80 bpm. [facility] Procedure: 2D Echo, Cardiac Doppler, Color Doppler and Saline Contrast Bubble            Study Indications:    Stroke  History:        Patient has no prior history of Echocardiogram examinations.                 Stroke and COPD; Signs/Symptoms:Edema.  Sonographer:    Sheralyn Boatman RDCS Referring Phys: [id number] PRIYANKA O YADAV IMPRESSIONS  1. Left ventricular ejection fraction, by estimation, is 60 to 65%. The left ventricle has normal function. The left ventricle has no regional wall motion abnormalities. There is mild left ventricular hypertrophy. Left ventricular diastolic parameters are consistent with Grade I diastolic dysfunction (impaired relaxation).  2. Right ventricular systolic function is normal. The right ventricular size is normal.  3. The mitral valve is abnormal. Mild mitral valve regurgitation.  No evidence of mitral stenosis.  4. The tricuspid valve is abnormal.  5. The aortic valve is tricuspid. There is mild calcification of the aortic valve. There is mild thickening of the aortic valve. Aortic valve regurgitation is not visualized. No aortic stenosis is present.  6. IVC is small suggesting low RA pressure and hypovolemia.  7. Agitated saline contrast bubble study was negative, with no evidence of any interatrial shunt. FINDINGS  Left Ventricle: Left ventricular ejection fraction, by estimation, is 60 to 65%. The left ventricle has normal function. The left ventricle has no regional wall motion abnormalities. The left ventricular internal cavity size was normal in size.  There is  mild left ventricular hypertrophy. Left ventricular diastolic parameters are consistent with Grade I diastolic dysfunction (impaired relaxation). Normal left ventricular filling pressure. Right Ventricle: The right ventricular size is normal. Right vetricular wall thickness was not well visualized. Right ventricular systolic function is normal. Left Atrium: Left atrial size was normal in size. Right Atrium: Right atrial size was normal in size. Pericardium: There is no evidence of pericardial effusion. Mitral Valve: The mitral valve is abnormal. Mild mitral valve regurgitation. No evidence of mitral valve stenosis. Tricuspid Valve: The tricuspid valve is abnormal. Tricuspid valve regurgitation is mild . No evidence of tricuspid stenosis. Aortic Valve: The aortic valve is tricuspid. There is mild calcification of the aortic valve. There is mild thickening of the aortic valve. There is mild aortic valve annular calcification. Aortic valve regurgitation is not visualized. No aortic stenosis  is present. Aortic valve mean gradient measures 4.3 mmHg. Aortic valve peak gradient measures 11.7 mmHg. Aortic valve area, by VTI measures 2.55 cm. Pulmonic Valve: The pulmonic valve was not well visualized. Pulmonic valve regurgitation is not visualized. No evidence of pulmonic stenosis. Aorta: The aortic root and ascending aorta are structurally normal, with no evidence of dilitation. Venous: IVC is small suggesting low RA pressure and hypovolemia. IAS/Shunts: No atrial level shunt detected by color flow Doppler. Agitated saline contrast was given intravenously to evaluate for intracardiac shunting. Agitated saline contrast bubble study was negative, with no evidence of any interatrial shunt.  LEFT VENTRICLE PLAX 2D LVIDd:         3.80 cm     Diastology LVIDs:         1.90 cm     LV e' medial:    5.87 cm/s LV PW:         1.20 cm     LV E/e' medial:  13.9 LV IVS:        1.20 cm     LV e' lateral:   6.96 cm/s LVOT diam:      2.10 cm     LV E/e' lateral: 11.7 LV SV:         81 LV SV Index:   45 LVOT Area:     3.46 cm  LV Volumes (MOD) LV vol d, MOD A2C: 78.0 ml LV vol d, MOD A4C: 68.5 ml LV vol s, MOD A2C: 34.1 ml LV vol s, MOD A4C: 21.7 ml LV SV MOD A2C:     43.9 ml LV SV MOD A4C:     68.5 ml LV SV MOD BP:      48.4 ml RIGHT VENTRICLE             IVC RV S prime:     10.20 cm/s  IVC diam: 1.60 cm TAPSE (M-mode): 2.1 cm LEFT ATRIUM  Index        RIGHT ATRIUM           Index LA diam:        4.00 cm 2.22 cm/m   RA Area:     11.60 cm LA Vol (A2C):   48.5 ml 26.88 ml/m  RA Volume:   24.80 ml  13.75 ml/m LA Vol (A4C):   22.7 ml 12.58 ml/m LA Biplane Vol: 33.4 ml 18.51 ml/m  AORTIC VALVE AV Area (Vmax):    2.27 cm AV Area (Vmean):   2.83 cm AV Area (VTI):     2.55 cm AV Vmax:           171.03 cm/s AV Vmean:          93.468 cm/s AV VTI:            0.316 m AV Peak Grad:      11.7 mmHg AV Mean Grad:      4.3 mmHg LVOT Vmax:         112.00 cm/s LVOT Vmean:        76.300 cm/s LVOT VTI:          0.233 m LVOT/AV VTI ratio: 0.74  AORTA Ao Root diam: 3.20 cm Ao Asc diam:  3.20 cm MITRAL VALVE MV Area (PHT): 3.48 cm     SHUNTS MV Decel Time: 218 msec     Systemic VTI:  0.23 m MV E velocity: 81.40 cm/s   Systemic Diam: 2.10 cm MV A velocity: 119.00 cm/s MV E/A ratio:  0.68 Dina Rich MD Electronically signed by Dina Rich MD Signature Date/Time: 10/10/2023/2:28:28 PM    Final    MR BRAIN WO CONTRAST Result Date: 10/10/2023 CLINICAL DATA:  Neuro deficit, acute, stroke suspected EXAM: MRI HEAD WITHOUT CONTRAST TECHNIQUE: Multiplanar, multiecho pulse sequences of the brain and surrounding structures were obtained without intravenous contrast. COMPARISON:  None Available. FINDINGS: Brain: Negative for an acute infarct. No hemorrhage. No hydrocephalus. No extra-axial fluid collection. No mass effect. No mass lesion. There is a background of mild-to-moderate chronic microvascular ischemic change. Vascular: Normal flow  voids. Skull and upper cervical spine: Normal marrow signal. Sinuses/Orbits: No middle ear or mastoid effusion. Polypoid mucosal thickening in the right maxillary sinus. Orbits are unremarkable. Other: None. IMPRESSION: No acute intracranial process. Electronically Signed   By: Lorenza Cambridge M.D.   On: 10/10/2023 10:32   DG Chest Port 1 View Result Date: 10/10/2023 CLINICAL DATA:  Leg swelling, lethargy EXAM: PORTABLE CHEST 1 VIEW COMPARISON:  10/08/2019 FINDINGS: Mild retrocardiac/left lower lobe opacity, atelectasis versus pneumonia. Right lung is clear. No frank interstitial edema. No pleural effusion or pneumothorax. Cardiomegaly. IMPRESSION: Mild retrocardiac/left lower lobe opacity, atelectasis versus pneumonia. Cardiomegaly.  No frank interstitial edema. Electronically Signed   By: Charline Bills M.D.   On: 10/10/2023 03:41    Microbiology: Results for orders placed or performed during the hospital encounter of 10/10/23  Culture, blood (routine x 2)     Status: None   Collection Time: 10/10/23  4:31 AM   Specimen: BLOOD  Result Value Ref Range Status   Specimen Description BLOOD BLOOD RIGHT ARM  Final   Special Requests   Final    BOTTLES DRAWN AEROBIC AND ANAEROBIC Blood Culture adequate volume   Culture   Final    NO GROWTH 5 DAYS Performed at [facility], [address] [place], [place] [address]    Report Status 10/15/2023 FINAL  Final  Culture, blood (routine x  2)     Status: Abnormal   Collection Time: 10/10/23  4:33 AM   Specimen: BLOOD  Result Value Ref Range Status   Specimen Description   Final    BLOOD BLOOD RIGHT HAND Performed at [facility], [address] [place], [place] [address]    Special Requests   Final    BOTTLES DRAWN AEROBIC AND ANAEROBIC Blood Culture adequate volume Performed at [facility], [address], [place], [place] [address]    Culture  Setup Time   Final    GRAM NEGATIVE RODS Gram Stain Report Called to,Read Back By and Verified With:  REYNOLDS,M @ 0118 ON 10/11/23 BY JUW AEROBIC BOTTLE ONLY GS DONE @ [facility] CRITICAL RESULT CALLED TO, READ BACK BY AND VERIFIED WITH: M REYNOLDS,RN@0447  10/11/23 MK Performed at [facility], [address] [place], [place] [address]    Culture PSEUDOMONAS AERUGINOSA (A)  Final   Report Status 10/13/2023 FINAL  Final   Organism ID, Bacteria PSEUDOMONAS AERUGINOSA  Final      Susceptibility   Pseudomonas aeruginosa - MIC*    CEFTAZIDIME <=1 SENSITIVE Sensitive     CIPROFLOXACIN <=0.25 SENSITIVE Sensitive     GENTAMICIN <=1 SENSITIVE Sensitive     IMIPENEM <=0.25 SENSITIVE Sensitive     PIP/TAZO <=4 SENSITIVE Sensitive ug/mL    CEFEPIME 2 SENSITIVE Sensitive     * PSEUDOMONAS AERUGINOSA  Blood Culture ID Panel (Reflexed)     Status: Abnormal   Collection Time: 10/10/23  4:33 AM  Result Value Ref Range Status   Enterococcus faecalis NOT DETECTED NOT DETECTED Final   Enterococcus Faecium NOT DETECTED NOT DETECTED Final   Listeria monocytogenes NOT DETECTED NOT DETECTED Final   Staphylococcus species NOT DETECTED NOT DETECTED Final   Staphylococcus aureus (BCID) NOT DETECTED NOT DETECTED Final   Staphylococcus epidermidis NOT DETECTED NOT DETECTED Final   Staphylococcus lugdunensis NOT DETECTED NOT DETECTED Final   Streptococcus species NOT DETECTED NOT DETECTED Final   Streptococcus agalactiae NOT DETECTED NOT DETECTED Final   Streptococcus pneumoniae NOT DETECTED NOT DETECTED Final   Streptococcus pyogenes NOT DETECTED NOT DETECTED Final   A.calcoaceticus-baumannii NOT DETECTED NOT DETECTED Final   Bacteroides fragilis NOT DETECTED NOT DETECTED Final   Enterobacterales NOT DETECTED NOT DETECTED Final   Enterobacter cloacae complex NOT DETECTED NOT DETECTED Final   Escherichia coli NOT DETECTED NOT DETECTED Final   Klebsiella aerogenes NOT DETECTED NOT DETECTED Final   Klebsiella oxytoca NOT DETECTED NOT DETECTED Final   Klebsiella pneumoniae NOT DETECTED NOT DETECTED Final    Proteus species NOT DETECTED NOT DETECTED Final   Salmonella species NOT DETECTED NOT DETECTED Final   Serratia marcescens NOT DETECTED NOT DETECTED Final   Haemophilus influenzae NOT DETECTED NOT DETECTED Final   Neisseria meningitidis NOT DETECTED NOT DETECTED Final   Pseudomonas aeruginosa DETECTED (A) NOT DETECTED Final    Comment: CRITICAL RESULT CALLED TO, READ BACK BY AND VERIFIED WITH: M REYNOLDS,RN@0447  10/11/23 MK    Stenotrophomonas maltophilia NOT DETECTED NOT DETECTED Final   Candida albicans NOT DETECTED NOT DETECTED Final   Candida auris NOT DETECTED NOT DETECTED Final   Candida glabrata NOT DETECTED NOT DETECTED Final   Candida krusei NOT DETECTED NOT DETECTED Final   Candida parapsilosis NOT DETECTED NOT DETECTED Final   Candida tropicalis NOT DETECTED NOT DETECTED Final   Cryptococcus neoformans/gattii NOT DETECTED NOT DETECTED Final   CTX-M ESBL NOT DETECTED NOT DETECTED Final   Carbapenem resistance IMP NOT DETECTED  NOT DETECTED Final   Carbapenem resistance KPC NOT DETECTED NOT DETECTED Final   Carbapenem resistance NDM NOT DETECTED NOT DETECTED Final   Carbapenem resistance VIM NOT DETECTED NOT DETECTED Final    Comment: Performed at [facility], [address] [place], [place] [address]  SARS Coronavirus 2 by RT PCR (hospital order, performed in [facility] hospital lab) *cepheid single result test* Anterior Nasal Swab     Status: None   Collection Time: 10/10/23  6:41 AM   Specimen: Anterior Nasal Swab  Result Value Ref Range Status   SARS Coronavirus 2 by RT PCR NEGATIVE NEGATIVE Final    Comment: (NOTE) SARS-CoV-2 target nucleic acids are NOT DETECTED.  The SARS-CoV-2 RNA is generally detectable in upper and lower respiratory specimens during the acute phase of infection. The lowest concentration of SARS-CoV-2 viral copies this assay can detect is 250 copies / mL. A negative result does not preclude SARS-CoV-2 infection and should not be used as  the sole basis for treatment or other patient management decisions.  A negative result may occur with improper specimen collection / handling, submission of specimen other than nasopharyngeal swab, presence of viral mutation(s) within the areas targeted by this assay, and inadequate number of viral copies (<250 copies / mL). A negative result must be combined with clinical observations, patient history, and epidemiological information.  Fact Sheet for Patients:   RoadLapTop.co.za  Fact Sheet for Healthcare Providers: http://kim-miller.com/  This test is not yet approved or  cleared by the [place] FDA and has been authorized for detection and/or diagnosis of SARS-CoV-2 by FDA under an Emergency Use Authorization (EUA).  This EUA will remain in effect (meaning this test can be used) for the duration of the COVID-19 declaration under Section 564(b)(1) of the Act, 21 U.S.C. section 360bbb-3(b)(1), unless the authorization is terminated or revoked sooner.  Performed at [facility], [address], [place], [place] [address]   Respiratory (~20 pathogens) panel by PCR     Status: None   Collection Time: 10/10/23  1:08 PM   Specimen: Nasopharyngeal Swab; Respiratory  Result Value Ref Range Status   Adenovirus NOT DETECTED NOT DETECTED Final   Coronavirus 229E NOT DETECTED NOT DETECTED Final    Comment: (NOTE) The Coronavirus on the Respiratory Panel, DOES NOT test for the novel  Coronavirus (2019 nCoV)    Coronavirus HKU1 NOT DETECTED NOT DETECTED Final   Coronavirus NL63 NOT DETECTED NOT DETECTED Final   Coronavirus OC43 NOT DETECTED NOT DETECTED Final   Metapneumovirus NOT DETECTED NOT DETECTED Final   Rhinovirus / Enterovirus NOT DETECTED NOT DETECTED Final   Influenza A NOT DETECTED NOT DETECTED Final   Influenza B NOT DETECTED NOT DETECTED Final   Parainfluenza Virus 1 NOT DETECTED NOT DETECTED Final   Parainfluenza Virus 2 NOT  DETECTED NOT DETECTED Final   Parainfluenza Virus 3 NOT DETECTED NOT DETECTED Final   Parainfluenza Virus 4 NOT DETECTED NOT DETECTED Final   Respiratory Syncytial Virus NOT DETECTED NOT DETECTED Final   Bordetella pertussis NOT DETECTED NOT DETECTED Final   Bordetella Parapertussis NOT DETECTED NOT DETECTED Final   Chlamydophila pneumoniae NOT DETECTED NOT DETECTED Final   Mycoplasma pneumoniae NOT DETECTED NOT DETECTED Final    Comment: Performed at [facility], [address] [place], [place] [address]  MRSA Next Gen by PCR, Nasal     Status: None   Collection Time: 10/11/23  5:30 AM   Specimen: Nasal Mucosa; Nasal  Swab  Result Value Ref Range Status   MRSA by PCR Next Gen NOT DETECTED NOT DETECTED Final    Comment: (NOTE) The GeneXpert MRSA Assay (FDA approved for NASAL specimens only), is one component of a comprehensive MRSA colonization surveillance program. It is not intended to diagnose MRSA infection nor to guide or monitor treatment for MRSA infections. Test performance is not FDA approved in patients less than 12 years old. Performed at [facility], [address] [place], [place] [address]   SARS Coronavirus 2 by RT PCR (hospital order, performed in [facility] hospital lab) *cepheid single result test*     Status: None   Collection Time: 10/14/23 12:10 PM  Result Value Ref Range Status   SARS Coronavirus 2 by RT PCR NEGATIVE NEGATIVE Final    Comment: (NOTE) SARS-CoV-2 target nucleic acids are NOT DETECTED.  The SARS-CoV-2 RNA is generally detectable in upper and lower respiratory specimens during the acute phase of infection. The lowest concentration of SARS-CoV-2 viral copies this assay can detect is 250 copies / mL. A negative result does not preclude SARS-CoV-2 infection and should not be used as the sole basis for treatment or other patient management decisions.  A negative result may occur with improper specimen collection / handling, submission of  specimen other than nasopharyngeal swab, presence of viral mutation(s) within the areas targeted by this assay, and inadequate number of viral copies (<250 copies / mL). A negative result must be combined with clinical observations, patient history, and epidemiological information.  Fact Sheet for Patients:   RoadLapTop.co.za  Fact Sheet for Healthcare Providers: http://kim-miller.com/  This test is not yet approved or  cleared by the [place] FDA and has been authorized for detection and/or diagnosis of SARS-CoV-2 by FDA under an Emergency Use Authorization (EUA).  This EUA will remain in effect (meaning this test can be used) for the duration of the COVID-19 declaration under Section 564(b)(1) of the Act, 21 U.S.C. section 360bbb-3(b)(1), unless the authorization is terminated or revoked sooner.  Performed at [facility], [address], [place], [place] [address]     Labs: CBC: Recent Labs  Lab 10/10/23 0326 10/11/23 0642 10/13/23 0532  WBC 4.1 4.9 6.7  NEUTROABS 2.8  --   --   HGB 10.5* 10.7* 10.4*  HCT 34.2* 34.2* 33.6*  MCV 95.3 94.5 92.3  PLT 131* 146* 161   Basic Metabolic Panel: Recent Labs  Lab 10/10/23 0326 10/11/23 0642 10/13/23 0532 10/14/23 0354  NA 136 136 131* 135  K 4.4 4.4 3.9 3.9  CL 94* 88* 90* 94*  CO2 38* 40* 34* 33*  GLUCOSE 103* 87 84 84  BUN 18 20 19 22   CREATININE 0.69 0.88 1.01* 0.82  CALCIUM 8.6* 8.9 8.8* 9.0  MG  --  2.1  --   --   PHOS  --  3.6  --   --    Liver Function Tests: Recent Labs  Lab 10/10/23 0326  AST 32  ALT 25  ALKPHOS 81  BILITOT 0.6  PROT 6.1*  ALBUMIN 3.1*   CBG: Recent Labs  Lab 10/11/23 0026  GLUCAP 110*    Discharge time spent: greater than 30 minutes.  Signed: Catarina Hartshorn, MD [facility] 10/16/2023

## 2023-10-16 NOTE — Plan of Care (Signed)
  Problem: Education: Goal: Knowledge of General Education information will improve Description Including pain rating scale, medication(s)/side effects and non-pharmacologic comfort measures Outcome: Progressing   Problem: Health Behavior/Discharge Planning: Goal: Ability to manage health-related needs will improve Outcome: Progressing   

## 2023-10-16 NOTE — TOC Transition Note (Signed)
Transition of Care [facility]) - Discharge Note   Patient Details  Name: Danielle Harmon MRN: [id number] Date of Birth: 08/27/1941  [facility]) CM/SW Contact:  Villa Herb, LCSWA Phone Number: 10/16/2023, 11:14 AM  Clinical Narrative:    CSW spoke to EMS who states pt is on the list for today. CSW provided RN with room and report numbers. Updated D/C summary sent to facility admissions. Med necessity sent to floor for RN. CSW updated pts daughter of EMS arrival and pts D/C. TOC signing off.   Final next level of care: Skilled Nursing Facility Barriers to Discharge: Barriers Resolved   Patient Goals and CMS Choice Patient states their goals for this hospitalization and ongoing recovery are:: Go to SNF CMS Medicare.gov Compare Post Acute Care list provided to:: Patient Choice offered to / list presented to : Patient [place] ownership interest in [facility].provided to:: Patient    Discharge Placement                Patient to be transferred to facility by: EMS Name of family member notified: Daughter Patient and family notified of of transfer: 10/16/23  Discharge Plan and Services Additional resources added to the After Visit Summary for   In-house Referral: Clinical Social Work Discharge Planning Services: CM Consult Post Acute Care Choice: Skilled Nursing Facility                               Social Drivers of Health (SDOH) Interventions SDOH Screenings   Food Insecurity: No Food Insecurity (10/10/2023)  Housing: Low Risk  (10/10/2023)  Transportation Needs: No Transportation Needs (10/10/2023)  Utilities: Not At Risk (10/10/2023)  Alcohol Screen: Low Risk  (07/10/2023)  Depression (PHQ2-9): Low Risk  (08/18/2023)  Financial Resource Strain: Low Risk  (08/17/2023)  Physical Activity: Unknown (08/17/2023)  Recent Concern: Physical Activity - Inactive (07/10/2023)  Social Connections: Unknown (10/10/2023)  Stress: No Stress Concern Present  (08/17/2023)  Tobacco Use: Medium Risk (10/10/2023)  Health Literacy: Adequate Health Literacy (07/10/2023)     Readmission Risk Interventions    10/10/2023   12:17 PM  Readmission Risk Prevention Plan  Medication Screening Complete  Transportation Screening Complete

## 2023-10-16 NOTE — Progress Notes (Signed)
RN reports that when she walked in to check on patient she had taken the CPAP mask off and was without oxygen. Patient did not want to wear CPAP any longer. Back on 4 lpm nasal cannula.

## 2023-10-17 NOTE — Consult Note (Signed)
[facility] Liaison Note  10/17/2023  Danielle Harmon May 01, 1941 [id number]  Location: RN Hospital Liaison screened the patient remotely at [facility].  Insurance: Medicare   [name] is a 83 y.o. female who is a Primary Care Patient of Daphine Deutscher, Geologist, engineering, FNP [facility]. The patient was screened for readmission hospitalization with noted low risk score for unplanned readmission risk with 2 IP in 6 months.  The patient was assessed for potential Care Management service needs for post hospital transition for care coordination. Review of patient's electronic medical record reveals patient was admitted with Stroke like episode. Pt transitioned to [facility] in [place]. Facility will continue to address pt's ongoing needs.    [facility] Care Management/Population Health does not replace or interfere with any arrangements made by the Inpatient Transition of Care team.   For questions contact:   Elliot Cousin, RN, [facility] Liaison [place]   [facility], Population Health Office Hours MTWF  8:00 am-6:00 pm Direct Dial: [phone] mobile [phone] [Office toll free line] Office Hours are M-F 8:30 - 5 pm [email or web address]

## 2023-10-27 NOTE — Discharge Summary (Signed)
 "     [facility] Discharge Summary         Admit date:    10/22/2023 Discharge date:   October 27, 2023 Length of stay:    LOS: 5 days     Discharge Service:   [facility] Discharge Attending Physician: Norleen Oneil Clause, MD Discharge to:    To Skilled Nursing Facility Condition at Discharge:  good Code Status:    DNR and DNI Primary Care Provider:  Gladis Ronal Quant, NP  Consults  Psychiatry  Discharge Diagnoses  Principal Problem:   Acute metabolic encephalopathy Active Problems:   Chronic respiratory failure with hypoxia (CMS-HCC)   Chronic obstructive pulmonary disease (CMS-HCC)   Essential hypertension with goal blood pressure less than 130/80   Supplemental oxygen dependent   Left renal mass   Hallucinations   Hospital Course   Psychiatry  Procedures CT head no acute findings  Diagnoses Hyperactive Delirium Hallucinations Chronic Respiratory Failure on 4-6L [place] oxygen COPD without acute exacerbation Hypertension Left Renal Mass  Hospital Course  57 F presented to [facility], sent from [facility] skilled nursing facility.  She is on 6 L oxygen at baseline and they were concerned because her CO2 was greater than 40, she was confused, refusing all meds, and sleeping.  Had negative UA from yesterday.  Oxygen was turned down to 2 L at SNF this morning while awaiting EMS and remained 94-96%.   History obtained from review of recent hospitalization at [facility] health facility [facility] in [place], and from information supplied by the patient's daughter.  She normally lives independently in [place], [place] .  About a month ago she started getting confused, which is unusual for her.  On January 15 she called her son and said that the TV was talking to her.  She was hospitalized January 17-23 as mentioned above for concerns of a stroke.  She complained of right sided numbness.  Her stroke evaluation was negative including Head  CT, MRI, MRA.  Carotid ultrasound less than 49% stenosis.  Echo negative for intra-atrial shunt.  There was some suspicion of TIA so neurology recommended DAPT x 3 weeks then aspirin  monotherapy alone.  Of note, she also had Pseudomonas bacteremia of unclear origin treated with ceftriaxone ,, then Zosyn , followed by 7 days of Cipro  which her daughter said ended 2 or 3 days ago.  Patient went to rehab with worsening symptoms consistent with delirium -> sent here for further workup. Workup here: CT head negative, blood/urine culture negative, TSH wnl, resp panel neg, procal normal and VBG with elevated pCO2. Psychiatry consulted on admit -> melatonin/trazodone initiated and seroquel dose adjusted to 25 mg TID. If drowsiness experienced will consider reducing to BID dosing.  With known hx of COPD nasal cannula oxygen adjusted with goal O2 saturations of 88-92%. On 1/31 oxygen reduced to 2L [place] oxygen based on O2 saturations around 95%. She developed confusion/lethargy -> ABG performed on 2L pH 7.41/73/49. [place] oxygen adjusted to 4L with improvement in mentation. She will likely need continuous oxygen of 4L at rest and likely 6-7L with activity. pCO2 elevation likely chronic in setting of COPD.  Currently patient on seroquel BID scheduled dosing with PRN as needed, delirium precautions, trazodone PRN and melatonin scheduled for sleep. If ongoing hallucinations during the daytime consider increasing seroquel to 25 mg TID.   Pending Test Results   Pending Labs     Order Current Status   COVID-19 PCR In process  Blood Culture #1 Preliminary result   Blood Culture #2 Preliminary result       Procedures  None  Discharge Medications   Current Discharge Medication List     START taking these medications   Details  acetaminophen  (TYLENOL ) 325 MG tablet Take 2 tablets (650 mg total) by mouth every four (4) hours as needed.    guaiFENesin  (ROBITUSSIN) 100 mg/5 mL syrup Take 10 mL (200 mg total) by mouth  every four (4) hours as needed for cough.    melatonin 3 mg Tab Take 1 tablet (3 mg total) by mouth nightly.    !! QUEtiapine (SEROQUEL) 25 MG tablet Take 1 tablet (25 mg total) by mouth two (2) times a day. Qty: 60 tablet, Refills: 0    traZODone (DESYREL) 50 MG tablet Take 1 tablet (50 mg total) by mouth nightly as needed for sleep.     !! - Potential duplicate medications found. Please discuss with provider.     CONTINUE these medications which have CHANGED   Details  clopidogrel  (PLAVIX ) 75 mg tablet Take 1 tablet (75 mg total) by mouth daily for 4 days. Qty: 4 tablet, Refills: 0    guaiFENesin  (MUCINEX ) 600 mg 12 hr tablet Take 1 tablet (600 mg total) by mouth two (2) times a day for 5 days.       CONTINUE these medications which have NOT CHANGED   Details  albuterol  HFA 90 mcg/actuation inhaler     amLODIPine  (NORVASC ) 5 MG tablet Take 1 tablet (5 mg total) by mouth daily.    aspirin  81 MG chewable tablet Chew 1 tablet (81 mg total) daily.    atorvastatin  (LIPITOR) 40 MG tablet Take 1 tablet (40 mg total) by mouth nightly.    fluticasone  propion-salmeterol (ADVAIR, WIXELA) 250-50 mcg/dose diskus Inhale 1 puff two (2) times a day.    furosemide  (LASIX ) 20 MG tablet Take 1 tablet (20 mg total) by mouth daily.    pantoprazole  (PROTONIX ) 20 MG tablet Take 2 tablets (40 mg total) by mouth daily before breakfast.    !! QUEtiapine (SEROQUEL) 25 MG tablet Take 0.5 tablets (12.5 mg total) by mouth Three (3) times a day as needed.    triamcinolone  (NASACORT ) 55 mcg nasal inhaler INSTILL ONE SPRAY BY NASAL ROUTE 2 (TWO) TIMES DAILY.    budesonide-glycopyr-formoterol  (BREZTRI  AEROSPHERE) 160-9-4.8 mcg/actuation HFAA Inhale.    diclofenac  sodium (VOLTAREN ) 1 % gel Apply 2 g topically four (4) times a day.    KLOR-CON  M10 10 mEq CR tablet TAKE ONE TABLET (10 MEQ TOTAL) BY MOUTH 2 (TWO) TIMES DAILY.    predniSONE  (DELTASONE ) 10 MG tablet Take 5 mg by mouth daily.     !! -  Potential duplicate medications found. Please discuss with provider.     STOP taking these medications     haloperidol (HALDOL) 5 MG tablet Comments:  Reason for Stopping:       omeprazole  (PRILOSEC) 20 MG capsule Comments:  Reason for Stopping:          Discharge Instructions   Diet Instructions     Discharge diet (specify)     Discharge Nutrition Therapy: Regular      Activity Instructions     Activity as tolerated        Other Instructions     Call MD for:  difficulty breathing, headache or visual disturbances     Call MD for:  severe uncontrolled pain     Call MD for: Temperature > 38.5  Celsius ( > 101.3 Fahrenheit)     Discharge instructions     Plan 4L Nasal cannula oxygen at rest and consider 6-7L [place] oxygen with activity. Seroquel was changed to 25 mg twice daily and tolerated. If develops recurrent hallucinations during the day time would consider TID dosing. Melatonin scheduled and trazodone as needed for sleep. In regards to aspirin /plavix  -> based on prior neurology notes would continue DAPT until 10/31/2023 then change to monotherapy with aspirin  indefinitely. Return with worsening symptoms. Plan discharge to rehab today.     Follow Up instructions and Outpatient Referrals    Call MD for:  difficulty breathing, headache or visual disturbances     Call MD for:  severe uncontrolled pain     Call MD for: Temperature > 38.5 Celsius ( > 101.3 Fahrenheit)     Discharge instructions         Medical History   Past Medical History:  Diagnosis Date   COPD (chronic obstructive pulmonary disease) (CMS-HCC)    Hypertension     Lab Results   Recent Labs    Units 10/22/23 0513 10/22/23 1241 10/23/23 0419 10/26/23 0555  WBC 10*9/L 6.6  6.4  --  6.5 5.7  HGB g/dL 9.9*  9.8* 89.5* 9.6* 9.4*  HCT % 30.4*  31.0*  --  29.2* 28.5*  PLT 10*9/L 260  273  --  258 254   Recent Labs    Units 10/22/23 0513 10/22/23 1241 10/22/23 1254 10/23/23 0419  10/26/23 0555  NA mmol/L 146*  --  142 145 144  K mmol/L 4.4  --  4.4 4.4 4.0  CL mmol/L 96*  --  100 103 102  CO2 mmol/L >40.0*  --  44.0* 42.0* 43.7*  BUN mg/dL 30*  --  28* 28* 25*  CREATININE mg/dL 9.11  --  9.16* 9.21 9.07*  GLU mg/dL 62*  --  89 898 78  CALCIUM  mg/dL 8.9  --  9.1 9.0 8.5*  ALBUMIN g/dL  --   --  2.9*  --   --   PROT g/dL  --   --  6.8  --   --   BILITOT mg/dL  --   --  0.6  --   --   AST U/L  --   --  22  --   --   ALT U/L  --   --  30  --   --   ALKPHOS U/L  --   --  98  --   --   MG mg/dL 1.9  --   --   --  1.8  PHOS mg/dL 3.4  --   --   --   --   LACTATE mmol/L  --  0.6  --   --   --    Recent Labs    Units 10/22/23 0513  TROPONINI ng/L 8   No results for input(s): INR, LABPROT, APTT, DDIMER in the last 168 hours. Recent Labs    Units 10/22/23 0513  TSH uIU/mL 4.016    No results for input(s): HEPAIGM, HEPBSAG, HEPBIGM, HEPCAB, MITOAB in the last 168 hours. Recent Labs    Units 10/21/23 0919 10/22/23 1252  WBCUA /HPF 1 <1  NITRITE  Negative Negative  LEUKOCYTESUR  Negative Negative  BACTERIA /HPF None Seen None Seen  RBCUA /HPF 1 <1  BLOODU  Negative Negative  GLUCOSEU  Negative Negative  PROTEINUA  30 mg/dL* Trace*  KETONESU  Negative Trace*   No results  for input(s): KETUR, PREGTESTUR, PREGPOC, NAURINE, LABOSMO, OSMOFT, PROTEINUR, CREATUR, PCRATIOUR, OPIAU, BENZU, TRICYCLIC, PCPU, AMPHU, COCAU, CANNAU, BARBU, URPROTELEC in the last 168 hours. No results for input(s): FTYP1, WBCFLUID, FNEUT, LYMPHSFL, FMONO, EOSFL, RBCFL, CLARITYFLUID, COLORFL in the last 168 hours. No results for input(s): O2SOUR, FIO2ART, PHART, PCO2ART, PO2ART, HCO3ART, O2SATART, BEART in the last 72 hours.  Imaging  No results found.   Time Spent on Discharge  I spent less than 30 mins in the discharge of this patient.         "

## 2023-11-12 ENCOUNTER — Other Ambulatory Visit: Payer: Self-pay | Admitting: Nurse Practitioner

## 2023-11-12 DIAGNOSIS — I10 Essential (primary) hypertension: Secondary | ICD-10-CM

## 2023-11-27 NOTE — Discharge Summary (Signed)
 [facility]  PATIENT INTERDISCIPLINARY INSTRUCTION SHEET / DISCHARGE SUMMARY  Patient Name / DOB: Danielle Harmon  [birth year]   Date of Admission to SNF: 10/16/2023   Date of Discharge: 11/27/2023  Primary Diagnoses requiring skilled care: COPD with chronic hypoxic respiratory failure  Course of Treatment: Pt received extensive PT, OT and rehab nursing and was medically managed during stay.    Dry, productive cough  -appears to be chronic in nature given hx of respiratory failure and shortness of breath. Patient states that she sometimes brings up clear mucus. Continue Robitussin PRN - 2/25 Stable    Edema to bilateral feet and lower legs  +2 pitting to right; +3 pitting to left - tubi grips in place.  -Will increase Lasix  to 20mg  BID for 3 days then restart 20mg  daily and recheck BMP on Monday. Encouraged to elevate feet - 2/18 Much improvement in edema - will restart Lasix  20 mg tomorrow - 2/21 SBAR report received from nursing staff for concern of worsening edema. +2 edema to right; +1 edema to left upon assessment. BP on the softer side, would not consider further diuresis at this time. Continue tubi grips and elevation. 2/25     Anemia Hgb down to 7.2 on 2/14 - ordering hemoccult stools x 3 - aspirin  is being held and already on Protonix  - 2/14 Repeat Hgb 7.0 today. Had + fecal occult on 2/16. Will continue with serial hemoccult tests and recheck CBC on Monday and Protonix  increased to BID. Consulting Civil Engineer in communication with patients family regarding goals of care and next steps. Continue to hold aspirin  - 2/18  no other stools have been hemocculted at this time - ordered again - labs Monday - 2/21 Hgb 7.1 on 2/24 - no additional hemoccult results available at this time - dtr does not want a GI work-up at this time - will monitor - 2/25   Acute metabolic encephalopathy with hallucinations/Delirium / resent stroke.  - reviewed H&P. Hx of chronic  resp failure on 4-6L continuous oxygen at baseline.   DAPT x 3 weeks then aspirin  monotherapy thereafter. Also dx with pseudomonas bacteremia s/p Abx treatment - Multifactorial  - Infectious workup neg.  TSH WNL -  1/31 -> ABG obtained 7.41/73/49 -> [place] oxygen at 2L at time of ABG -> No Bipap done at Hospital - avoid hyper oxygenation. Encouraged deep breaths and increase ventilation - Psychiatry -> rec seroquel scheduled/PRN, trazodone and melatonin.  -  Plan to change seroquel to 12.5 mg BID to avoid oversedation, will also provide prn.  Doing well on the above dose of seroquel - no behavioral issues today at visit - very cooperative - She is A&O x 4 today - 2/4 Report from nurse that she was awake most of the night yelling out - will increase night dose of Seroquel to 25 mg to help her sleep - 2/7 Stable - no report of patient not sleeping or yelling out at night - will continue POC as above - 2/10 Sleeping well at night - she believes she is at home today - will order labs for tomorrow - 2/12 Oriented today - 2/14 Alert and oriented x3 today. During conversation, she is noted to be calling the name of a man but is unable to tell me who he is. Sleeping well throughout the night. CO2 still noted to be >40 on most recent panel, which is unchanged from previous hospitalization. Will repeat BMP on Monday and reevaluate-  2/18   Remains SOB at times - will discuss possible use of Roxanol for SOB next week with dtr after labs obtained - on [facility] today with sats from 86 - 94% - 2/21 A/O x 3. Reports shortness of breath that is unchanged from baseline. Continue with baseline O2. - 2/25  Agitation noted today - Very SOB with activity - adding Roxanol 3 times a day and 2 other times PRN for SOB and increase work of breathing - family in agreement - 3/4 Roxanol working very well to help with her air hunger - she continues to remain agitated at times and has PRN seroquel along with scheduled - 3/5   COPD with  chronic hypoxic respiratory failure  - Baseline 4-6L, need to wean oxygen goal O2 saturations 88-92% in setting of COPD - Procalc neg, cultures NGTD On baseline 4L    TIA on prior admit - Planned DAPT x 21 days then aspiring monotherapy. Reviewed prior discharge summary. Discharged 10/16/23 with 15 days left on DAPT - Stop plavix  around 2/7 then aspirin  therapy alone indefinitely Had to stop ASA due to low hgb of 7    Hypertension - BP controlled Hypotensive off and on - BP's last 48 hours 90/55, 116/59 - holding amlodipine  for now - 2/12 Stable - will continue to hold amlodipine  - 2/14 Stable - 135/60, 103/67, 139/67 - 2/21 Stable    Left renal mass - Exophytic left renal lesion on CT January 18 at [facility] health.  Felt to be proteinaceous cyst.   - Consider outpatient workup  Discharge Medications: See After Visit Summary - She will be discharging home onto hospice - sending her home on Roxanol and Ativan (sent to pharmacy) - patient's family instructed on administration of medications by nursing.   Daughter aware that this NP is concerned about transporting home in personal vehicle in her medical condition. Daughter states she understands and that her brother is aware of the risks and is going to drive her home. Patient made it clear to family at earlier time that she wanted to die at home. Family is trying to fulfill her wishes.    Condition at Discharge:  Stable []  Improved []  Guarded [x]   Participation Activity Progress:  Return to Work: No     Return to Driving: No    Leisure Activities: Yes     Discharge Location:  Home with Hospice  [x]  Home with Family [x]  ALF  []  ILF   []  SNF []    I certify that Hibah Odonnell is confined to his/her home and needs intermittent skilled nursing care, physical therapy and/or speech therapy or continues to need occupational therapy. The patient is under my care, and I have authorized services on this plan of care and will periodically review the plan.  The patient had a face-to-face encounter with an allowed provider type on 11/27/2023 and the encounter was related to the primary reason for home health care.  Greater than  30  minutes spent on discharge process including conducting physical examination, providing patient counseling, reconciling medications and writing referrals.   Tammy C Stancil, AGNP

## 2023-11-28 ENCOUNTER — Telehealth: Payer: Self-pay

## 2023-11-28 ENCOUNTER — Telehealth: Payer: Self-pay | Admitting: Family Medicine

## 2023-12-04 ENCOUNTER — Ambulatory Visit: Payer: Medicare Other | Admitting: Emergency Medicine

## 2023-12-23 NOTE — Telephone Encounter (Signed)
 Call received at:7:36 notifying that Danielle Harmon was found deceased at home by family.  Last known well 5:30 am. no evidence of foul play.  EMS TOD 7:13  Past Medical History:  Diagnosis Date   COPD (chronic obstructive pulmonary disease) (HCC)    GERD (gastroesophageal reflux disease)    Hypertension     DNR and was expected to pass.  Okay to release patient's body.  Will forward to PCP for completion of death certificate.

## 2023-12-23 NOTE — Telephone Encounter (Signed)
 Copied from CRM [id number]. Topic: General - Deceased Patient >> 2023/12/26 12:52 PM Clayton Bibles wrote: Name of caller: Mariel Kansky The case ID # [id number]  Date of death: 2023/12/26   Name of funeral home: [facility]  Phone number of funeral home: [phone]  Provider that needs to sign form: Mary-Margaret Daphine Deutscher FNP  Timeline for signing: As soon as possible

## 2023-12-23 DEATH — deceased

## 2024-01-05 ENCOUNTER — Inpatient Hospital Stay: Payer: Medicare Other | Admitting: Neurology

## 2024-02-20 ENCOUNTER — Ambulatory Visit: Payer: Medicare Other | Admitting: Nurse Practitioner
